# Patient Record
Sex: Female | Born: 1945
Health system: Southern US, Community
[De-identification: ages and names within clinical notes are randomized; demographics above are authoritative.]

## PROBLEM LIST (undated history)

## (undated) DIAGNOSIS — Z9849 Cataract extraction status, unspecified eye: Secondary | ICD-10-CM

## (undated) DIAGNOSIS — Z9889 Other specified postprocedural states: Secondary | ICD-10-CM

## (undated) DIAGNOSIS — I209 Angina pectoris, unspecified: Secondary | ICD-10-CM

## (undated) DIAGNOSIS — M1990 Unspecified osteoarthritis, unspecified site: Secondary | ICD-10-CM

## (undated) DIAGNOSIS — Z8719 Personal history of other diseases of the digestive system: Secondary | ICD-10-CM

## (undated) DIAGNOSIS — K5792 Diverticulitis of intestine, part unspecified, without perforation or abscess without bleeding: Secondary | ICD-10-CM

## (undated) DIAGNOSIS — K529 Noninfective gastroenteritis and colitis, unspecified: Secondary | ICD-10-CM

## (undated) DIAGNOSIS — F329 Major depressive disorder, single episode, unspecified: Secondary | ICD-10-CM

## (undated) DIAGNOSIS — S42309A Unspecified fracture of shaft of humerus, unspecified arm, initial encounter for closed fracture: Secondary | ICD-10-CM

## (undated) DIAGNOSIS — K219 Gastro-esophageal reflux disease without esophagitis: Secondary | ICD-10-CM

## (undated) DIAGNOSIS — Z923 Personal history of irradiation: Secondary | ICD-10-CM

## (undated) DIAGNOSIS — I219 Acute myocardial infarction, unspecified: Secondary | ICD-10-CM

## (undated) DIAGNOSIS — I10 Essential (primary) hypertension: Secondary | ICD-10-CM

## (undated) DIAGNOSIS — C50919 Malignant neoplasm of unspecified site of unspecified female breast: Secondary | ICD-10-CM

## (undated) DIAGNOSIS — D649 Anemia, unspecified: Secondary | ICD-10-CM

## (undated) DIAGNOSIS — E119 Type 2 diabetes mellitus without complications: Secondary | ICD-10-CM

## (undated) DIAGNOSIS — R011 Cardiac murmur, unspecified: Secondary | ICD-10-CM

## (undated) DIAGNOSIS — D219 Benign neoplasm of connective and other soft tissue, unspecified: Secondary | ICD-10-CM

## (undated) DIAGNOSIS — C801 Malignant (primary) neoplasm, unspecified: Secondary | ICD-10-CM

## (undated) DIAGNOSIS — F32A Depression, unspecified: Secondary | ICD-10-CM

## (undated) HISTORY — PX: EYE SURGERY: SHX253

## (undated) HISTORY — DX: Cataract extraction status, unspecified eye: Z98.49

## (undated) HISTORY — PX: COLON RESECTION: SHX5231

## (undated) HISTORY — DX: Other specified postprocedural states: Z98.890

## (undated) HISTORY — PX: HERNIA REPAIR: SHX51

## (undated) HISTORY — DX: Personal history of other diseases of the digestive system: Z87.19

## (undated) HISTORY — DX: Diverticulitis of intestine, part unspecified, without perforation or abscess without bleeding: K57.92

## (undated) HISTORY — PX: CHOLECYSTECTOMY: SHX55

## (undated) HISTORY — DX: Acute myocardial infarction, unspecified: I21.9

## (undated) HISTORY — DX: Personal history of irradiation: Z92.3

## (undated) HISTORY — DX: Anemia, unspecified: D64.9

## (undated) HISTORY — PX: APPENDECTOMY: SHX54

## (undated) HISTORY — DX: Unspecified fracture of shaft of humerus, unspecified arm, initial encounter for closed fracture: S42.309A

## (undated) HISTORY — DX: Noninfective gastroenteritis and colitis, unspecified: K52.9

## (undated) HISTORY — DX: Benign neoplasm of connective and other soft tissue, unspecified: D21.9

---

## 1997-09-01 ENCOUNTER — Encounter (HOSPITAL_COMMUNITY): Admission: RE | Admit: 1997-09-01 | Discharge: 1997-11-30 | Payer: Self-pay | Admitting: Cardiology

## 1997-09-13 ENCOUNTER — Other Ambulatory Visit: Admission: RE | Admit: 1997-09-13 | Discharge: 1997-09-13 | Payer: Self-pay | Admitting: Obstetrics & Gynecology

## 1997-10-21 ENCOUNTER — Ambulatory Visit (HOSPITAL_COMMUNITY): Admission: RE | Admit: 1997-10-21 | Discharge: 1997-10-21 | Payer: Self-pay | Admitting: Cardiology

## 1999-06-04 HISTORY — PX: TOTAL ABDOMINAL HYSTERECTOMY: SHX209

## 1999-06-11 ENCOUNTER — Encounter: Payer: Self-pay | Admitting: Family Medicine

## 1999-06-11 ENCOUNTER — Encounter: Admission: RE | Admit: 1999-06-11 | Discharge: 1999-06-11 | Payer: Self-pay | Admitting: Family Medicine

## 1999-06-25 ENCOUNTER — Ambulatory Visit (HOSPITAL_COMMUNITY): Admission: RE | Admit: 1999-06-25 | Discharge: 1999-06-25 | Payer: Self-pay | Admitting: Gastroenterology

## 1999-08-08 ENCOUNTER — Ambulatory Visit (HOSPITAL_COMMUNITY): Admission: RE | Admit: 1999-08-08 | Discharge: 1999-08-08 | Payer: Self-pay | Admitting: Gastroenterology

## 1999-08-08 ENCOUNTER — Encounter (INDEPENDENT_AMBULATORY_CARE_PROVIDER_SITE_OTHER): Payer: Self-pay | Admitting: Specialist

## 1999-10-15 ENCOUNTER — Encounter: Admission: RE | Admit: 1999-10-15 | Discharge: 1999-10-15 | Payer: Self-pay | Admitting: Gastroenterology

## 1999-10-15 ENCOUNTER — Encounter: Payer: Self-pay | Admitting: Gastroenterology

## 1999-10-17 ENCOUNTER — Encounter: Admission: RE | Admit: 1999-10-17 | Discharge: 1999-10-17 | Payer: Self-pay | Admitting: Gastroenterology

## 1999-10-17 ENCOUNTER — Encounter: Payer: Self-pay | Admitting: Gastroenterology

## 1999-11-12 ENCOUNTER — Other Ambulatory Visit: Admission: RE | Admit: 1999-11-12 | Discharge: 1999-11-12 | Payer: Self-pay | Admitting: Obstetrics and Gynecology

## 1999-11-13 ENCOUNTER — Encounter (INDEPENDENT_AMBULATORY_CARE_PROVIDER_SITE_OTHER): Payer: Self-pay

## 1999-11-13 ENCOUNTER — Other Ambulatory Visit: Admission: RE | Admit: 1999-11-13 | Discharge: 1999-11-13 | Payer: Self-pay | Admitting: Obstetrics and Gynecology

## 1999-11-20 ENCOUNTER — Encounter: Admission: RE | Admit: 1999-11-20 | Discharge: 1999-11-20 | Payer: Self-pay | Admitting: Obstetrics and Gynecology

## 1999-11-20 ENCOUNTER — Encounter: Payer: Self-pay | Admitting: Obstetrics and Gynecology

## 1999-12-04 ENCOUNTER — Encounter: Payer: Self-pay | Admitting: General Surgery

## 1999-12-11 ENCOUNTER — Encounter (INDEPENDENT_AMBULATORY_CARE_PROVIDER_SITE_OTHER): Payer: Self-pay | Admitting: Specialist

## 1999-12-11 ENCOUNTER — Inpatient Hospital Stay (HOSPITAL_COMMUNITY): Admission: RE | Admit: 1999-12-11 | Discharge: 1999-12-16 | Payer: Self-pay | Admitting: Obstetrics and Gynecology

## 2000-04-02 ENCOUNTER — Encounter: Payer: Self-pay | Admitting: Cardiology

## 2000-04-02 ENCOUNTER — Encounter: Admission: RE | Admit: 2000-04-02 | Discharge: 2000-04-02 | Payer: Self-pay | Admitting: Cardiology

## 2000-04-07 ENCOUNTER — Ambulatory Visit (HOSPITAL_COMMUNITY): Admission: RE | Admit: 2000-04-07 | Discharge: 2000-04-07 | Payer: Self-pay | Admitting: Cardiology

## 2001-07-13 ENCOUNTER — Encounter: Admission: RE | Admit: 2001-07-13 | Discharge: 2001-07-13 | Payer: Self-pay | Admitting: Obstetrics and Gynecology

## 2001-07-13 ENCOUNTER — Encounter: Payer: Self-pay | Admitting: Obstetrics and Gynecology

## 2001-08-03 ENCOUNTER — Ambulatory Visit (HOSPITAL_COMMUNITY): Admission: RE | Admit: 2001-08-03 | Discharge: 2001-08-03 | Payer: Self-pay | Admitting: Gastroenterology

## 2001-08-03 ENCOUNTER — Encounter (INDEPENDENT_AMBULATORY_CARE_PROVIDER_SITE_OTHER): Payer: Self-pay

## 2001-08-25 ENCOUNTER — Other Ambulatory Visit: Admission: RE | Admit: 2001-08-25 | Discharge: 2001-08-25 | Payer: Self-pay | Admitting: Obstetrics and Gynecology

## 2001-09-22 ENCOUNTER — Encounter: Admission: RE | Admit: 2001-09-22 | Discharge: 2001-09-22 | Payer: Self-pay | Admitting: Obstetrics and Gynecology

## 2001-09-22 ENCOUNTER — Encounter: Payer: Self-pay | Admitting: Obstetrics and Gynecology

## 2003-01-20 ENCOUNTER — Encounter: Admission: RE | Admit: 2003-01-20 | Discharge: 2003-01-20 | Payer: Self-pay | Admitting: Family Medicine

## 2003-01-20 ENCOUNTER — Encounter: Payer: Self-pay | Admitting: Family Medicine

## 2003-01-26 ENCOUNTER — Encounter: Admission: RE | Admit: 2003-01-26 | Discharge: 2003-01-26 | Payer: Self-pay | Admitting: Obstetrics and Gynecology

## 2003-01-26 ENCOUNTER — Encounter: Payer: Self-pay | Admitting: Obstetrics and Gynecology

## 2003-04-22 ENCOUNTER — Inpatient Hospital Stay (HOSPITAL_COMMUNITY): Admission: RE | Admit: 2003-04-22 | Discharge: 2003-04-26 | Payer: Self-pay | Admitting: General Surgery

## 2003-05-03 ENCOUNTER — Encounter: Admission: RE | Admit: 2003-05-03 | Discharge: 2003-08-01 | Payer: Self-pay | Admitting: Family Medicine

## 2003-08-04 ENCOUNTER — Encounter: Admission: RE | Admit: 2003-08-04 | Discharge: 2003-11-02 | Payer: Self-pay | Admitting: Family Medicine

## 2004-04-17 ENCOUNTER — Encounter: Admission: RE | Admit: 2004-04-17 | Discharge: 2004-04-17 | Payer: Self-pay | Admitting: Obstetrics and Gynecology

## 2005-11-14 ENCOUNTER — Encounter: Admission: RE | Admit: 2005-11-14 | Discharge: 2005-11-14 | Payer: Self-pay | Admitting: Obstetrics and Gynecology

## 2006-10-02 ENCOUNTER — Encounter: Admission: RE | Admit: 2006-10-02 | Discharge: 2006-10-02 | Payer: Self-pay | Admitting: Cardiology

## 2006-10-31 ENCOUNTER — Ambulatory Visit (HOSPITAL_COMMUNITY): Admission: RE | Admit: 2006-10-31 | Discharge: 2006-10-31 | Payer: Self-pay | Admitting: Cardiology

## 2007-10-13 ENCOUNTER — Encounter: Admission: RE | Admit: 2007-10-13 | Discharge: 2007-10-13 | Payer: Self-pay | Admitting: Family Medicine

## 2009-06-03 DIAGNOSIS — S42309A Unspecified fracture of shaft of humerus, unspecified arm, initial encounter for closed fracture: Secondary | ICD-10-CM

## 2009-06-03 HISTORY — DX: Unspecified fracture of shaft of humerus, unspecified arm, initial encounter for closed fracture: S42.309A

## 2010-06-03 DIAGNOSIS — Z9849 Cataract extraction status, unspecified eye: Secondary | ICD-10-CM

## 2010-06-03 HISTORY — DX: Cataract extraction status, unspecified eye: Z98.49

## 2010-10-16 NOTE — Cardiovascular Report (Signed)
Elizabeth Hebert, Elizabeth Hebert           ACCOUNT NO.:  [id number]   MEDICAL RECORD NO.:  [id number]          PATIENT TYPE:  OIB   LOCATION:  2899                         FACILITY:  [facility]   PHYSICIAN:  Thereasa Solo. Little, M.D. DATE OF BIRTH:  12-27-1945   DATE OF PROCEDURE:  DATE OF DISCHARGE:                            CARDIAC CATHETERIZATION   BRIEF HISTORY:  This 65 year old female had a stent placed to her RCA in  1999.  She had a nuclear study 12/2005, that was negative for ischemia  and showed a 72% ejection fraction.   Over the last 6-8 weeks, she has had an increase in her dyspnea on  exertion.  If she walks up a flight of steps, she becomes markedly  breathless and occasionally has some associated chest discomfort.  It  takes about 5 minutes for her recover from this and this is a  substantial change in what she had experienced in the past.  Because of  this, she is brought in for outpatient cardiac catheterization.   DESCRIPTION OF PROCEDURE:  After obtaining informed consent, the patient  was prepped and draped in the usual sterile fashion exposing the right  groin.  Following local anesthetic with were sent Xylocaine the  Seldinger technique employed and a 5-French introducer sheath was placed  into the right femoral artery.  Left and right coronary arteriography  and ventriculography in both the RAO and LAO projection was performed.   COMPLICATIONS:  None.   MEDICATIONS:  1 mg IV Versed.   EQUIPMENT:  5-French Judkins configuration catheters and it required a  __________ catheter to cannulate the right coronary artery.   RESULTS:  1. Hemodynamic monitoring central aortic pressure was 151/79.  Left      ventricular pressure was 151/9 with no significant valve gradient      at the time of pullback.   Ventriculography:  Ventriculography was performed at the procedure both  the RAO and LAO ventriculogram was performed.  In the RAO projection,  the apex appeared to contract  appropriately but there appeared to be a  lateral wall aneurysm.  The LAO view.  This area was not well  visualized.  This would be a new finding.   Coronary arteriography:  1. Left main normal, it bifurcated.  2. Circumflex, the circumflex gave rise to large OM which was free of      disease.  3. Left anterior descending -  The LAD extended down to the heart and      only minimal irregularities of the proximal mid portion there to      small diagonals that were free of disease.  4. Right coronary artery - the right coronary densely calcified.      There was a stent in the mid portion of the RCA that was widely      patent, proximal to the stent was a tubular area about 10 mm in      length of about 30%.  The PDA and posterior lateral vessels were      free of disease.   CONCLUSION:  1. Mild disease in the RCA was widely patent  stent.  2. In the lateral wall aneurysm.   She will need an outpatient echo to look at this abnormality.  I will  try to readjust her medicines as an outpatient.  Home later today.           ______________________________  Thereasa Solo. Little, M.D.     ABL/MEDQ  D:  10/31/2006  T:  10/31/2006  Job:  [id number]   cc:   Donia Guiles, M.D.

## 2010-10-19 NOTE — Op Note (Signed)
[facility]  Patient:    Elizabeth Hebert, Elizabeth Hebert                  MRN: [id number] Proc. Date: 12/11/99 Adm. Date:  [id number] Attending:  Oliver Pila                           Operative Report  PREOPERATIVE DIAGNOSIS:  Chronic diverticulitis, diverticulosis, enlarged uterus.  POSTOPERATIVE DIAGNOSIS:  Chronic diverticulitis, diverticulosis, enlarged uterus.  OPERATION PERFORMED:  Total abdominal hysterectomy and bilateral oophorectomy and sigmoid colectomy with take-down of splenic flexure and lower anterior anastomosis.  SURGEON:  Timothy E. Earlene Plater, M.D.  ASSISTANT:  Donnie Coffin. Samuella Cota, M.D.  ANESTHESIA:  CRNA supervised M.D.  INDICATIONS FOR PROCEDURE:  The patient has been seen by Dr. Vilinda Boehringer, Dr. Donia Guiles and Dr. Caprice Kluver.  He has been followed for some time known diverticulosis, frequent attacks of diverticulitis and now disabling diarrhea and then pelvic pain.  After careful consideration, she is scheduled for sigmoid colectomy.  Because of a 20+ week uterus, gynecologic consultation has been obtained and hysterectomy is planned.  DESCRIPTION OF PROCEDURE:  The patient had been brought to the operating room by Dr. Huel Cote.  General endotracheal anesthesia had been administered and the patient underwent a total abdominal hysterectomy and bilateral salpingo-oophorectomy as described and noted by Dr. Senaida Ores.  At the completion of that procedure at approximately 9:30 I was called into the room.  The hysterectomy was complete and Dr. Senaida Ores was finished.  At that time the patient was stable and the counts were correct.  I enlarged the lower midline incision to just above the umbilicus, reapplied the abdominal retractors.  A preliminary evaluation of the upper abdomen revealed adhesions of the omentum to the right upper quadrant.  The liver was palpably free of disease.  The stomach was palpably normal.  The  nasogastric tube was rearranged.  The small bowel was normal in its entirely.  The right colon, transverse colon was thought to be normal as best could be told.  The descending colon was normal to the junction of the descending and sigmoid where diverticulosis was present and the sigmoid colon was coiled and adherent to the pelvis and very erythematous, very thickened, even felt like there was a tumor within the sigmoid colon and the pelvis.  The uterus, of course, had just been removed.  We began by dividing the peritoneum over the descending colon and sigmoid colon.  The ureters on both right and left sides were carefully identified and were well out of the operative field.  Blunt dissection was used to free the inflamed colon out of the pelvis and the upper rectum was felt to be free of disease.  With some difficulty, the mesentery was bluntly dissected.  Bleeding was carefully controlled with either ties, clips or cautery.  At the junction of the descending and sigmoid colon, the colon was divided between the GIA staple device.  We then used the distal sigmoid and then carefully divided the mesentery down to the upper rectum where the remaining involved and diseased sigmoid was divided from the rectum. The rectum, indeed was normal both by mucosal inspection and external inspection.  Bleeding had been controlled satisfactorily.  We did find one area on the vaginal cuff that was bleeding a small amount and we placed a figure-of-eight suture of #1 Vicryl.  Then we approached the descending colon and splenic flexure by  careful sharp and blunt dissection and took down the entire splenic flexure to allow for length of descending colon to reach the pelvis.  This was done again with some difficulty but we were able to manage and keep the entire colon intact, blood supply intact and the descending colon reached the pelvis without tension.  One area on the splenic flexure which had been touched  with the cautery was identified and imbricating sutures of 3-0 silk were applied to turn in that serosa.  We then began our anastomosis.  It was a handsewn one layer end-to-end anastomosis between the descending colon and rectum.  This was accomplished with 2-0 silks, carefully placed along the back row than all tied and then the front row and sides were sewn and sutured.  This appeared to be intact, air and watertight anastomosis.  It lay well down into the pelvis and was without tension.  There was some spillage of liquid stool.  This was copiously irrigated away.  The area was bathed in Betadine solution and then that was diluted and washed away as well.  The mesentery medially was closed with interrupted silk sutures.  The counts were correct.  The pelvis was irrigated with 3L of saline.  The viscera and omentum were replaced across the midabdomen.  Again, the counts were correct and the abdomen was closed in a single layer with #1 PDS suture.  The subcu, generous as it was was irrigated with 1L of saline and the skin was closed with wide skin staples.  Again, second counts and final counts were correct.  The patient was stable.  Total estimated blood loss was 500 cc.  None replaced and the patient was stable. Then an epidural was applied for postoperative anesthesia.  The patient was removed to recovery room in good condition. DD:  12/11/99 TD:  12/11/99 Job: [id number]

## 2010-10-19 NOTE — Procedures (Signed)
[facility]  Patient:    Elizabeth Hebert                   MRN: [id number] Proc. Date: 06/25/99 Adm. Date:  [id number] Attending:  Orland Mustard Dictator:   Llana Aliment Randa Evens, M.D. CC:         Desma Maxim, M.D.                           Procedure Report  PROCEDURE:  Colonoscopy with polypectomy and biopsy.  MEDICATIONS: 1. Fentanyl 75 mcg. 2. Versed 7.5 mg intravenously.  INDICATIONS:  The patient is a 65 year old woman with chronic diarrhea that has  been bloody going back to approximately two months with increased white count. She has had nocturnal stools.  She has a family history of Crohns disease.  Due to change in bowel habits, grossly bloody stools, and her age, colonoscopy is performed.  SCOPE:  Olympus adult video colonoscope, changing to the Olympus pediatric video colonoscope.  DESCRIPTION OF PROCEDURE:  The patient was explained to the patient and consent  obtained.  The patient was placed in the left lateral decubitus position.  The Olympus adult video colonoscope was inserted under direct visualization.  The patient had an obvious polyp in the rectum.  We advanced beyond that and there as an area of marked inflammation and edema that I was unable to pass with the adult scope.  We then switched over to the Olympus pediatric video colonoscope. Eventually, after placing her on her back, we were able to pass this area.  We ere able to advance fairly rapidly to the cecum.  The ileocecal valve seen, appeared normal.  The terminal ileum entered for a short distance and appeared normal. Scope withdrawn.  The cecum, ascending colon, hepatic flexure, transverse colon, splenic flexure, and descending colon seen well.  There was scattered diverticuli throughout the entire colon.  The descending colon, mid-descending colon there as an inflamed ulcerated area that lasted for approximately 10 cm.  It was localized, had  linear ulcerations, marked inflammation and edema.  This was biopsied and placed in jar #1.  It had the appearance of a focal colitis.  It did not appear to be associated with diverticuli.  There again was scattered diverticuli throughout the remainder of the descending and sigmoid colon to approximately 25 cm, at which point an ulcerated inflamed hot mass was seen that could have been polypoid, but the tip actually appeared to be a large polyp, but seemed to enter down into the stalk, and the stalk was broad-based, if in fact it was truly on the stalk.  It was hard and very friable.  It was not at all a typical pedunculated polyp.  I elected to biopsy the tip, and the tip again was hard and quite friable.  This was obliterating the lumen partially.  I contemplated attempting to remove this with the snare, but it seemed like the process involved what would have been the stalk down to the stalk, and I was not sure if we were dealing with inflammatory bowel disease or a neoplastic lesion.  The scope was withdrawn, and the rectosigmoid junction was endoscopically normal.  In the distal rectum a 1.5 cm sessile polyp was encountered, removed with the snare, and sucked up against the scope. There were internal hemorrhoids.  The scope was withdrawn.  The patient tolerated the  procedure well.  Maintained on  low flow oxygen and pulse oximetry without any obvious problem.  ASSESSMENT: 1. Sigmoid colon mass, probable cancer, could be an inflammatory polypoid area. 2. Focal colitis in the descending colon, questionable Crohns disease versus  diverticulitis. 3. Large rectal polyp removed.  PLAN: 1. Keep the patient on clear liquids and scrambled eggs for the next several days.    We will check the pathology report from the part that was removed. 2. Will continue on Cipro and we will add Flagyl. 3. We will touch base with her by phone after all of this information is returned.DD:   06/25/99 TD:  06/26/99 Job: [id number]

## 2010-10-19 NOTE — Discharge Summary (Signed)
[facility]  Patient:    Elizabeth Hebert, Elizabeth Hebert                  MRN: [id number] Adm. Date:  [id number] Disc. Date: [id number] Attending:  Oliver Pila                           Discharge Summary  DISCHARGE DIAGNOSES: 1. Chronic severe diverticulitis of the sigmoid colon. 2. Fibroid uterus. 3. Retained intrauterine device. 4. Status post resection of sigmoid colon with reanastomosis done    by Dr. Lorelee New. 5. Status post total abdominal hysterectomy, bilateral salpingo-oophorectomy    done by Dr. Huel Cote.  DISCHARGE MEDICATIONS:  1. Tylox 1-2 tablets p.o. q.4h. p.r.n.  2. Advil p.r.n.  3. Toprol XL  4. Cozaar.  5. Zoloft.  6. Axid.  7. ______  8. Hydrochlorothiazide.  9. Nitrostat. 10. Asacol.  DISCHARGE FOLLOW-UP:  The patient was discharged by Dr. Lorelee New to follow-up in approximately two to three weeks with him, and she is to follow-up with Dr. Senaida Ores in approximately two weeks for an incision check.  HOSPITAL COURSE:  The patient is a 65 year old G2, P2, who presented for a scheduled total abdominal hysterectomy and partial resection of her sigmoid colon.  She underwent total abdominal hysterectomy and bilateral salpingo-oophorectomy as well as a resection of her sigmoid colon and reanastomosis on December 11, 1999 and was then admitted for routine postoperative care.  The patient did very well and recovered quickly.  Postoperative hemoglobin was 9.7.  She was continued n.p.o. as per Dr. Earlene Plater until postoperative day #4 when she began to pass small stools.  She was then advanced to a clear diet.  On postoperative day #5, the patient was afebrile with stable vital signs.  She had good p.o. intake.  Her belly was soft and nontender.  She had had two bowel movements and was transitioned to p.o. pain medicines.  She did very well and was discharged to home to follow-up in two to three weeks with Dr. Earlene Plater.  DISCHARGE  STATUS:  Improved. DD:  12/26/99 TD:  12/27/99 Job: [id number]

## 2010-10-19 NOTE — Discharge Summary (Signed)
Elizabeth Hebert, Elizabeth Hebert                     ACCOUNT NO.:  [id number]   MEDICAL RECORD NO.:  [id number]                   PATIENT TYPE:  INP   LOCATION:  0376                                 FACILITY:  [facility]   PHYSICIAN:  Timothy E. Earlene Plater, M.D.              DATE OF BIRTH:  09/12/1945   DATE OF ADMISSION:  04/22/2003  DATE OF DISCHARGE:  04/26/2003                                 DISCHARGE SUMMARY   FINAL DIAGNOSES:  Ventral incisional hernia.   SECONDARY DIAGNOSES:  1. Coronary artery disease.  2. Diabetes mellitus.   HISTORY:  The patient was seen and counseled preoperatively for an enlarging  and symptomatic ventral incisional hernia and after considerable discussion  and consultation she wished to proceed with repair.  This was planned to be  laparoscopic.  The patient had been seen and evaluated by her primary care  and cardiologist preoperatively.  She was brought into the hospital on the  day of surgery and underwent a laparoscopic ventral incisional hernia  repair.  She was managed in the usual postoperative fashion with sliding  scale insulin and resumption of heart medications.  However, because of  difficulty with control of the diabetes, we asked [facility] hospitalists to  assist and they did come by and provide that service.  Also, she was seen by  representatives from [facility] for Dr. Clarene Duke and they  followed the patient as well.  The patient stabilized.  Her blood sugars  were monitored and controlled and finally on November 23 she was ready for  discharge.  The [facility] hospitalists took care of her diabetic medications.  Her cardiac medications were unchanged.  Wound care was minimal and patient  was fully ambulatory with resumption of GI function.                                               Timothy E. Earlene Plater, M.D.    TED/MEDQ  D:  05/25/2003  T:  05/25/2003  Job:  [id number]   cc:   Thereasa Solo. Little, M.D.  [address]  [address]   [place]  [place] [address]  Fax: [phone]   Donia Guiles, M.D.  [address] [place]  [place] [address]  Fax: [phone]

## 2010-10-19 NOTE — Op Note (Signed)
NAME:  Elizabeth Hebert, Elizabeth Hebert                     ACCOUNT NO.:  [id number]   MEDICAL RECORD NO.:  [id number]                   PATIENT TYPE:  INP   LOCATION:  0004                                 FACILITY:  [facility]   PHYSICIAN:  Elizabeth Hebert, M.D.              DATE OF BIRTH:  09/02/1945   DATE OF PROCEDURE:  04/22/2003  DATE OF DISCHARGE:                                 OPERATIVE REPORT   PREOPERATIVE DIAGNOSIS:  Ventral incisional hernia.   POSTOPERATIVE DIAGNOSIS:  Ventral incisional hernia.   PROCEDURE:  Laparoscopic repair of ventral incisional hernia.   SURGEONS:  Abigail Miyamoto, M.D. and Sheppard Plumber. Earlene Hebert, M.D.   ANESTHESIA:  General.   INDICATIONS FOR PROCEDURE:  Elizabeth Hebert is well known to me, she is now 57  although she appears older. She does have significant obesity,  hyperlipidemia, newly discovered diabetes mellitus, coronary artery disease  and hypertension. She had previously had an open cholecystectomy via a right  paramedian incision, a sigmoid resection and a hysterectomy via midline  incisions. She has a painful and enlarging ventral incisional hernia around  the umbilicus and because she is seriously trying to lose weight and  exercise the hernia interferes with this and she has elected to proceed with  this repair. She has been cleared by her cardiologist, Thereasa Solo. Little,  M.D. and will be seen by the [facility] hospitalist in regards to newly diagnosed  diabetes. She is identified and a permit signed, evaluated by anesthesia.   The patient was taken to the operating room, placed supine, general  endotracheal anesthesia administered. A Foley catheter, PAS hose applied.  The abdomen was prepped and draped in the usual fashion. We approached the  laparoscopic repair by performing an open approach in the left mid quadrant.  We were able to reach the abdominal cavity and place a Hasson catheter which  was tied in place, the abdomen insufflated nicely. The  adhesions were dense  at our approach site but we  were able to see the free abdominal cavity.  There was a space in the right upper quadrant and we placed a 5 mm trocar  there under direct vision and were able to take down some of the adhesions  near the left upper quadrant. We applied a second 5 mm trocar in the left  lower quadrant under direct vision and after further blunt dissection a 5 mm  trocar was placed in the right lower quadrant under direct vision. With this  and with positioning of the patient and palpation of the hernia defect, we  were able to bluntly take down all of the omental adhesions, there was a  loop of apparently transverse colon incarcerated in the hernia defect but we  were able to bluntly dissect that off from the hernia defect and it was then  completely clear the entire abdominal wall was free. We then measured the  hernia by palpation on the outside,  visualization on the inside, marked  that, allowed another 5 cm around that and then prepared a Parietex 16 x 20  mesh, marked it, applied sutures, wet it, rolled it, placed it through the  trocar, placed it into the abdominal cavity, unrolled it, properly  positioned it and then using percutaneous technique, we brought the  previously placed sutures to the outside of each of the four quadrants and  then by pulling those up and tying those this placed the mesh against the  anterior abdominal wall, its position and orientation were satisfactory.  Then using the protacker, the remainder of the securing of the mesh was  accomplished by tacking it around its periphery. We were well satisfied with  the placement. We again viewed the bowel and the abdominal contents, we did  not see any excess bleeding and the dissected colon seemed fine. So with  this the procedure was complete, the abdomen was after inspection all  trocars removed, the gas was released and the  abdominal wall fell back into place. The skin was all  inspected, the  puncture sites were inspected and secured and then the skin was closed with  4-0 Monocryl. Steri-Strips applied to all skin incisions. The patient was  stable and was awakened and taken to the recovery room in good condition.                                               Elizabeth Hebert, M.D.    TED/MEDQ  D:  04/22/2003  T:  04/22/2003  Job:  [id number]   cc:   Donia Guiles, M.D.  [address] [place]  [place] [address]  Fax: [phone]

## 2010-10-19 NOTE — Op Note (Signed)
[facility]  Patient:    Elizabeth Hebert, Elizabeth Hebert                  MRN: [id number] Proc. Date: 12/11/99 Adm. Date:  [id number] Attending:  Oliver Pila                           Operative Report  PREOPERATIVE DIAGNOSES: 1. Fibroid uterus, approximately 20 week size. 2. Irregular uterine bleeding. 3. Retained intrauterine device. 4. Chronic diverticulitis and inflammatory colitis.  POSTOPERATIVE DIAGNOSES: 1. Fibroid uterus, approximately 20 week size. 2. Irregular uterine bleeding. 3. Retained intrauterine device. 4. Chronic diverticulitis and inflammatory colitis. 5. Pelvic adhesions of sigmoid colon to the adnexa, uterus and pelvic    sidewalls.  OPERATION:  Total abdominal hysterectomy, bilateral salpingo-oophorectomy.  SURGEON:  Alvino Chapel, M.D., Zenaida Niece, M.D.  ANESTHESIA:  General.  FLUIDS:  Estimated blood loss 250 cc, urine output approximately 200 cc, IV fluids approximately 1800 cc LR.  FINDINGS:  Enlarged uterus, approximately 20 week size, also both pelvic and adnexal adhesions of the sigmoid colon.  Normal ovaries and tubes noted bilaterally apart from the adhesions.  The sigmoid colon itself appeared indurated and inflamed with dense adhesions to the pelvic sidewall, as well as the posterior uterine surface.  DESCRIPTION OF OPERATION:  The patient was taken to the operating room and general anesthesia was obtained without difficulty.  She was then prepped and drape in the normal sterile fashion in the dorsal supine position.  A vertical midline incision was then made with the scalpel and carried through to the underlying layer of fascia with Bovie cautery.  The fascia was then opened in the midline and extended superiorly and inferiorly with Mayo scissors. Attention was then turned to the rectus muscles which were separated in the midline and the peritoneum identified, grasped with hemostats x 2 and  entered with Metzenbaum scissors.  Peritoneal incision was extended both superiorly and inferiorly _______ bowel and bladder.  Upon entering the abdominal cavity, the pelvic findings were as previously stated with a markedly enlarged uterus, as well as ___________ noted in the right upper quadrant from her prior cholecystomy.  The uterus was then delivered from the abdomen and the bowel packed away with moist laparotomy sponges with the patient placed in Trendelenburg.  The Balfour retractor was placed through the incision and bladder blade as well.  Attention was then turned to the patients right, where the round ligament was transected with Bovie cautery and peritoneal space opened.  This retroperitoneal space was opened to expose the pelvic ligament which was isolated and clamped with a slightly curved Zeppelins clamp.  This was transected and both the free tie and suture ligature of 0 Vicryl placed around it.  The bladder flap was then developed with Bovie cautery to the midline of the uterus and the uterine artery skeletonized.  The uterine artery was then clamped with slightly curved Zeppelins clamped, transected and suture ligated.  Additionally, the cardinal ligament was sequentially with a straight Zeppelins clamp, transected and suture ligated with 0 Vicryl down to the level of the cervix.  Attention was then turned to the patients left.  On the left there were dense adhesions of the sigmoid to the posterior uterine and cervical surface.  Therefore, these were taken down bluntly with good success. The adnexal region and the cervical region were then cleared of bowel and the round ligament was then transected with  Bovie cautery.  The retroperitoneal space was opened on the left as well and infundibula pelvic ligament was clearly isolated and a curved Zeppelins clamp placed on it.  This was then transected and both suture ligated and free tied with a 0 Vicryl.  Uterine artery on the  left then additionally skeletonized, clamped with a curved Zeppelins clamp and suture ligated.  The remainder of the cardinal ligament on the right was then transected with slightly curved Zeppelins clamp, which was placed at the level of the external cervical os and the clamp left in place. The cardinal ligament on the left was then sequentially transected and clamped with straight Zeppelins clamps with suture ligature of 0 Vicryl placed at each step.  This was carried through to the level of the external cervical os.  The clamp across the vaginal cuff on the right was then transected and the vaginal cuff entered.  The uterus was then further amputated with the Jorgensens scissors and Coker clamps placed on the remaining vaginal cuff.  The vaginal cuff was then closed with 0 Vicryl in several interrupted figure of eight sutures for good hemostasis.  At this point the uterine specimen was passed off to pathology and the entire pelvis irrigated.  The cuff was inspected carefully and found to be hemostatic and the bladder flap had one small area of bleeding, which was easily controlled with Bovie cautery.  At this point, Dr. Earlene Plater was notified.  Our portion of the procedure was completed and scrubbed in to complete the colon resection. DD:  12/11/99 TD:  12/11/99 Job: [id number]

## 2010-10-19 NOTE — H&P (Signed)
[facility]  Patient:    Elizabeth Hebert, Elizabeth Hebert                    MRN: [id number] Attending:  Alvino Chapel, M.D.                         History and Physical  HISTORY OF PRESENT ILLNESS:  The patient is a 65 year old G2, P2 who presents for a scheduled total abdominal hysterectomy and partial resection of her sigmoid colon. The patient had a long history of irregular bleeding and spotting; however, has not had menstrual flow in approximately 2-3 years. The patient does have a retained IUD which has been in place for approximately 26 years and tested positive for Actinomyces for which she was treated with penicillin. There is some urinary frequency which is associated with a rather enlarged uterus approximately 18-20 week size. The patient has had significant problems with severe diverticulitis with diarrhea, dehydration and off and on hospitalizations. Given the large size of her uterus associated with urinary frequency and the need to perform a partial colectomy, it was planned to proceed with a total abdominal hysterectomy and bilateral salpingo-oophorectomy concurrently.  PAST MEDICAL HISTORY:  Severe diverticulitis as stated treated by Dr. Kendrick Ranch. The patient also has a history of coronary artery disease with an MI in 1999 which was treated with stent placement x 2 and a history of borderline hypertension.  PAST GYN HISTORY:  The patient had a normal Pap smear June 2001.  PAST OBSTETRICAL HISTORY:  She has had 2 normal spontaneous vaginal deliveries.  PAST SURGICAL HISTORY:  Stent placement x 2 as stated as well as a cholecystectomy.  ALLERGIES:  VASOTEC.  MEDICATIONS:  Toprol XL, cozaar, Zoloft, aspirin, Axid, Triam, hydrochlorothiazide, Nitrostat and Asacol.  FAMILY HISTORY:  Significant only for a grandmother with breast cancer. Mammogram prior to surgery was within normal limits.  PHYSICAL EXAMINATION:  VITAL SIGNS:  The  patient is normotensive at 122/80.  BREASTS:  Normal with no masses, discharge or adenopathy noted.  HEART:  Regular rate and rhythm.  LUNGS:  Clear to auscultation bilaterally.  ABDOMEN:  Soft and nontender.  PELVIC:  The patient had a cervix noted with some bloody discharge. The uterus sounds to approximately 16 cm. Endometrial biopsy was done and within normal limits. The uterus was approximately 18-20 week size, right side being greater than left. Rectal exam was deferred secondary to complete gastrointestinal workup with Dr. Earlene Plater.  IMPRESSION:  A large fibroid uterus and severe diverticulitis for which the patient will undergo a total abdominal hysterectomy and bilateral salpingo-oophorectomy followed by partial sigmoid colectomy. Also history of irregular bleeding and retained IUD. The patient was counseled for the hysterectomy and all risks and benefits were discussed with the patient including increased chance of bleeding and infection. The patient understands there is a possibility of damage to bowel and bladder also with the hysterectomy. Given the symptoms and size of the fibroids as well as the possibility it would interfere with her colectomy, we will proceed with a total abdominal hysterectomy, bilateral salpingo-oophorectomy which will be followed by partial colectomy by Dr. Earlene Plater. The patient understands all risks and benefits and desires to proceed with informed consent. DD:  12/10/99 TD:  12/10/99 Job: [id number]

## 2010-10-19 NOTE — Cardiovascular Report (Signed)
[place]  Patient:    Elizabeth Hebert, Elizabeth Hebert                  MRN: [id number] Adm. Date:  [id number] Disc. Date: [id number] Attending:  Loreli Dollar                        Cardiac Catheterization  INDICATIONS FOR TEST:  Ms. Szostak is a 65 year old female who had two stents placed in her right coronary artery in 1999.  She presented with forcible heartbeats, and a standard nuclear study raised a concern of some mild inferior ischemia and some anterior ischemia.  Because of this, she is brought in for outpatient cardiac catheterization.  PROCEDURES: 1. Left heart catheterization. 2. Selective right and left coronary arteriography. 3. Ventriculography in the RAO projection.  EQUIPMENT USED:  Judkins configuration catheters, 6-French.  DESCRIPTION OF PROCEDURE:  The patient was prepped and draped in the usual sterile fashion exposing the right groin.  Following local anesthetic of 1% Xylocaine, the Seldinger technique was employed, and a 6-French introducer sheath was placed into the right femoral artery.  Selective right and left coronary arteriography and ventriculography in the RAO projection were performed.  COMPLICATIONS:  None.  RESULTS: 1. Hemodynamic monitoring:  Central aortic pressure 151/85.  Left ventricular    pressure 154/27 with no significant aortic valve gradient noted at the time    of pullback. 2. Ventriculography:  Ventriculography in the RAO projection revealed normal    left ventricular systolic function.  Ejection fraction was greater than    60%, and the end diastolic pressure was 21. 3. Coronary arteriography:  Mild calcification in the LAD was noted on    fluoroscopy as were two stents in the RCA.    a. Left main:  Normal.    b. LAD:  The LAD extended down to the apex of the heart and had marked       tortuosity.  There was a very small first diagonal which had some       proximal irregularities not amenable to  intervention because of the       small size.    c. Diagonal #2 and diagonal #3 were large vessels free of disease.  In       the mid portion of the LAD was some mild irregularities.    d. Circumflex:  The circumflex gave rise to one large OM and one small OM.       This entire system was free of disease.    e. RCA:  The stent in the mid portion of the RCA is widely patent.  The       stent in the distal portion is widely patent.  The posterior descending       artery and the posterolateral arteries are free of disease.  Between the       two stents is some mild 30% irregularities.  The proximal vessel is free       of disease.  CONCLUSIONS: 1. Normal left ventricular systolic function. 2. Mild irregularities in the RCA with no evidence of restenosis within the    stents.  Mild irregularities in a very small trivial high proximal first    diagonal, not amenable to intervention.  At this point, would continue her on medical therapy.  I suspect the nuclear study was, in fact, more false positive.DD:  04/07/00 TD:  04/07/00 Job: [id number]

## 2010-10-19 NOTE — Consult Note (Signed)
NAME:  Elizabeth Hebert, Elizabeth Hebert                     ACCOUNT NO.:  [id number]   MEDICAL RECORD NO.:  [id number]                   PATIENT TYPE:  INP   LOCATION:  0004                                 FACILITY:  [facility]   PHYSICIAN:  Jackie Plum, M.D.             DATE OF BIRTH:  1946-05-24   DATE OF CONSULTATION:  04/22/2003  DATE OF DISCHARGE:                                   CONSULTATION   REFERRING PHYSICIAN:  Timothy E. Earlene Plater, M.D.   REASON FOR CONSULTATION:  Hypotension and hyperglycemia.   IMPRESSION:  1. Acute hypotension, postoperative.  2. Hyperglycemia, rule out diabetes mellitus.  3. Hyponatremia, probably pseudohyponatremia.  4. History of coronary artery disease, status post myocardial infarction six     years ago, status post stenting.  5. History of hypertension and dyslipidemia.   RECOMMENDATIONS:  1. The patient should be transferred to the ICU from the PACU today if it is     okay with the primary care physician, Dr. Earlene Plater.  2. The patient is currently receiving a bolus of 1 L IV normal saline, and     she is in Trendelenburg position and should continue this.  3. Her blood pressure should be monitored carefully.  4. She should be started on normal saline at 220 mL/hr. and subsequently     reduced to 150 mL/hr. after 2 L and M.D. notified if blood pressure is     lower than 90 systolic.  5. Will obtain cardiac enzymes, two sets x3, to rule out cardiac ischemia in     this patient who seems to have diabetes mellitus.  6. A 12-lead EKG should be repeated in the morning.  7. A hemoglobin A1C should be obtained with chest x-ray and fasting lipid     panel.  8. Will also start her on sliding scale insulin with thin sensitivity     schedule and the schedule will be changed subsequently to moderately     sensitivity as she starts to optimize her p.o. intake.  9. The patient should ultimately receive some evaluation to rule out     diabetes with fasting blood glucose  level when she is over this acute     stress of her hospitalization.  10.      Will get a fasting lipid panel prior to her discharge from the     hospital if not obtained recently.   COMMENT:  The patient is a 65 year old Caucasian lady with history of  coronary artery disease, status post myocardial infarction six years ago  with stenting.  She also has a history of hypertension and dyslipidemia and  a remote history of cigarette smoking.  The patient was admitted to the  hospital and had a laparoscopic repair of her ventral hernia by Dr. Earlene Plater  this morning.  Her preoperative management included a stress test which was  reported on April 19, 2003, to reveal scintigraphic evidence of  inferolateral scar  without evidence of ischemia.  She was therefore assessed  to be low risk for cardiac ischemia perioperatively.   In addition, her preoperative evaluation included a CBC which showed mild  __________ of 15.6 for hemoglobin with some hyponatremia of 128, borderline  hypokalemia of 3.4, glucose of 341 mg/dl, and mildly elevated SGOT of 42  without any hyperbilirubinemia or other associated liver function  abnormalities.  Her urinalysis done the same day was unremarkable except for  glucosuria of more than 1000 mg/dl.   We were asked to see her in the PACU this afternoon by Dr. Earlene Plater on a  consult for hypotension with hyperglycemia.  The patient is currently  receiving a bolus of IV normal saline 1 L bag, and she is in Trendelenburg  position.  She denies any chest pain, shortness of breath, dizziness, or  diaphoresis.  She has felt a bit nauseated earlier but this resolved at the  time of my evaluation.  Essentially she does not have any significant  cardiopulmonary symptomatology.  She is denying any prior history of  diabetes mellitus.  Apparently there is no history of diabetes in her family  as well.  It is not clear whether the patient has a history of heart  failure, but she is  on Lanoxin 125 mcg daily.  She also denies any fever or  chills.   MEDICATION HISTORY:  Medication history was reviewed with the patient.  The  patient is apparently intolerant to VASOTEC, which caused hives and itching.   Medications prior to admission included:  1. Lanoxin 0.125 mg p.o. daily.  2. Hyzaar one tablet daily.  3. Potassium chloride 20 mEq daily.  4. Toprol XL 50 mEq half-tablet q.a.m.  5. Aspirin 81 mg p.o. daily.  6. Axid 75 mg daily.  7. Zoloft.  8. __________ with Lipitor.   SOCIAL HISTORY:  She does not smoke cigarettes.   PHYSICAL EXAMINATION:  GENERAL:  The patient is not in acute cardiopulmonary  distress.  She is in Trendelenburg position as mentioned above.  VITAL SIGNS:  Her blood pressure was 84/38 mmHg with a pulse rate of 60 per  minute, respiratory rate of 15 per minute.  Saturation is 97% on 2 L of  oxygen by nasal cannula.  She was afebrile.  HEENT:  She was not pale, not icteric.  NECK:  Supple.  CHEST:  Lung exam notable for some few rhonchi with adequate air entry.  CARDIAC:  Regular rate with few extrasystole.  She has a 3/6 lower left  sternal border systolic murmur which was nonradiating.  ABDOMEN:  Strapped postoperatively and did not seem to be tender.  EXTREMITIES:  She did not have any edema.  NEUROLOGIC:  She was alert and oriented x3.   LABORATORY DATA:  Twelve-lead EKG was notable for sinus rhythm without any  acute ST wave changes in contiguous leads.  I reviewed her telemetry strips,  which showed some few PVCs, otherwise she was in sinus rhythm.   ASSESSMENT:  Postoperative acute hypotension.  Differentials would include  from medications.  The patient is currently asymptomatic, of course she is  in Trendelenburg position.  She seems to be responding to the normal saline  bolus.  Would recommend above recommendations and follow the patient  carefully.  I have called Dr. Clarene Duke, who is the patient's cardiologist, and he will be  coming by to see the patient.  I described my plan of care with  Dr. Clarene Duke at this point.  We will continue to hold her antihypertensives  and obtain a basic metabolic panel in addition to the above-requested  laboratory work.   Thank you for this consultation.  Will follow up with you.                                               Jackie Plum, M.D.    GO/MEDQ  D:  04/22/2003  T:  04/22/2003  Job:  [id number]   cc:   Tish Frederickson. Earlene Plater, M.D.  [address]  [place]  [place] [address]  Fax: [phone]   Donia Guiles, M.D.  [address] [place]  [place] [address]  Fax: [phone]   Nanetta Batty, M.D.  Fax: [phone]   Thereasa Solo. Little, M.D.  [address]  [address]  [place]  [place] [address]  Fax: [phone]

## 2010-10-19 NOTE — Procedures (Signed)
[facility]  Patient:    Elizabeth Hebert, Elizabeth Hebert Visit Number: [id number] MRN: [id number]          Service Type: END Location: ENDO Attending Physician:  Orland Mustard Dictated by:   Llana Aliment. Randa Evens, M.D. Proc. Date: 08/03/01 Admit Date:  08/03/2001   CC:         Desma Maxim, M.D.  Timothy E. Earlene Plater, M.D.   Procedure Report  DATE OF BIRTH:  [birth year]  PROCEDURE:  Colonoscopy and polypectomy.  MEDICATIONS:  Fentanyl 75 mcg, Versed 7 mg IV.  INDICATIONS FOR PROCEDURE:  A nice 66 year old woman whose had a long history. She had a previous colonoscopy with adenomatous polyps removed and this is done as a follow-up for that. She also had severe diverticulitis and ultimately required resection of her sigmoid colon. The path fortunately showed only diverticulitis but showed no evidence of malignancy. The patients done much better since that procedure. This is done as a follow-up to rule out any further polyps.  DESCRIPTION OF PROCEDURE:  The procedure had been explained to the patient and consent obtained. With the patient in the left lateral decubitus position, the Olympus pediatric video colonoscope was inserted and advanced under direct visualization. The prep was quite good. The patient still had pandiverticular disease of the sigmoid colon. There was no narrowing or active diverticulitis. There was a 1/2 cm polyp that was sessile in the cecum that was removed and sucked through the scope with no active bleeding. The scope withdrawn, no other polyp seen, pandiverticular disease of the colon. In the area of the sigmoid was a scarring from her previous surgery. The scope was withdrawn. The patient tolerated the procedure well and was maintained on low flow oxygen and pulse oximeter throughout the procedure.  ASSESSMENT:  1. Cecal polyp removed.  2. Pandiverticulosis without any evidence of active diverticulitis.  PLAN:  Will  recommend repeating in three years. Dictated by:   Llana Aliment. Randa Evens, M.D. Attending Physician:  Orland Mustard DD:  08/03/01 TD:  08/03/01 Job: [id number]

## 2011-06-11 DIAGNOSIS — Z01419 Encounter for gynecological examination (general) (routine) without abnormal findings: Secondary | ICD-10-CM | POA: Diagnosis not present

## 2011-07-08 DIAGNOSIS — E1165 Type 2 diabetes mellitus with hyperglycemia: Secondary | ICD-10-CM | POA: Diagnosis not present

## 2011-07-08 DIAGNOSIS — I10 Essential (primary) hypertension: Secondary | ICD-10-CM | POA: Diagnosis not present

## 2011-07-08 DIAGNOSIS — E1139 Type 2 diabetes mellitus with other diabetic ophthalmic complication: Secondary | ICD-10-CM | POA: Diagnosis not present

## 2011-07-08 DIAGNOSIS — E11359 Type 2 diabetes mellitus with proliferative diabetic retinopathy without macular edema: Secondary | ICD-10-CM | POA: Diagnosis not present

## 2011-07-08 DIAGNOSIS — F325 Major depressive disorder, single episode, in full remission: Secondary | ICD-10-CM | POA: Diagnosis not present

## 2011-07-08 DIAGNOSIS — E7800 Pure hypercholesterolemia, unspecified: Secondary | ICD-10-CM | POA: Diagnosis not present

## 2011-08-31 DIAGNOSIS — J209 Acute bronchitis, unspecified: Secondary | ICD-10-CM | POA: Diagnosis not present

## 2011-08-31 DIAGNOSIS — J019 Acute sinusitis, unspecified: Secondary | ICD-10-CM | POA: Diagnosis not present

## 2011-10-09 DIAGNOSIS — E7800 Pure hypercholesterolemia, unspecified: Secondary | ICD-10-CM | POA: Diagnosis not present

## 2011-10-09 DIAGNOSIS — R5381 Other malaise: Secondary | ICD-10-CM | POA: Diagnosis not present

## 2011-10-09 DIAGNOSIS — E1139 Type 2 diabetes mellitus with other diabetic ophthalmic complication: Secondary | ICD-10-CM | POA: Diagnosis not present

## 2011-10-09 DIAGNOSIS — I359 Nonrheumatic aortic valve disorder, unspecified: Secondary | ICD-10-CM | POA: Diagnosis not present

## 2011-10-09 DIAGNOSIS — D509 Iron deficiency anemia, unspecified: Secondary | ICD-10-CM | POA: Diagnosis not present

## 2011-10-09 DIAGNOSIS — E1165 Type 2 diabetes mellitus with hyperglycemia: Secondary | ICD-10-CM | POA: Diagnosis not present

## 2011-10-09 DIAGNOSIS — R5383 Other fatigue: Secondary | ICD-10-CM | POA: Diagnosis not present

## 2011-10-09 DIAGNOSIS — E11359 Type 2 diabetes mellitus with proliferative diabetic retinopathy without macular edema: Secondary | ICD-10-CM | POA: Diagnosis not present

## 2011-10-09 DIAGNOSIS — I10 Essential (primary) hypertension: Secondary | ICD-10-CM | POA: Diagnosis not present

## 2011-12-24 ENCOUNTER — Other Ambulatory Visit: Payer: Self-pay | Admitting: Obstetrics and Gynecology

## 2011-12-24 DIAGNOSIS — Z1231 Encounter for screening mammogram for malignant neoplasm of breast: Secondary | ICD-10-CM

## 2012-01-06 ENCOUNTER — Ambulatory Visit
Admission: RE | Admit: 2012-01-06 | Discharge: 2012-01-06 | Disposition: A | Discharge status: Home / Self Care | Payer: Medicare Other | Source: Ambulatory Visit | Attending: Obstetrics and Gynecology | Admitting: Obstetrics and Gynecology

## 2012-01-06 DIAGNOSIS — Z1231 Encounter for screening mammogram for malignant neoplasm of breast: Secondary | ICD-10-CM

## 2012-01-08 ENCOUNTER — Other Ambulatory Visit: Payer: Self-pay | Admitting: Obstetrics and Gynecology

## 2012-01-08 DIAGNOSIS — H524 Presbyopia: Secondary | ICD-10-CM | POA: Diagnosis not present

## 2012-01-08 DIAGNOSIS — E1139 Type 2 diabetes mellitus with other diabetic ophthalmic complication: Secondary | ICD-10-CM | POA: Diagnosis not present

## 2012-01-08 DIAGNOSIS — R928 Other abnormal and inconclusive findings on diagnostic imaging of breast: Secondary | ICD-10-CM

## 2012-01-08 DIAGNOSIS — Z961 Presence of intraocular lens: Secondary | ICD-10-CM | POA: Diagnosis not present

## 2012-01-08 DIAGNOSIS — H52209 Unspecified astigmatism, unspecified eye: Secondary | ICD-10-CM | POA: Diagnosis not present

## 2012-01-24 ENCOUNTER — Ambulatory Visit
Admission: RE | Admit: 2012-01-24 | Discharge: 2012-01-24 | Disposition: A | Discharge status: Home / Self Care | Payer: Medicare Other | Source: Ambulatory Visit | Attending: Obstetrics and Gynecology | Admitting: Obstetrics and Gynecology

## 2012-01-24 ENCOUNTER — Other Ambulatory Visit: Payer: Self-pay | Admitting: Obstetrics and Gynecology

## 2012-01-24 DIAGNOSIS — R928 Other abnormal and inconclusive findings on diagnostic imaging of breast: Secondary | ICD-10-CM

## 2012-01-24 DIAGNOSIS — N63 Unspecified lump in unspecified breast: Secondary | ICD-10-CM | POA: Diagnosis not present

## 2012-01-24 DIAGNOSIS — C50919 Malignant neoplasm of unspecified site of unspecified female breast: Secondary | ICD-10-CM | POA: Diagnosis not present

## 2012-01-28 ENCOUNTER — Other Ambulatory Visit: Payer: Self-pay | Admitting: Obstetrics and Gynecology

## 2012-01-28 ENCOUNTER — Ambulatory Visit
Admission: RE | Admit: 2012-01-28 | Discharge: 2012-01-28 | Disposition: A | Discharge status: Home / Self Care | Payer: Medicare Other | Source: Ambulatory Visit | Attending: Obstetrics and Gynecology | Admitting: Obstetrics and Gynecology

## 2012-01-28 DIAGNOSIS — R928 Other abnormal and inconclusive findings on diagnostic imaging of breast: Secondary | ICD-10-CM

## 2012-01-28 DIAGNOSIS — N63 Unspecified lump in unspecified breast: Secondary | ICD-10-CM | POA: Diagnosis not present

## 2012-01-28 DIAGNOSIS — C50912 Malignant neoplasm of unspecified site of left female breast: Secondary | ICD-10-CM

## 2012-01-30 ENCOUNTER — Other Ambulatory Visit: Payer: Self-pay | Admitting: *Deleted

## 2012-01-30 ENCOUNTER — Telehealth: Payer: Self-pay | Admitting: *Deleted

## 2012-01-30 DIAGNOSIS — C50419 Malignant neoplasm of upper-outer quadrant of unspecified female breast: Secondary | ICD-10-CM

## 2012-01-30 NOTE — Telephone Encounter (Signed)
Confirmed [facility] for 02/05/12 at 1200 .  Instructions and contact information given.

## 2012-01-31 ENCOUNTER — Ambulatory Visit
Admission: RE | Admit: 2012-01-31 | Discharge: 2012-01-31 | Disposition: A | Discharge status: Home / Self Care | Payer: Medicare Other | Source: Ambulatory Visit | Attending: Obstetrics and Gynecology | Admitting: Obstetrics and Gynecology

## 2012-01-31 DIAGNOSIS — C50919 Malignant neoplasm of unspecified site of unspecified female breast: Secondary | ICD-10-CM | POA: Diagnosis not present

## 2012-01-31 DIAGNOSIS — C50912 Malignant neoplasm of unspecified site of left female breast: Secondary | ICD-10-CM

## 2012-01-31 MED ORDER — GADOBENATE DIMEGLUMINE 529 MG/ML IV SOLN
17.0000 mL | Freq: Once | INTRAVENOUS | Status: AC | PRN
  Administered 2012-01-31: 17 mL via INTRAVENOUS

## 2012-02-05 ENCOUNTER — Ambulatory Visit (HOSPITAL_BASED_OUTPATIENT_CLINIC_OR_DEPARTMENT_OTHER): Payer: Medicare Other | Admitting: Oncology

## 2012-02-05 ENCOUNTER — Ambulatory Visit: Payer: Medicare Other | Attending: General Surgery | Admitting: Physical Therapy

## 2012-02-05 ENCOUNTER — Encounter: Payer: Self-pay | Admitting: Oncology

## 2012-02-05 ENCOUNTER — Ambulatory Visit (HOSPITAL_BASED_OUTPATIENT_CLINIC_OR_DEPARTMENT_OTHER): Payer: Medicare Other | Admitting: Lab

## 2012-02-05 ENCOUNTER — Ambulatory Visit
Admission: RE | Admit: 2012-02-05 | Discharge: 2012-02-05 | Disposition: A | Discharge status: Home / Self Care | Payer: Medicare Other | Source: Ambulatory Visit | Attending: Radiation Oncology | Admitting: Radiation Oncology

## 2012-02-05 ENCOUNTER — Ambulatory Visit: Payer: Medicare Other

## 2012-02-05 ENCOUNTER — Encounter: Payer: Self-pay | Admitting: Specialist

## 2012-02-05 ENCOUNTER — Encounter (INDEPENDENT_AMBULATORY_CARE_PROVIDER_SITE_OTHER): Payer: Self-pay | Admitting: General Surgery

## 2012-02-05 ENCOUNTER — Ambulatory Visit (HOSPITAL_BASED_OUTPATIENT_CLINIC_OR_DEPARTMENT_OTHER): Payer: Medicare Other | Admitting: General Surgery

## 2012-02-05 ENCOUNTER — Telehealth: Payer: Self-pay | Admitting: Oncology

## 2012-02-05 VITALS — BP 148/79 | HR 69 | Temp 98.2°F | Resp 20 | Ht 61.1 in | Wt 191.9 lb

## 2012-02-05 DIAGNOSIS — Z170 Estrogen receptor positive status [ER+]: Secondary | ICD-10-CM | POA: Diagnosis not present

## 2012-02-05 DIAGNOSIS — C50419 Malignant neoplasm of upper-outer quadrant of unspecified female breast: Secondary | ICD-10-CM

## 2012-02-05 DIAGNOSIS — IMO0001 Reserved for inherently not codable concepts without codable children: Secondary | ICD-10-CM | POA: Diagnosis not present

## 2012-02-05 DIAGNOSIS — M25619 Stiffness of unspecified shoulder, not elsewhere classified: Secondary | ICD-10-CM | POA: Diagnosis not present

## 2012-02-05 DIAGNOSIS — R293 Abnormal posture: Secondary | ICD-10-CM | POA: Insufficient documentation

## 2012-02-05 DIAGNOSIS — C50919 Malignant neoplasm of unspecified site of unspecified female breast: Secondary | ICD-10-CM | POA: Diagnosis not present

## 2012-02-05 LAB — CBC WITH DIFFERENTIAL/PLATELET
BASO%: 0.4 % (ref 0.0–2.0)
Basophils Absolute: 0 10*3/uL (ref 0.0–0.1)
EOS%: 4 % (ref 0.0–7.0)
Eosinophils Absolute: 0.4 10*3/uL (ref 0.0–0.5)
HCT: 41.1 % (ref 34.8–46.6)
HGB: 13.2 g/dL (ref 11.6–15.9)
LYMPH%: 28.9 % (ref 14.0–49.7)
MCH: 27.2 pg (ref 25.1–34.0)
MCHC: 32.1 g/dL (ref 31.5–36.0)
MCV: 84.6 fL (ref 79.5–101.0)
MONO#: 1 10*3/uL — ABNORMAL HIGH (ref 0.1–0.9)
MONO%: 10.6 % (ref 0.0–14.0)
NEUT#: 5.2 10*3/uL (ref 1.5–6.5)
NEUT%: 56.1 % (ref 38.4–76.8)
Platelets: 212 10*3/uL (ref 145–400)
RBC: 4.86 10*6/uL (ref 3.70–5.45)
RDW: 14.5 % (ref 11.2–14.5)
WBC: 9.3 10*3/uL (ref 3.9–10.3)
lymph#: 2.7 10*3/uL (ref 0.9–3.3)

## 2012-02-05 LAB — COMPREHENSIVE METABOLIC PANEL (CC13)
ALT: 47 U/L (ref 0–55)
AST: 36 U/L — ABNORMAL HIGH (ref 5–34)
Albumin: 3.9 g/dL (ref 3.5–5.0)
Alkaline Phosphatase: 63 U/L (ref 40–150)
BUN: 8 mg/dL (ref 7.0–26.0)
CO2: 24 mEq/L (ref 22–29)
Calcium: 10 mg/dL (ref 8.4–10.4)
Chloride: 105 mEq/L (ref 98–107)
Creatinine: 0.8 mg/dL (ref 0.6–1.1)
Glucose: 103 mg/dl — ABNORMAL HIGH (ref 70–99)
Potassium: 3.9 mEq/L (ref 3.5–5.1)
Sodium: 139 mEq/L (ref 136–145)
Total Bilirubin: 0.7 mg/dL (ref 0.20–1.20)
Total Protein: 6.8 g/dL (ref 6.4–8.3)

## 2012-02-05 MED ORDER — LETROZOLE 2.5 MG PO TABS: 2.5000 mg | ORAL_TABLET | Freq: Every day | ORAL | Status: AC

## 2012-02-05 NOTE — Patient Instructions (Signed)
You have been diagnosed with an invasive cancer in your left breast, upper outer quadrant. This is large enough to require shrinking it, or downstaging with preoperative antiestrogen therapy. I think that we will get a better cosmetic result if we shrink the tumor down.  Ultimately you will need a left partial mastectomy with needle localization and sentinel lymph node biopsy.  Dr. Drue Second will prescribe the antiestrogen medication.  It is important to return to see Dr. Derrell Lolling in 3 months.

## 2012-02-05 NOTE — Progress Notes (Signed)
I met Elizabeth Hebert and her husband in today's multidisciplinary breast clinic.  I reviewed with her the distress screening, on which she initially marked her distress at a "7".  She indicated that she felt significantly relieved after meeting with the physicians and hearing about the proposed treatment plan.  I told her about the [facility] Support [facility] and, at her request, I will make a referral to [facility].

## 2012-02-05 NOTE — Progress Notes (Addendum)
Patient ID: Elizabeth Hebert, female   DOB: 1946-05-06, 66 y.o.   MRN: [id number]  No chief complaint on file.   HPI Elizabeth Hebert is a 66 y.o. female.  She was referred by Elizabeth Hebert at the breast center of [facility] for evaluation and management of an invasive cancer in the left breast, upper outer quadrant.  The patient did not perceive a problem in the left breast. She has not had a mammogram in 5 years, and Dr. Huel Cote sent her for screening mammograms.  They found a 1.3 cm mass in the left breast at the 2:00 position, 15 cm from the nipple. Ultrasound left axilla was normal. Image guided biopsy of this area showed invasive cancer, lobular phenotype, receptor positive, HER-2/neu negative.  Subsequent MRI showed a solitary mass in the left breast upper outer quadrant but it looked much larger, 3 cm. at least.    Left axilla lymph node showed nonspecific changes.  She is being seen in the [facility]  by me, Elizabeth Hebert, and Dr. Chipper Hebert.  Past history is significant for coronary disease with 2 stents placed by DR. Al [id number] . She had an MI at that time.    No subsequent coronary events. She's had open cholecystectomy and appendectomy 1970s. Colon resection for inflammatory bowel disease and hysterectomy and BSO at the same time. She has non-insulin dependent diabetes. She's had no prior history of breast problems.  Family History  reveals breast cancer in a paternal grandmother and a maternal grandmother. No family history of ovarian cancer. No family history of prostate cancer. Maternal aunt had  pancreatic cancer. HPI  Past Medical History  Diagnosis Date  . Myocardial infarction   . Colitis   . Diverticulitis   . Fibroid tumor   . Anemia   . Broken arm 2011  . Hx of hernia repair   . History of cataract surgery 2012    Past Surgical History  Procedure Date  . Cholecystectomy   . Colon resection   . Total abdominal hysterectomy 2001    Family  History  Problem Relation Age of Onset  . Cancer Maternal Grandmother     breast  . Cancer Paternal Grandmother     breast    Social History History  Substance Use Topics  . Smoking status: Former Smoker -- 1.0 packs/day    Quit date: 06/03/1997  . Smokeless tobacco: Not on file  . Alcohol Use: 0.0 oz/week    0-1 Glasses of wine per week    Allergies  Allergen Reactions  . Vasotec (Enalaprilat) Rash    Current Outpatient Prescriptions  Medication Sig Dispense Refill  . amLODipine-olmesartan (AZOR) 5-40 MG per tablet Take 1 tablet by mouth daily.      . Ascorbic Acid (VITAMIN C) 100 MG tablet Take 100 mg by mouth daily.      Marland Kitchen aspirin 81 MG tablet Take 81 mg by mouth daily.      . calcium-vitamin D (OSCAL WITH D) 250-125 MG-UNIT per tablet Take 1 tablet by mouth daily.      . cimetidine (TAGAMET) 200 MG tablet Take 200 mg by mouth 4 (four) times daily.      . colesevelam (WELCHOL) 625 MG tablet Take 1,875 mg by mouth 2 (two) times daily with a meal.      . Cranberry 500 MG CAPS Take 1 capsule by mouth daily.      . digoxin (LANOXIN) 0.125 MG tablet Take 0.125 mg by mouth  daily.      . ezetimibe (ZETIA) 10 MG tablet Take 10 mg by mouth daily.      . ferrous sulfate 325 (65 FE) MG tablet Take 325 mg by mouth daily with breakfast.      . glimepiride (AMARYL) 4 MG tablet Take 4 mg by mouth daily before breakfast.      . letrozole (FEMARA) 2.5 MG tablet Take 1 tablet (2.5 mg total) by mouth daily.  30 tablet  12  . metFORMIN (GLUCOPHAGE) 1000 MG tablet Take 1,000 mg by mouth 2 (two) times daily with a meal.      . metoprolol succinate (TOPROL-XL) 50 MG 24 hr tablet Take 50 mg by mouth daily. Take with or immediately following a meal.      . Multiple Vitamin (MULTIVITAMIN) capsule Take 1 capsule by mouth daily.      . nitroGLYCERIN (NITROSTAT) 0.4 MG SL tablet Place 0.4 mg under the tongue every 5 (five) minutes as needed.      . rosuvastatin (CRESTOR) 20 MG tablet Take 20 mg by  mouth daily.      . sertraline (ZOLOFT) 50 MG tablet Take 50 mg by mouth daily.      . sitaGLIPtin (JANUVIA) 100 MG tablet Take 100 mg by mouth daily.        Review of Systems Review of Systems  Constitutional: Negative for fever, chills and unexpected weight change.  HENT: Negative for hearing loss, congestion, sore throat, trouble swallowing and voice change.   Eyes: Negative for visual disturbance.  Respiratory: Negative for cough and wheezing.   Cardiovascular: Negative for chest pain, palpitations and leg swelling.  Gastrointestinal: Negative for nausea, vomiting, abdominal pain, diarrhea, constipation, blood in stool, abdominal distention and anal bleeding.  Genitourinary: Negative for hematuria, vaginal bleeding and difficulty urinating.  Musculoskeletal: Negative for arthralgias.  Skin: Negative for rash and wound.  Neurological: Negative for seizures, syncope and headaches.  Hematological: Negative for adenopathy. Does not bruise/bleed easily.  Psychiatric/Behavioral: Negative for confusion.    There were no vitals taken for this visit.  Physical Exam Physical Exam  Constitutional: She is oriented to person, place, and time. She appears well-developed and well-nourished. No distress.  HENT:  Head: Normocephalic and atraumatic.  Nose: Nose normal.  Mouth/Throat: No oropharyngeal exudate.  Eyes: Conjunctivae and EOM are normal. Pupils are equal, round, and reactive to light. Left eye exhibits no discharge. No scleral icterus.  Neck: Neck supple. No JVD present. No tracheal deviation present. No thyromegaly present.  Cardiovascular: Normal rate, regular rhythm, normal heart sounds and intact distal pulses.   No murmur heard. Pulmonary/Chest: Effort normal and breath sounds normal. No respiratory distress. She has no wheezes. She has no rales. She exhibits no tenderness.         Palpable mass upper outer quadrant left breast. This is at least 10 cm away from the nipple, and  is at least 3.5 cm x 2.5 cm. I cannot tell whether this is cancer, hematoma, or a combination of both. There is no axillary adenopathy.  Abdominal: Soft. Bowel sounds are normal. She exhibits no distension and no mass. There is no tenderness. There is no rebound and no guarding.    Musculoskeletal: She exhibits no edema and no tenderness.  Lymphadenopathy:    She has no cervical adenopathy.  Neurological: She is alert and oriented to person, place, and time. She exhibits normal muscle tone. Coordination normal.  Skin: Skin is warm. No rash noted. She is not  diaphoretic. No erythema. No pallor.  Psychiatric: She has a normal mood and affect. Her behavior is normal. Judgment and thought content normal.    Data Reviewed I have reviewed her imaging studies, pathology, and breast diagnostic protocol. I have coordinated her treatment plan and care was Elizabeth Hebert and Dr. Chipper Hebert.  Assessment    Invasive mammary carcinoma, upper outer quadrant left breast, probable lobular phenotype, receptor positive, HER-2-negative, clinical stage T2,  N0.  She is strongly motivated for breast conservation surgery, and I think that she would have a significant defect it we did a lumpectomy at this time. I think that she would potentially benefit from neoadjuvant antiestrogen therapy,  Although it is not completely certain that she will respond to this since she has a lobular phenotype.  Ultimately, we are hopeful that she will be a candidate for left partial mastectomy and sentinel lymph node biopsy. We talked about the surgical approach as well as total mastectomy, with or without reconstruction.  History myocardial infarction  History cardiac catheterization with 2 Stents in Place.  History of multiple abdominal operations  Non-insulin dependent diabetes mellitus  Obesity    Plan    I have discussed her care with Elizabeth Hebert, and she feels that there is a reasonable chance she would  respond to neoadjuvant antiestrogen therapy, and so we will  start with that. Dr. Welton Flakes will prescribe the medication.  I will follow the patient closely, and see her back in my office in 3 months.       [name]. Derrell Lolling, M.D., [facility] General and Minimally invasive Surgery Breast and Colorectal Surgery Office:   [phone] Pager:   [phone]  02/05/2012, 4:29 PM

## 2012-02-05 NOTE — Telephone Encounter (Signed)
gve the pt her dec 2013 appt calendar along with the bone density appt in sept at the [facility].

## 2012-02-05 NOTE — Progress Notes (Signed)
[facility] Radiation Oncology NEW PATIENT EVALUATION  Name: Elizabeth Hebert MRN: [id number]  Date:   02/05/2012           DOB: 29-Jun-1945  Status: outpatient   CC:   Ernestene Mention, MD Dr. Huel Cote   REFERRING PHYSICIAN: Ernestene Mention, MD   DIAGNOSIS: Stage II (T2 N0 M0) invasive mammary carcinoma of the left breast   HISTORY OF PRESENT ILLNESS:  Elizabeth Hebert is a 66 y.o. female who is seen today at the [facility] for evaluation of her T2 N0 invasive mammary carcinoma of the left breast. At the time of a screening mammogram on 01/06/2012 she was noted to have a possible mass in the left breast. Additional views and ultrasound on 01/24/2012 showed a suspicious mass at 2:00 position of the left breast which is felt to be palpable. On ultrasound there was an ill-defined mass at 2:00 measuring 1.3 x 1.6 x 0.9 cm. Evaluation of the axilla was without adenopathy. An ultrasound guided core biopsy on 01/24/2012 was diagnostic for invasive mammary carcinoma with lobular features. Breast prognostic profile is pending at the time this dictation. Breast MR on 01/31/2012 should biopsy change measuring 3.0 x 1.9 x 1.4 cm with no definite adenopathy. She is without complaints today. She seen today with Dr. Derrell Lolling and Dr. Welton Flakes.  PREVIOUS RADIATION THERAPY: No   PAST MEDICAL HISTORY:  has a past medical history of Myocardial infarction; Colitis; Diverticulitis; Fibroid tumor; Anemia; Broken arm (2011); hernia repair; and History of cataract surgery (2012).     PAST SURGICAL HISTORY:  Past Surgical History  Procedure Date  . Cholecystectomy   . Colon resection   . Total abdominal hysterectomy 2001     FAMILY HISTORY: family history includes Cancer in her maternal grandmother and paternal grandmother. Her father died of "brain cancer" at 16. Her mother had gross disease but died of complications from COPD at 68. Her paternal and maternal grandmother's died of breast  cancer in their 8s.   SOCIAL HISTORY:  reports that she quit smoking about 14 years ago. She does not have any smokeless tobacco history on file. She reports that she drinks alcohol. She reports that she does not use illicit drugs. Married, 2 children. 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   ALLERGIES: Vasotec   MEDICATIONS:  Current Outpatient Prescriptions  Medication Sig Dispense Refill  . amLODipine-olmesartan (AZOR) 5-40 MG per tablet Take 1 tablet by mouth daily.      . Ascorbic Acid (VITAMIN C) 100 MG tablet Take 100 mg by mouth daily.      Marland Kitchen aspirin 81 MG tablet Take 81 mg by mouth daily.      . calcium-vitamin D (OSCAL WITH D) 250-125 MG-UNIT per tablet Take 1 tablet by mouth daily.      . cimetidine (TAGAMET) 200 MG tablet Take 200 mg by mouth 4 (four) times daily.      . colesevelam (WELCHOL) 625 MG tablet Take 1,875 mg by mouth 2 (two) times daily with a meal.      . Cranberry 500 MG CAPS Take 1 capsule by mouth daily.      . digoxin (LANOXIN) 0.125 MG tablet Take 0.125 mg by mouth daily.      Marland Kitchen ezetimibe (ZETIA) 10 MG tablet Take 10 mg by mouth daily.      . ferrous sulfate 325 (65 FE) MG tablet Take 325 mg by mouth daily with breakfast.      .  glimepiride (AMARYL) 4 MG tablet Take 4 mg by mouth daily before breakfast.      . letrozole (FEMARA) 2.5 MG tablet Take 1 tablet (2.5 mg total) by mouth daily.  30 tablet  12  . metFORMIN (GLUCOPHAGE) 1000 MG tablet Take 1,000 mg by mouth 2 (two) times daily with a meal.      . metoprolol succinate (TOPROL-XL) 50 MG 24 hr tablet Take 50 mg by mouth daily. Take with or immediately following a meal.      . Multiple Vitamin (MULTIVITAMIN) capsule Take 1 capsule by mouth daily.      . nitroGLYCERIN (NITROSTAT) 0.4 MG SL tablet Place 0.4 mg under the tongue every 5 (five) minutes as needed.      . rosuvastatin (CRESTOR) 20 MG tablet Take 20 mg by mouth daily.      . sertraline (ZOLOFT) 50 MG tablet Take 50 mg by  mouth daily.      . sitaGLIPtin (JANUVIA) 100 MG tablet Take 100 mg by mouth daily.         REVIEW OF SYSTEMS:  Pertinent items are noted in HPI.    PHYSICAL EXAM: Alert and oriented 66 year old white female appearing her stated age. Wt Readings from Last 3 Encounters:  02/05/12 191 lb 14.4 oz (87.045 kg)   Temp Readings from Last 3 Encounters:  02/05/12 98.2 F (36.8 C)    BP Readings from Last 3 Encounters:  02/05/12 148/79   Pulse Readings from Last 3 Encounters:  02/05/12 69   Head and neck examination: Grossly unremarkable. Nodes: Without palpable cervical, supraclavicular, or axillary lymphadenopathy. Chest: Lungs clear. Back: Without spinal or CVA tenderness. Heart: Regular in rhythm. Breasts: There is a punctate biopsy wound along the upper-outer quadrant of the left breast with a subtle ill-defined mass in approximately 2:00. No masses are appreciated. Right breast without masses or lesions. Abdomen without hepatomegaly. Extremities without edema. Neurologic examination grossly nonfocal.    LABORATORY DATA:  Lab Results  Component Value Date   WBC 9.3 02/05/2012   HGB 13.2 02/05/2012   HCT 41.1 02/05/2012   MCV 84.6 02/05/2012   PLT 212 02/05/2012   Lab Results  Component Value Date   NA 139 02/05/2012   K 3.9 02/05/2012   CL 105 02/05/2012   CO2 24 02/05/2012   Lab Results  Component Value Date   ALT 47 02/05/2012   AST 36* 02/05/2012   ALKPHOS 63 02/05/2012   BILITOT 0.70 02/05/2012      IMPRESSION: Clinical stage II (T2 N0 M0) invasive mammary carcinoma of the left breast. Her prognostic profile is pending the time this dictation. We discussed management of her primary disease which would include mastectomy or partial mastectomy followed by radiation therapy. Dr. Derrell Lolling and Dr. Welton Flakes will decide on whether not to proceed with neoadjuvant therapy depending on her breast prognostic profile. She does appear to be a candidate for breast preservation. I discussed the potential acute  and late toxicities of radiation therapy. I can see her for a followup visit following her definitive surgery.      PLAN: As discussed above.   I spent 40 minutes minutes face to face with the patient and more than 50% of that time was spent in counseling and/or coordination of care.

## 2012-02-05 NOTE — Progress Notes (Signed)
Elizabeth Hebert [id number] [birth year] 66 y.o. 02/05/2012 2:48 PM  CC Dr. Chipper Herb Dr. Claud Kelp Dr. [name] Dr. Huel Cote  REASON FOR CONSULTATION:  66 year old female with new diagnosis of invasive left breast cancer in the upper outer quadrant. Patient was seen in the Multidisciplinary Breast Clinic for discussion of her treatment options.   STAGE:   Cancer of upper-outer quadrant of female breast   Primary site: Breast (Left)   Staging method: AJCC 7th Edition   Clinical: Stage IIA (T2, N0, cM0)   Summary: Stage IIA (T2, N0, cM0)  REFERRING PHYSICIAN: Dr. Claud Kelp  HISTORY OF PRESENT ILLNESS:  Elizabeth Hebert is a 66 y.o. female.  Without any significant medical problems. Most recently she was sent for a screening mammogram and she was found to have a 1.3 cm mass in the left breast at the 2:00 position 15 cm from the nipple. She went on to have an image guided biopsy that showed an invasive mammary carcinoma with lobular features. The tumor was estrogen receptor positive HER-2/neu negative. She had an MRI that showed a solitary mass in the left breast in the upper-outer quadrant measuring approximately 3 cm. Left axillary lymph node showed nonspecific changes. Patient's case was discussed in the multidisciplinary breast conference and recommendations were made based on NCCN and guidelines. Patient is now seen in the multidisciplinary breast clinic for discussion of her treatment options.Currently she is without any complaints.   Past Medical History: Past Medical History  Diagnosis Date  . Myocardial infarction   . Colitis   . Diverticulitis   . Fibroid tumor   . Anemia   . Broken arm 2011  . Hx of hernia repair   . History of cataract surgery 2012    Past Surgical History: Past Surgical History  Procedure Date  . Cholecystectomy   . Colon resection   . Total abdominal hysterectomy 2001    Family History: Family History    Problem Relation Age of Onset  . Cancer Maternal Grandmother     breast  . Cancer Paternal Grandmother     breast    Social History History  Substance Use Topics  . Smoking status: Former Smoker -- 1.0 packs/day    Quit date: 06/03/1997  . Smokeless tobacco: Not on file  . Alcohol Use: 0.0 oz/week    0-1 Glasses of wine per week    Allergies: Allergies  Allergen Reactions  . Vasotec (Enalaprilat) Rash    Current Medications: Current Outpatient Prescriptions  Medication Sig Dispense Refill  . amLODipine-olmesartan (AZOR) 5-40 MG per tablet Take 1 tablet by mouth daily.      . Ascorbic Acid (VITAMIN C) 100 MG tablet Take 100 mg by mouth daily.      Marland Kitchen aspirin 81 MG tablet Take 81 mg by mouth daily.      . calcium-vitamin D (OSCAL WITH D) 250-125 MG-UNIT per tablet Take 1 tablet by mouth daily.      . cimetidine (TAGAMET) 200 MG tablet Take 200 mg by mouth 4 (four) times daily.      . colesevelam (WELCHOL) 625 MG tablet Take 1,875 mg by mouth 2 (two) times daily with a meal.      . Cranberry 500 MG CAPS Take 1 capsule by mouth daily.      . digoxin (LANOXIN) 0.125 MG tablet Take 0.125 mg by mouth daily.      Marland Kitchen ezetimibe (ZETIA) 10 MG tablet Take 10 mg by mouth daily.      Marland Kitchen  ferrous sulfate 325 (65 FE) MG tablet Take 325 mg by mouth daily with breakfast.      . glimepiride (AMARYL) 4 MG tablet Take 4 mg by mouth daily before breakfast.      . metFORMIN (GLUCOPHAGE) 1000 MG tablet Take 1,000 mg by mouth 2 (two) times daily with a meal.      . metoprolol succinate (TOPROL-XL) 50 MG 24 hr tablet Take 50 mg by mouth daily. Take with or immediately following a meal.      . Multiple Vitamin (MULTIVITAMIN) capsule Take 1 capsule by mouth daily.      . nitroGLYCERIN (NITROSTAT) 0.4 MG SL tablet Place 0.4 mg under the tongue every 5 (five) minutes as needed.      . rosuvastatin (CRESTOR) 20 MG tablet Take 20 mg by mouth daily.      . sertraline (ZOLOFT) 50 MG tablet Take 50 mg by mouth  daily.      . sitaGLIPtin (JANUVIA) 100 MG tablet Take 100 mg by mouth daily.        OB/GYN History:Menarche in 11 statin drug menopause in 1990 she has not been on hormone replacement therapy she has had 2 live births first birth was at 43.  Fertility Discussion:Not applicable Prior History of Cancer:No prior history of malignancies  Health Maintenance:  Colonoscopy 2009 Bone Density Patient has had one bone density scan Last PAP smear Last Pap smear in 2000 and  ECOG PERFORMANCE STATUS: 1 - Symptomatic but completely ambulatory  Genetic Counseling/testing:Patient's history was reviewed she has a maternal grandmother with breast cancer and paternal grandmother with breast cancer there for genetic counseling is recommended.  REVIEW OF SYSTEMS: Patient does have arthritic type pain she also has history of diabetes depression she does experience hot flashes. She uses about 2 pillows at night time. She otherwise denies any fevers chills night sweats headaches no shortness of breath no chest pains palpitations no swelling in her feet. She has no nausea no vomiting no heartburn no abdominal pain no rectal bleeding no changes in her bowel or bladder habits she has no urinary incontinence dribbling or painful urination. She has not noticed any lumps or bumps she now does have some pain from the biopsy site in her breast. She has no seizure-like activities no numbness weakness or forgetfulness no bleeding problems remainder of the 14 point review of systems is negative.  PHYSICAL EXAMINATION: Blood pressure 148/79, pulse 69, temperature 98.2 F (36.8 C), resp. rate 20, height 5' 1.1" (1.552 m), weight 191 lb 14.4 oz (87.045 kg).  HEENT exam EOMI PERRLA sclerae anicteric no conjunctival pallor oral mucosa is moist neck is supple lungs are clear bilaterally to auscultation and percussion cardiovascular is regular rate rhythm no murmurs abdomen is soft nontender nondistended bowel sounds are present  no HSM extremities no edema neuro patient's alert oriented otherwise nonfocal. Left breast reveals no palpable mass which is tender to palpation. Right breast no masses or nipple discharge.  STUDIES/RESULTS: US Breast Left  02-07-2012  *RADIOLOGY REPORT*  Clinical Data:  The patient returns after screening study for evaluation of the left breast.  DIGITAL DIAGNOSTIC LEFT MAMMOGRAM  AND LEFT BREAST ULTRASOUND:  Comparison:  01/07/2012 and earlier  Findings:  Additional views are performed, confirming presence of an irregular mass in the upper outer quadrant of the left breast.  On physical exam, I palpate a focal thickening of the measuring approximately 7 cm in the upper-outer quadrant of the left breast.  Ultrasound is performed,  showing an ill-defined hypoechoic mass in the 2 o'clock location of the left breast 15 cm from nipple. Masses taller than wide and associated acoustic shadowing and measures 1.3 x 1.6 x 0.9 cm.  Evaluation of the left axilla shows no enlarged lymph nodes.  IMPRESSION: Suspicious mass in the 2 o'clock location of the left breast, palpable on physical exam.  RECOMMENDATION: Ultrasound guided core biopsy.  The patient requests biopsy today. This is performed and dictated separately.  BI-RADS CATEGORY 4:  Suspicious abnormality - biopsy should be considered.   Original Report Authenticated By: Patterson Hammersmith, M.D.    Mr Breast Bilateral W Wo Contrast  02/04/2012  *RADIOLOGY REPORT*  Clinical Data: Recently diagnosed left upper outer quadrant invasive mammary carcinoma with lobular features manifesting as a screen detected breast mass.  BUN and creatinine were obtained on site at [facility] at [address] Results:  BUN 7 mg/dL,  Creatinine 0.9 mg/dL.  BILATERAL BREAST MRI WITH AND WITHOUT CONTRAST  Technique: Multiplanar, multisequence MR images of both breasts were obtained prior to and following the intravenous administration of 17ml of Multihance.  Three  dimensional images were evaluated at the independent DynaCad workstation.  Comparison:  Prior exams  Findings: There is a minimal background parenchymal type enhancement pattern.  In the left upper outer quadrant, there is an area of irregular mass-like enhancement with post biopsy change and central clip artifact measuring 3.0 x 1.9 x 1.4 cm, with associated plateau type enhancement kinetics.  This corresponds to the biopsy- proven malignancy.  Several normal-sized left axillary lymph nodes demonstrate cortical thickness at upper limits of normal, with approximately 50/50 cortex:hilar ratio.  The largest of these measures 6 mm in short axis diameter.  No other area of abnormal enhancement is seen in either breast.  Apart from the biopsy change, no abnormal T2-weighted hyperintensity is identified. There is no evidence for adjacent pectoralis muscle or chest wall enhancement.  IMPRESSION: Solitary 3 cm area of irregular mass-like enhancement left breast upper outer quadrant, corresponding to the biopsy-proven breast cancer.  Nonspecific left axillary lymph nodes with cortical thickness at upper limits of normal.  These could be reactive but metastasis could have a similar appearance.  Options include further evaluation with ultrasound and possible ultrasound-guided biopsy or sentinel node biopsy.  No MRI evidence for left multicentric malignancy or contralateral right-sided malignancy.  RECOMMENDATION: Treatment plan  BI-RADS CATEGORY 6:  Known biopsy-proven malignancy - appropriate action should be taken.  THREE-DIMENSIONAL MR IMAGE RENDERING ON INDEPENDENT WORKSTATION:  Three-dimensional MR images were rendered by post-processing of the original MR data on an independent workstation.  The three- dimensional MR images were interpreted, and findings were reported in the accompanying complete MRI report for this study.   Original Report Authenticated By: Harrel Lemon, M.D.    [place] Core Biopsy  01/24/2012   *RADIOLOGY REPORT*  Clinical Data:  Mass in the 2 o'clock location of the left breast.  ULTRASOUND GUIDED VACUUM ASSISTED CORE BIOPSY OF THE LEFT BREAST  Comparison: 01/24/2012 and earlier  I met with the patient and we discussed the procedure of ultrasound- guided biopsy, including benefits and alternatives.  We discussed the high likelihood of a successful procedure. We discussed the risks of the procedure including infection, bleeding, tissue injury, clip migration, and inadequate sampling.  Informed written consent was given.  Using sterile technique, 2% lidocaine ultrasound guidance and a 12 gauge vacuum assisted needle biopsy was performed of mass in the 2 o'clock location  of the left breast using a lateral approach.  At the conclusion of the procedure, a ribbon shaped tissue marker clip was deployed into the biopsy cavity.  Follow-up 2-view mammogram was performed and dictated separately.  IMPRESSION: Ultrasound-guided biopsy of left breast mass.  No apparent complications.   Original Report Authenticated By: Patterson Hammersmith, M.D.    Mm Digital Diag Ltd L  01/24/2012  *RADIOLOGY REPORT*  Clinical Data:  The patient returns after screening study for evaluation of the left breast.  DIGITAL DIAGNOSTIC LEFT MAMMOGRAM  AND LEFT BREAST ULTRASOUND:  Comparison:  01/07/2012 and earlier  Findings:  Additional views are performed, confirming presence of an irregular mass in the upper outer quadrant of the left breast.  On physical exam, I palpate a focal thickening of the measuring approximately 7 cm in the upper-outer quadrant of the left breast.  Ultrasound is performed, showing an ill-defined hypoechoic mass in the 2 o'clock location of the left breast 15 cm from nipple. Masses taller than wide and associated acoustic shadowing and measures 1.3 x 1.6 x 0.9 cm.  Evaluation of the left axilla shows no enlarged lymph nodes.  IMPRESSION: Suspicious mass in the 2 o'clock location of the left breast, palpable on  physical exam.  RECOMMENDATION: Ultrasound guided core biopsy.  The patient requests biopsy today. This is performed and dictated separately.  BI-RADS CATEGORY 4:  Suspicious abnormality - biopsy should be considered.   Original Report Authenticated By: Patterson Hammersmith, M.D.    Mm Digital Diagnostic Unilat L  01/24/2012  *RADIOLOGY REPORT*  Clinical Data:  Status post biopsy of mass in the 2 o'clock location of the left breast.  DIGITAL DIAGNOSTIC LEFT MAMMOGRAM  Comparison:  01/24/2012 and earlier  Findings:  Films are performed following ultrasound guided biopsy of mass the 2 o'clock location of the left breast.  A ribbon shaped clip is identified in the upper-outer quadrant of the left breast.  IMPRESSION: Tissue marker clip is in expected location after biopsy.   Original Report Authenticated By: Patterson Hammersmith, M.D.    Mm Radiologist Eval And Mgmt  01/28/2012  *RADIOLOGY REPORT*  ESTABLISHED PATIENT OFFICE VISIT - LEVEL II [phone])  Chief Complaint:  The patient returns for discussion of the pathology report.  She underwent ultrasound-guided core needle biopsy of a mass in the left upper outer quadrant posteriorly on 01/24/2012.  History:  Screening mammogram demonstrated a mass in the outer portion of the left breast.  Additional imaging confirmed the mass to be suspicious and biopsy was recommended.  Exam:  The biopsy site in the outer portion of the left breast is clean and dry with no sign of hematoma or infection.  Pathology: Histologic evaluation demonstrates invasive mammary carcinoma with lobular features, grade I.  This is concordant with the imaging findings.  Assessment and Plan:  Results were discussed with the patient and her husband.  The patient is scheduled to be evaluated in the [facility] on 02/05/2012. Breast MRI is scheduled on 01/31/2012.  Questions were answered. Educational materials were given.   Original Report Authenticated By: Daryl Eastern, M.D.      LABS:    Chemistry      Component Value Date/Time   NA 139 02/05/2012 1218   K 3.9 02/05/2012 1218   CL 105 02/05/2012 1218   CO2 24 02/05/2012 1218   BUN 8.0 02/05/2012 1218   CREATININE 0.8 02/05/2012 1218      Component Value Date/Time  CALCIUM 10.0 02/05/2012 1218   ALKPHOS 63 02/05/2012 1218   AST 36* 02/05/2012 1218   ALT 47 02/05/2012 1218   BILITOT 0.70 02/05/2012 1218      Lab Results  Component Value Date   WBC 9.3 02/05/2012   HGB 13.2 02/05/2012   HCT 41.1 02/05/2012   MCV 84.6 02/05/2012   PLT 212 02/05/2012       PATHOLOGY: PROGNOSTIC INDICATORS - ACIS Results IMMUNOHISTOCHEMICAL AND MORPHOMETRIC ANALYSIS BY THE AUTOMATED CELLULAR IMAGING SYSTEM (ACIS) This invasive carcinoma shows the following breast prognostic profile. Estrogen Receptor (Negative, <1%): 100%,POSITIVE, STRONG STAINING INTENSITY Progesterone Receptor (Negative, <1%): 2%, POSITIVE,STRONG STAINING INTENSITY Proliferation Marker Ki67 by M IB-1 (Low<20%): 42% All controls stained appropriately Abigail Miyamoto MD Pathologist, Electronic Signature ( Signed 02/07/2012) CHROMOGENIC IN-SITU HYBRIDIZATION Interpretation HER-2/NEU BY CISH - NO AMPLIFICATION OF HER-2 DETECTED. THE RATIO OF HER-2: CEP 17 SIGNALS WAS 1.43. Reference range: Ratio: HER2:CEP17 < 1.8 - gene amplification not observed Ratio: HER2:CEP 17 1.8-2.2 - equivocal result Ratio: HER2:CEP17 > 2.2 - gene amplification observed Pecola Leisure MD Pathologist, Electronic Signature ( Signed 02/05/2012) 1 of 2 FINAL for Elizabeth, Hebert [phone]) FINAL DIAGNOSIS Diagnosis Breast, left, needle core biopsy, mass, 1 o'clock - POSITIVE FOR INVASIVE MAMMARY CARCINOMA WITH LOBULAR FEATURES. - SEE COMMENT. Microscopic Comment An immunohistochemical stain for cytokeratin AE1/AE3 is performed which is positive, confirming the above diagnosis. Although definitive grading of breast carcinoma is best done on excision, the features of  the tumor from the left 1 o'clock needle core biopsy are compatible with a grade I breast carcinoma. Breast prognostic markers will be performed and reported in an addendum. The findings are called to [facility] on 01/28/2012. Dr. Luisa Hart has seen this case in consultation with agreement. (RAH:eps 01/27/12) Zandra Abts MD Pathologist, Electronic Signature (Case signed 01/28/2012) Specimen [facility] and Clinic  ASSESSMENT    66 year old female with new diagnosis of left breast cancer which is invasive lobular by ultrasound measured 1.6 cm by MRI measuring 3 cm. Patient does far has undergone a needle core biopsy. Patient is interested in breast conservation. We have therefore recommended neoadjuvant antiestrogen therapy. Risks and benefits of letrozole were discussed with the patient. She understands that he may take approximately 6-9 months to get a maximal benefit. We will plan on following the patient periodically and doing imaging studies to make sure that we are getting a response that is required for her to eventually have a breast conserving procedure. At about 9 months time we will do another MRI to see if there has been a response. In the meantime I will continue to follow the patient periodically to make sure she is not experiencing any side effects from the antiestrogen therapy. If patient successfully does undergo breast conservation with a lumpectomy and sentinel lymph node biopsy. She will thereafter receive postlumpectomy radiation therapy and this was discussed by Dr. Dayton Scrape.    PLAN:    #1 patient will proceed with antiestrogen therapy this will consist of letrozole 2.5 mg daily. A prescription was sent to her pharmacy today. I did discuss risks and benefits with her. And she has agreed with the plan and she will begin taking the medication as soon she gets the prescription filled.  #2 patient has not had a bone density scan in quite some time and I have therefore  gone ahead and ordered this. Patient is at risk for developing osteopenia/osteoporosis and therefore a baseline bone density scan it is important.  She is also recommended to start taking calcium plus vitamin D and vitamin D3 period  #3 I will plan on seeing her back in about 3 months time or sooner if need arises.     Thank you so much for allowing me to participate in the care of [name]. I will continue to follow up the patient with you and assist in her care.  All questions were answered. The patient knows to call the clinic with any problems, questions or concerns. We can certainly see the patient much sooner if necessary.  I spent 60 minutes counseling the patient face to face. The total time spent in the appointment was 60 minutes.  Drue Second, MD Medical/Oncology [facility] [phone] (beeper) [phone] (Office)  02/05/2012, 2:49 PM

## 2012-02-05 NOTE — Patient Instructions (Addendum)
We discussed your diagnosis and the neoadjuvant anti-estrogen therapy with letrozole to shrink the tumor down so Dr. Derrell Lolling can perform a lumpectomy  Information on letrozole is as below  Prescription was sent to your pharmacy.   Begin taking the medicines as soon as possible at the same time every day  I have ordered a bone density scan on you to evaluate your bone health  I will see you back in 3 months or sooner if need arises. Please call with any questions or problems  Letrozole tablets What is this medicine? LETROZOLE (LET roe zole) blocks the production of estrogen. Certain types of breast cancer grow under the influence of estrogen. Letrozole helps block tumor growth. This medicine is used to treat advanced breast cancer in postmenopausal women. This medicine may be used for other purposes; ask your health care provider or pharmacist if you have questions. What should I tell my health care provider before I take this medicine? They need to know if you have any of these conditions: -liver disease -osteoporosis (weak bones) -an unusual or allergic reaction to letrozole, other medicines, foods, dyes, or preservatives -pregnant or trying to get pregnant -breast-feeding How should I use this medicine? Take this medicine by mouth with a glass of water. You may take it with or without food. Follow the directions on the prescription label. Take your medicine at regular intervals. Do not take your medicine more often than directed. Do not stop taking except on your doctor's advice. Talk to your pediatrician regarding the use of this medicine in children. Special care may be needed. Overdosage: If you think you have taken too much of this medicine contact a poison control center or emergency room at once. NOTE: This medicine is only for you. Do not share this medicine with others. What if I miss a dose? If you miss a dose, take it as soon as you can. If it is almost time for your next  dose, take only that dose. Do not take double or extra doses. What may interact with this medicine? Do not take this medicine with any of the following medications: -estrogens, like hormone replacement therapy or birth control pills This medicine may also interact with the following medications: -dietary supplements such as androstenedione or DHEA -prasterone -tamoxifen This list may not describe all possible interactions. Give your health care provider a list of all the medicines, herbs, non-prescription drugs, or dietary supplements you use. Also tell them if you smoke, drink alcohol, or use illegal drugs. Some items may interact with your medicine. What should I watch for while using this medicine? Visit your doctor or health care professional for regular check-ups to monitor your condition. Do not use this drug if you are pregnant. Serious side effects to an unborn child are possible. Talk to your doctor or pharmacist for more information. You may get drowsy or dizzy. Do not drive, use machinery, or do anything that needs mental alertness until you know how this medicine affects you. Do not stand or sit up quickly, especially if you are an older patient. This reduces the risk of dizzy or fainting spells. What side effects may I notice from receiving this medicine? Side effects that you should report to your doctor or health care professional as soon as possible: -allergic reactions like skin rash, itching, or hives -bone fracture -chest pain -difficulty breathing or shortness of breath -severe pain, swelling, warmth in the leg -unusually weak or tired -vaginal bleeding Side effects that usually do  not require medical attention (report to your doctor or health care professional if they continue or are bothersome): -bone, back, joint, or muscle pain -dizziness -fatigue -fluid retention -headache -hot flashes, night sweats -nausea -weight gain This list may not describe all possible  side effects. Call your doctor for medical advice about side effects. You may report side effects to FDA at 1-800-FDA-1088. Where should I keep my medicine? Keep out of the reach of children. Store between 15 and 30 degrees C (59 and 86 degrees F). Throw away any unused medicine after the expiration date. NOTE: This sheet is a summary. It may not cover all possible information. If you have questions about this medicine, talk to your doctor, pharmacist, or health care provider.  2012, [employer]vier/[employer] Standard. (07/31/2007 4:43:44 PM)

## 2012-02-06 ENCOUNTER — Encounter: Payer: Self-pay | Admitting: *Deleted

## 2012-02-06 LAB — CANCER ANTIGEN 27.29: CA 27.29: 22 U/mL (ref 0–39)

## 2012-02-06 NOTE — Progress Notes (Signed)
Mailed after appt letter to pt. 

## 2012-02-10 ENCOUNTER — Telehealth: Payer: Self-pay | Admitting: *Deleted

## 2012-02-10 NOTE — Telephone Encounter (Signed)
Spoke to pt concerning [facility] from 02/05/12.  Pt denies questions or concerns regarding dx or treatment care plan.  Encourage pt to call with needs.  Received verbal understanding.  Contact information given.

## 2012-02-14 ENCOUNTER — Ambulatory Visit
Admission: RE | Admit: 2012-02-14 | Discharge: 2012-02-14 | Disposition: A | Discharge status: Home / Self Care | Payer: Medicare Other | Source: Ambulatory Visit | Attending: Oncology | Admitting: Oncology

## 2012-02-14 DIAGNOSIS — C50419 Malignant neoplasm of upper-outer quadrant of unspecified female breast: Secondary | ICD-10-CM

## 2012-02-14 DIAGNOSIS — M949 Disorder of cartilage, unspecified: Secondary | ICD-10-CM | POA: Diagnosis not present

## 2012-02-14 DIAGNOSIS — M899 Disorder of bone, unspecified: Secondary | ICD-10-CM | POA: Diagnosis not present

## 2012-02-24 ENCOUNTER — Other Ambulatory Visit: Payer: Medicare Other | Admitting: Lab

## 2012-02-24 ENCOUNTER — Ambulatory Visit (HOSPITAL_BASED_OUTPATIENT_CLINIC_OR_DEPARTMENT_OTHER): Payer: Medicare Other | Admitting: Genetic Counselor

## 2012-02-24 ENCOUNTER — Encounter: Payer: Self-pay | Admitting: Genetic Counselor

## 2012-02-24 DIAGNOSIS — Z800 Family history of malignant neoplasm of digestive organs: Secondary | ICD-10-CM

## 2012-02-24 DIAGNOSIS — C50419 Malignant neoplasm of upper-outer quadrant of unspecified female breast: Secondary | ICD-10-CM

## 2012-02-24 DIAGNOSIS — Z803 Family history of malignant neoplasm of breast: Secondary | ICD-10-CM

## 2012-02-24 DIAGNOSIS — Z8049 Family history of malignant neoplasm of other genital organs: Secondary | ICD-10-CM

## 2012-02-24 NOTE — Progress Notes (Signed)
Dr.  Drue Second requested a consultation for genetic counseling and risk assessment for Elizabeth Hebert, a 66 y.o. female, for discussion of her personal history of breast cancer and family history of pancreatic and breast cancer. She presents to clinic today to discuss the possibility of a genetic predisposition to cancer, and to further clarify her risks, as well as her family members' risks for cancer.   HISTORY OF PRESENT ILLNESS: In August 2013, at the age of 80, Elizabeth Hebert was diagnosed with invasive ductal carcinoma of the breast. This was treated with pills, surgery and radiation.    Past Medical History  Diagnosis Date  . Myocardial infarction   . Colitis   . Diverticulitis   . Fibroid tumor   . Anemia   . Broken arm 2011  . Hx of hernia repair   . History of cataract surgery 2012    Past Surgical History  Procedure Date  . Cholecystectomy   . Colon resection   . Total abdominal hysterectomy 2001    History  Substance Use Topics  . Smoking status: Former Smoker -- 1.0 packs/day for 30 years    Quit date: 06/03/1997  . Smokeless tobacco: Not on file  . Alcohol Use: 0.0 oz/week    0-1 Glasses of wine per week    REPRODUCTIVE HISTORY AND PERSONAL RISK ASSESSMENT FACTORS: Menarche was at age 6.   Menopause at age 69 Uterus Intact: No Ovaries Intact: No G2P2A0 , first live birth at age 82  She has not previously undergone treatment for infertility.   OCP never used   She has not used HRT in the past.    FAMILY HISTORY:  We obtained a detailed, 4-generation family history.  Significant diagnoses are listed below: Family History  Problem Relation Age of Onset  . Breast cancer Maternal Grandmother 33  . Cervical cancer Paternal Grandmother 68  . Pancreatic cancer Maternal Aunt     diagnosed in her 22s  . Cancer Cousin     maternal cousin died of a cancer that started in his eye  The patient was diagnosed with breast cancer at age 51. Her  mother died at age 32 and did not have cancer.  She has three maternal uncles and three maternal aunts. One aunt had pancreatic cancer in her 60s.  Another aunt, who is currently 26, had a son who died in his 48s of cancer that started in his eye.  The patient's maternal grandmother was diagnosed with breast cancer at age 39.  The patient does not know much information about her fathers side of the family.  She is aware that her paternal grandmother was diagnosed with cervical cancer at age 63.  Patient's maternal ancestors are of [place], [place] and [place] descent, and paternal ancestors are of [place], [place] and [place] descent. There is no reported Ashkenazi Jewish ancestry. There is no known consanguinity.  GENETIC COUNSELING RISK ASSESSMENT, DISCUSSION, AND SUGGESTED FOLLOW UP: We reviewed the natural history and genetic etiology of sporadic, familial and hereditary cancer syndromes.  About 5-10% of breast cancer is hereditary.  Of this, about 85% is the result of a BRCA1 or BRCA2 mutation.  We reviewed the red flags of hereditary cancer syndromes and the dominant inheritance patterns.  At this time she does not meet medical criteria by her insurance carrier for BRCA testing.  The patient will contact her living maternal aunt to see if there are other cancers in the family and will call back  if she has updated information.  The patient's personal and family history is suggestive of the following possible diagnosis: sporadic cancer  We discussed that identification of a hereditary cancer syndrome may help her care providers tailor the patients medical management. If a mutation indicating hereditary cancer syndrome is detected in this case, the [employer] recommendations would include increased cancer surveillance and possible prophylactic surgery. If a mutation is detected, the patient will be referred back to the referring provider and to any additional appropriate care  providers to discuss the relevant options.   If a mutation is not found in the patient, this will decrease the likelihood of a hereditary cancer syndroem as the explanation for her breast cancer. Cancer surveillance options would be discussed for the patient according to the appropriate standard National Comprehensive Cancer Network and American Cancer Society guidelines, with consideration of their personal and family history risk factors. In this case, the patient will be referred back to their care providers for discussions of management.   In order to estimate her chance of having a BRCA1 or BRCA2 mutation, we used statistical models (Penn II) and laboratory data that take into account her personal medical history, family history and ancestry.  Because each model is different, there can be a lot of variability in the risks they give.  Therefore, these numbers must be considered a rough range and not a precise risk of having a BRCA1 or BRCA2 mutation.  These models estimate that she has approximately a 10% chance of having a mutation.   After considering the risks, benefits, and limitations, the patient declined genetic testing.   The patient was seen for a total of 60 minutes, greater than 50% of which was spent face-to-face counseling.  This plan is being carried out per Dr. Milta Deiters recommendations.  This note will also be sent to the referring provider via the electronic medical record. The patient will be supplied with a summary of this genetic counseling discussion as well as educational information on the discussed hereditary cancer syndromes following the conclusion of their visit.   Patient was discussed with Dr. Drue Second.  _______________________________________________________________________ For Office Staff:  Number of people involved in session: 4 Was an Intern/ student involved with case: yes

## 2012-02-26 DIAGNOSIS — C50919 Malignant neoplasm of unspecified site of unspecified female breast: Secondary | ICD-10-CM | POA: Diagnosis not present

## 2012-02-26 DIAGNOSIS — Z0000 Encounter for general adult medical examination without abnormal findings: Secondary | ICD-10-CM | POA: Diagnosis not present

## 2012-02-26 DIAGNOSIS — F325 Major depressive disorder, single episode, in full remission: Secondary | ICD-10-CM | POA: Diagnosis not present

## 2012-02-26 DIAGNOSIS — I10 Essential (primary) hypertension: Secondary | ICD-10-CM | POA: Diagnosis not present

## 2012-02-26 DIAGNOSIS — E1139 Type 2 diabetes mellitus with other diabetic ophthalmic complication: Secondary | ICD-10-CM | POA: Diagnosis not present

## 2012-02-26 DIAGNOSIS — Z23 Encounter for immunization: Secondary | ICD-10-CM | POA: Diagnosis not present

## 2012-02-26 DIAGNOSIS — E11359 Type 2 diabetes mellitus with proliferative diabetic retinopathy without macular edema: Secondary | ICD-10-CM | POA: Diagnosis not present

## 2012-02-26 DIAGNOSIS — I359 Nonrheumatic aortic valve disorder, unspecified: Secondary | ICD-10-CM | POA: Diagnosis not present

## 2012-03-04 ENCOUNTER — Encounter: Payer: Self-pay | Admitting: Genetic Counselor

## 2012-05-08 ENCOUNTER — Other Ambulatory Visit (HOSPITAL_BASED_OUTPATIENT_CLINIC_OR_DEPARTMENT_OTHER): Payer: Medicare Other | Admitting: Lab

## 2012-05-08 ENCOUNTER — Telehealth: Payer: Self-pay | Admitting: Oncology

## 2012-05-08 ENCOUNTER — Encounter: Payer: Self-pay | Admitting: Oncology

## 2012-05-08 ENCOUNTER — Ambulatory Visit (HOSPITAL_BASED_OUTPATIENT_CLINIC_OR_DEPARTMENT_OTHER): Payer: Medicare Other | Admitting: Oncology

## 2012-05-08 VITALS — BP 153/88 | HR 81 | Temp 98.4°F | Resp 20 | Ht 61.1 in | Wt 193.1 lb

## 2012-05-08 DIAGNOSIS — C50419 Malignant neoplasm of upper-outer quadrant of unspecified female breast: Secondary | ICD-10-CM

## 2012-05-08 DIAGNOSIS — R5381 Other malaise: Secondary | ICD-10-CM | POA: Diagnosis not present

## 2012-05-08 DIAGNOSIS — R5383 Other fatigue: Secondary | ICD-10-CM | POA: Diagnosis not present

## 2012-05-08 LAB — COMPREHENSIVE METABOLIC PANEL (CC13)
ALT: 43 U/L (ref 0–55)
AST: 33 U/L (ref 5–34)
Alkaline Phosphatase: 87 U/L (ref 40–150)
Glucose: 266 mg/dl — ABNORMAL HIGH (ref 70–99)
Potassium: 4 mEq/L (ref 3.5–5.1)
Sodium: 140 mEq/L (ref 136–145)
Total Bilirubin: 0.55 mg/dL (ref 0.20–1.20)
Total Protein: 7 g/dL (ref 6.4–8.3)

## 2012-05-08 LAB — CBC WITH DIFFERENTIAL/PLATELET
BASO%: 1 % (ref 0.0–2.0)
EOS%: 4.5 % (ref 0.0–7.0)
LYMPH%: 27.9 % (ref 14.0–49.7)
MCH: 27.6 pg (ref 25.1–34.0)
MCHC: 33.1 g/dL (ref 31.5–36.0)
MCV: 83.3 fL (ref 79.5–101.0)
MONO%: 7.9 % (ref 0.0–14.0)
Platelets: 219 10*3/uL (ref 145–400)
RBC: 4.9 10*6/uL (ref 3.70–5.45)
RDW: 15.7 % — ABNORMAL HIGH (ref 11.2–14.5)

## 2012-05-08 NOTE — Telephone Encounter (Signed)
gve the pt her feb 2014 appt calendar. The pt is aware that she will be contacted with the appt for the breast mri at [facility] imaging. S/w kathy mcconell and she is aware.

## 2012-05-08 NOTE — Patient Instructions (Addendum)
Tolerating the letrozole well  We will get an MRI of your breasts prior to your next visit to evaluate response to your medications  I will see you back in February.

## 2012-05-08 NOTE — Progress Notes (Signed)
OFFICE PROGRESS NOTE  CC Dr. [name] Dr. Claud Kelp Dr. Chipper Herb Dr. Huel Cote  DIAGNOSIS: 66 year old female with invasive lobular carcinoma of the left breast in the upper-outer quadrant stage IIA.  PRIOR THERAPY:  #1 patient originally presented to the multidisciplinary breast clinic in September 2013 with new diagnosis of left invasive lobular carcinoma by ultrasound measuring 1.6 and by MRI measuring 3 cm. She had undergone a needle core biopsy the tumor was lobular ER positive.  #2 she was seen by Dr. Dayton Scrape as well as Dr. Claud Kelp patient was interested in breast conservation. We recommended neoadjuvant antiestrogen therapy to try to reduce the size of the tumor for a good cosmetic result at this time of lumpectomy. She went on to receive letrozole 2.5 mg daily starting in 02/05/2012. Thus far she is tolerating it well.  CURRENT THERAPY: Neoadjuvant letrozole 2.5 mg daily since 02/05/2012  INTERVAL HISTORY: Elizabeth Hebert 66 y.o. female returns for followup visit at 3 months. She has been consistently taking her letrozole. She really has no problems except for some fatigue. She denies any ostial arthritis. She has no nausea vomiting fevers chills night sweats. She is taking her vitamin D as well as vitamin D3. She occasionally does experience hot flashes. Remainder of the 10 point review of systems is negative.  MEDICAL HISTORY: Past Medical History  Diagnosis Date  . Myocardial infarction   . Colitis   . Diverticulitis   . Fibroid tumor   . Anemia   . Broken arm 2011  . Hx of hernia repair   . History of cataract surgery 2012    ALLERGIES:  is allergic to vasotec.  MEDICATIONS:  Current Outpatient Prescriptions  Medication Sig Dispense Refill  . amLODipine-olmesartan (AZOR) 5-40 MG per tablet Take 1 tablet by mouth daily.      . Ascorbic Acid (VITAMIN C) 100 MG tablet Take 100 mg by mouth daily.      Marland Kitchen aspirin 81 MG tablet Take 81  mg by mouth daily.      . cimetidine (TAGAMET) 200 MG tablet Take 200 mg by mouth 4 (four) times daily.      . colesevelam (WELCHOL) 625 MG tablet Take 1,875 mg by mouth 2 (two) times daily with a meal.      . Cranberry 500 MG CAPS Take 1 capsule by mouth daily.      . digoxin (LANOXIN) 0.125 MG tablet Take 0.125 mg by mouth daily.      Marland Kitchen ezetimibe (ZETIA) 10 MG tablet Take 10 mg by mouth daily.      . ferrous sulfate 325 (65 FE) MG tablet Take 325 mg by mouth daily with breakfast.      . glimepiride (AMARYL) 4 MG tablet Take 4 mg by mouth daily before breakfast.      . metFORMIN (GLUCOPHAGE) 1000 MG tablet Take 1,000 mg by mouth 2 (two) times daily with a meal.      . metoprolol succinate (TOPROL-XL) 50 MG 24 hr tablet Take 50 mg by mouth daily. Take with or immediately following a meal.      . Multiple Vitamin (MULTIVITAMIN) capsule Take 1 capsule by mouth daily.      . nitroGLYCERIN (NITROSTAT) 0.4 MG SL tablet Place 0.4 mg under the tongue every 5 (five) minutes as needed.      . rosuvastatin (CRESTOR) 20 MG tablet Take 20 mg by mouth daily.      . sertraline (ZOLOFT) 50 MG tablet Take  50 mg by mouth daily.      . sitaGLIPtin (JANUVIA) 100 MG tablet Take 100 mg by mouth daily.      . calcium-vitamin D (OSCAL WITH D) 250-125 MG-UNIT per tablet Take 1 tablet by mouth daily.        SURGICAL HISTORY:  Past Surgical History  Procedure Date  . Cholecystectomy   . Colon resection   . Total abdominal hysterectomy 2001    REVIEW OF SYSTEMS:  Pertinent items are noted in HPI.   HEALTH MAINTENANCE:  PHYSICAL EXAMINATION: Blood pressure 153/88, pulse 81, temperature 98.4 F (36.9 C), temperature source Oral, resp. rate 20, height 5' 1.1" (1.552 m), weight 193 lb 1.6 oz (87.59 kg). Body mass index is 36.37 kg/(m^2). ECOG PERFORMANCE STATUS: 0 - Asymptomatic   General appearance: alert, cooperative and appears stated age Neck: no adenopathy, no carotid bruit, no JVD, supple, symmetrical,  trachea midline and thyroid not enlarged, symmetric, no tenderness/mass/nodules Lymph nodes: Cervical, supraclavicular, and axillary nodes normal. Resp: clear to auscultation bilaterally Back: symmetric, no curvature. ROM normal. No CVA tenderness. Cardio: regular rate and rhythm GI: soft, non-tender; bowel sounds normal; no masses,  no organomegaly Extremities: extremities normal, atraumatic, no cyanosis or edema Neurologic: Grossly normal Left breast examination no skin nodularity I am barely able to palpate the mass there is no nipple discharge or retraction or inversion. Right breast no masses or nipple discharge.  LABORATORY DATA: Lab Results  Component Value Date   WBC 8.2 05/08/2012   HGB 13.5 05/08/2012   HCT 40.8 05/08/2012   MCV 83.3 05/08/2012   PLT 219 05/08/2012      Chemistry      Component Value Date/Time   NA 140 05/08/2012 0954   K 4.0 05/08/2012 0954   CL 105 05/08/2012 0954   CO2 25 05/08/2012 0954   BUN 8.0 05/08/2012 0954   CREATININE 0.8 05/08/2012 0954      Component Value Date/Time   CALCIUM 9.5 05/08/2012 0954   ALKPHOS 87 05/08/2012 0954   AST 33 05/08/2012 0954   ALT 43 05/08/2012 0954   BILITOT 0.55 05/08/2012 0954       RADIOGRAPHIC STUDIES:  No results found.  ASSESSMENT: 66 year old female with  #1 stage IIa invasive lobular carcinoma measuring 3.0 cm of the left breast status post needle core biopsy performed in August 2013. Patient is now on neoadjuvant antiestrogen therapy with letrozole 2.5 mg daily. She seems to be tolerating it very well.  PLAN:   #1 patient will continue letrozole 2.5 mg daily.  #2 she will return in 3 months time for followup but prior to that we will get MRI of the breasts performed to evaluate response to therapy.   All questions were answered. The patient knows to call the clinic with any problems, questions or concerns. We can certainly see the patient much sooner if necessary.  I spent 25 minutes counseling the  patient face to face. The total time spent in the appointment was 30 minutes.    Drue Second, MD Medical/Oncology [facility] [phone] (beeper) [phone] (Office)  05/08/2012, 11:39 AM

## 2012-05-29 DIAGNOSIS — E1139 Type 2 diabetes mellitus with other diabetic ophthalmic complication: Secondary | ICD-10-CM | POA: Diagnosis not present

## 2012-05-29 DIAGNOSIS — E1165 Type 2 diabetes mellitus with hyperglycemia: Secondary | ICD-10-CM | POA: Diagnosis not present

## 2012-06-22 DIAGNOSIS — J209 Acute bronchitis, unspecified: Secondary | ICD-10-CM | POA: Diagnosis not present

## 2012-07-13 ENCOUNTER — Ambulatory Visit
Admission: RE | Admit: 2012-07-13 | Discharge: 2012-07-13 | Disposition: A | Discharge status: Home / Self Care | Payer: Medicare Other | Source: Ambulatory Visit | Attending: Oncology | Admitting: Oncology

## 2012-07-13 DIAGNOSIS — C50919 Malignant neoplasm of unspecified site of unspecified female breast: Secondary | ICD-10-CM | POA: Diagnosis not present

## 2012-07-13 DIAGNOSIS — C50419 Malignant neoplasm of upper-outer quadrant of unspecified female breast: Secondary | ICD-10-CM

## 2012-07-13 MED ORDER — GADOBENATE DIMEGLUMINE 529 MG/ML IV SOLN
17.0000 mL | Freq: Once | INTRAVENOUS | Status: AC | PRN
  Administered 2012-07-13: 17 mL via INTRAVENOUS

## 2012-07-24 ENCOUNTER — Telehealth: Payer: Self-pay | Admitting: Medical Oncology

## 2012-07-24 NOTE — Telephone Encounter (Signed)
Breast mass decreased from 3.0 x 1.9 x 1.4 cm to 1.9x 1 x 1 cm.    Her breast mass improved with her neoadjuvant therapy.

## 2012-07-24 NOTE — Telephone Encounter (Signed)
Pt LVMOM requesting results from MRI completed at [facility] 02/10. Next sched appt 02/26 L/MD. Mssg forwarded to MD/LP.

## 2012-07-24 NOTE — Telephone Encounter (Signed)
Per NP, informed patient that breast mass has decreased from 3.0 x 1.9 cm x 1.4 to 1.9 x 1x 1 cm, and had improved with neoadjuvant therapy. Patient expressed gratitude. No further questions at this time. Patient knows to call office with any questions or concerns.

## 2012-07-29 ENCOUNTER — Encounter: Payer: Self-pay | Admitting: Oncology

## 2012-07-29 ENCOUNTER — Ambulatory Visit (HOSPITAL_BASED_OUTPATIENT_CLINIC_OR_DEPARTMENT_OTHER): Payer: Medicare Other | Admitting: Oncology

## 2012-07-29 ENCOUNTER — Other Ambulatory Visit (HOSPITAL_BASED_OUTPATIENT_CLINIC_OR_DEPARTMENT_OTHER): Payer: Medicare Other | Admitting: Lab

## 2012-07-29 ENCOUNTER — Telehealth: Payer: Self-pay | Admitting: Internal Medicine

## 2012-07-29 VITALS — BP 152/81 | HR 87 | Temp 98.4°F | Resp 20 | Ht 61.1 in | Wt 186.6 lb

## 2012-07-29 DIAGNOSIS — Z170 Estrogen receptor positive status [ER+]: Secondary | ICD-10-CM

## 2012-07-29 DIAGNOSIS — C50419 Malignant neoplasm of upper-outer quadrant of unspecified female breast: Secondary | ICD-10-CM

## 2012-07-29 LAB — CBC WITH DIFFERENTIAL/PLATELET
EOS%: 5.9 % (ref 0.0–7.0)
Eosinophils Absolute: 0.7 10*3/uL — ABNORMAL HIGH (ref 0.0–0.5)
LYMPH%: 22.9 % (ref 14.0–49.7)
MCH: 27 pg (ref 25.1–34.0)
MCV: 82.7 fL (ref 79.5–101.0)
MONO%: 8.4 % (ref 0.0–14.0)
NEUT#: 7.4 10*3/uL — ABNORMAL HIGH (ref 1.5–6.5)
Platelets: 234 10*3/uL (ref 145–400)
RBC: 4.9 10*6/uL (ref 3.70–5.45)

## 2012-07-29 LAB — FERRITIN: Ferritin: 18 ng/mL (ref 10–291)

## 2012-07-29 LAB — COMPREHENSIVE METABOLIC PANEL (CC13)
ALT: 38 U/L (ref 0–55)
CO2: 25 mEq/L (ref 22–29)
Calcium: 9.7 mg/dL (ref 8.4–10.4)
Chloride: 106 mEq/L (ref 98–107)
Creatinine: 0.7 mg/dL (ref 0.6–1.1)
Sodium: 142 mEq/L (ref 136–145)
Total Protein: 7.2 g/dL (ref 6.4–8.3)

## 2012-07-29 LAB — IRON AND TIBC: %SAT: 8 % — ABNORMAL LOW (ref 20–55)

## 2012-07-29 NOTE — Patient Instructions (Addendum)
Continue arimidex daily  MRI in 3 months  I will see you back in 3 months  Referral back to Dr. Derrell Lolling

## 2012-07-29 NOTE — Telephone Encounter (Signed)
gv pt appt schedule for May. lmonvm for [place] Ponder @ [facility] re contacting pt for breast mri appt approx 5/12. Lynden Ang made aware pt will see KK again 5/20.

## 2012-07-29 NOTE — Progress Notes (Signed)
OFFICE PROGRESS NOTE  CC Dr. [name] Dr. Claud Kelp Dr. Chipper Herb Dr. Huel Cote  DIAGNOSIS: 67 year old female with invasive lobular carcinoma of the left breast in the upper-outer quadrant stage IIA.  PRIOR THERAPY:  #1 patient originally presented to the multidisciplinary breast clinic in September 2013 with new diagnosis of left invasive lobular carcinoma by ultrasound measuring 1.6 and by MRI measuring 3 cm. She had undergone a needle core biopsy the tumor was lobular ER positive.  #2 she was seen by Dr. Dayton Scrape as well as Dr. Claud Kelp patient was interested in breast conservation. We recommended neoadjuvant antiestrogen therapy to try to reduce the size of the tumor for a good cosmetic result at this time of lumpectomy. She went on to receive letrozole 2.5 mg daily starting in 02/05/2012. Thus far she is tolerating it well.  CURRENT THERAPY: Neoadjuvant letrozole 2.5 mg daily since 02/05/2012  INTERVAL HISTORY: Elizabeth Hebert 67 y.o. female returns for followup visit at 3 months. She has been consistently taking her letrozole. She really has no problems except for some fatigue. She denies any ostial arthritis. She has no nausea vomiting fevers chills night sweats. She is taking her vitamin D as well as vitamin D3. She occasionally does experience hot flashes. Remainder of the 10 point review of systems is negative.  MEDICAL HISTORY: Past Medical History  Diagnosis Date  . Myocardial infarction   . Colitis   . Diverticulitis   . Fibroid tumor   . Anemia   . Broken arm 2011  . Hx of hernia repair   . History of cataract surgery 2012    ALLERGIES:  is allergic to vasotec.  MEDICATIONS:  Current Outpatient Prescriptions  Medication Sig Dispense Refill  . amLODipine-olmesartan (AZOR) 5-40 MG per tablet Take 1 tablet by mouth daily.      . Ascorbic Acid (VITAMIN C) 100 MG tablet Take 100 mg by mouth daily.      Marland Kitchen aspirin 81 MG tablet Take 81  mg by mouth daily.      . calcium-vitamin D (OSCAL WITH D) 250-125 MG-UNIT per tablet Take 1 tablet by mouth daily.      . cimetidine (TAGAMET) 200 MG tablet Take 200 mg by mouth 4 (four) times daily.      . colesevelam (WELCHOL) 625 MG tablet Take 1,875 mg by mouth 2 (two) times daily with a meal.      . Cranberry 500 MG CAPS Take 1 capsule by mouth daily.      . digoxin (LANOXIN) 0.125 MG tablet Take 0.125 mg by mouth daily.      Marland Kitchen ezetimibe (ZETIA) 10 MG tablet Take 10 mg by mouth daily.      . ferrous sulfate 325 (65 FE) MG tablet Take 325 mg by mouth daily with breakfast.      . glimepiride (AMARYL) 4 MG tablet Take 4 mg by mouth daily before breakfast.      . letrozole (FEMARA) 2.5 MG tablet       . metFORMIN (GLUCOPHAGE) 1000 MG tablet Take 1,000 mg by mouth 2 (two) times daily with a meal.      . metoprolol succinate (TOPROL-XL) 50 MG 24 hr tablet Take 50 mg by mouth daily. Take with or immediately following a meal.      . Multiple Vitamin (MULTIVITAMIN) capsule Take 1 capsule by mouth daily.      . nitroGLYCERIN (NITROSTAT) 0.4 MG SL tablet Place 0.4 mg under the tongue every 5 (  five) minutes as needed.      . rosuvastatin (CRESTOR) 20 MG tablet Take 20 mg by mouth daily.      . sertraline (ZOLOFT) 50 MG tablet Take 50 mg by mouth daily.      . sitaGLIPtin (JANUVIA) 100 MG tablet Take 100 mg by mouth daily.       No current facility-administered medications for this visit.    SURGICAL HISTORY:  Past Surgical History  Procedure Laterality Date  . Cholecystectomy    . Colon resection    . Total abdominal hysterectomy  2001    REVIEW OF SYSTEMS:  Pertinent items are noted in HPI.   HEALTH MAINTENANCE:  PHYSICAL EXAMINATION: Blood pressure 152/81, pulse 87, temperature 98.4 F (36.9 C), temperature source Oral, resp. rate 20, height 5' 1.1" (1.552 m), weight 186 lb 9.6 oz (84.641 kg). Body mass index is 35.14 kg/(m^2). ECOG PERFORMANCE STATUS: 0 - Asymptomatic   General  appearance: alert, cooperative and appears stated age Neck: no adenopathy, no carotid bruit, no JVD, supple, symmetrical, trachea midline and thyroid not enlarged, symmetric, no tenderness/mass/nodules Lymph nodes: Cervical, supraclavicular, and axillary nodes normal. Resp: clear to auscultation bilaterally Back: symmetric, no curvature. ROM normal. No CVA tenderness. Cardio: regular rate and rhythm GI: soft, non-tender; bowel sounds normal; no masses,  no organomegaly Extremities: extremities normal, atraumatic, no cyanosis or edema Neurologic: Grossly normal Left breast examination no skin nodularity I am barely able to palpate the mass there is no nipple discharge or retraction or inversion. Right breast no masses or nipple discharge.  LABORATORY DATA: Lab Results  Component Value Date   WBC 11.9* 07/29/2012   HGB 13.2 07/29/2012   HCT 40.5 07/29/2012   MCV 82.7 07/29/2012   PLT 234 07/29/2012      Chemistry      Component Value Date/Time   NA 142 07/29/2012 1334   K 3.8 07/29/2012 1334   CL 106 07/29/2012 1334   CO2 25 07/29/2012 1334   BUN 7.1 07/29/2012 1334   CREATININE 0.7 07/29/2012 1334      Component Value Date/Time   CALCIUM 9.7 07/29/2012 1334   ALKPHOS 71 07/29/2012 1334   AST 32 07/29/2012 1334   ALT 38 07/29/2012 1334   BILITOT 0.54 07/29/2012 1334      RADIOGRAPHIC STUDIES:  BILATERAL BREAST MRI WITH AND WITHOUT CONTRAST  Technique: Multiplanar, multisequence MR images of both breasts  were obtained prior to and following the intravenous administration  of 17ml of Multihance. Three dimensional images were evaluated at  the independent DynaCad workstation.  Comparison: 01/31/2012 breast MRI.  Findings: There is minimal bilateral parenchymal background  enhancement. The previously seen 3.0 x 1.9 x 1.4 cm irregular mass-  like enhancement located within the left upper-outer quadrant has  markedly decreased in enhancement and has decreased in size now  measuring 1.9 x  1.0 x 1.0 cm in size. There are no other areas of  worrisome enhancement within either breast. The previously  discussed left axillary lymph nodes appear stable. There is no  evidence for axillary adenopathy or internal mammary adenopathy.  There are no additional findings.  IMPRESSION:  Interval decrease in size and enhancement associated with the left  breast mass located within the upper-outer quadrant now measuring  1.9 x 1.0 x 1.0 cm in size. No additional findings.  RECOMMENDATION:  Treatment plan.   ASSESSMENT: 67 year old female with  #1 stage IIa invasive lobular carcinoma measuring 3.0 cm of the left breast status post  needle core biopsy performed in August 2013. Patient is now on neoadjuvant antiestrogen therapy with letrozole 2.5 mg daily. She seems to be tolerating it very well.  #2 patient had MRI of the breasts performed she is responding to the letrozole as noted above in the MRI results.  PLAN:   #1 patient will continue letrozole 2.5 mg daily.  #2 she will return in 3 months time for followup but prior to that we will get MRI of the breasts performed to evaluate response to therapy.   All questions were answered. The patient knows to call the clinic with any problems, questions or concerns. We can certainly see the patient much sooner if necessary.  I spent 25 minutes counseling the patient face to face. The total time spent in the appointment was 30 minutes.    Drue Second, MD Medical/Oncology [facility] [phone] (beeper) [phone] (Office)  07/29/2012, 2:21 PM

## 2012-08-25 DIAGNOSIS — E7800 Pure hypercholesterolemia, unspecified: Secondary | ICD-10-CM | POA: Diagnosis not present

## 2012-08-25 DIAGNOSIS — E1139 Type 2 diabetes mellitus with other diabetic ophthalmic complication: Secondary | ICD-10-CM | POA: Diagnosis not present

## 2012-08-25 DIAGNOSIS — F325 Major depressive disorder, single episode, in full remission: Secondary | ICD-10-CM | POA: Diagnosis not present

## 2012-08-25 DIAGNOSIS — J069 Acute upper respiratory infection, unspecified: Secondary | ICD-10-CM | POA: Diagnosis not present

## 2012-08-25 DIAGNOSIS — I2510 Atherosclerotic heart disease of native coronary artery without angina pectoris: Secondary | ICD-10-CM | POA: Diagnosis not present

## 2012-08-25 DIAGNOSIS — E1165 Type 2 diabetes mellitus with hyperglycemia: Secondary | ICD-10-CM | POA: Diagnosis not present

## 2012-08-25 DIAGNOSIS — I10 Essential (primary) hypertension: Secondary | ICD-10-CM | POA: Diagnosis not present

## 2012-08-25 DIAGNOSIS — E11359 Type 2 diabetes mellitus with proliferative diabetic retinopathy without macular edema: Secondary | ICD-10-CM | POA: Diagnosis not present

## 2012-10-20 ENCOUNTER — Encounter: Payer: Self-pay | Admitting: Oncology

## 2012-10-20 ENCOUNTER — Other Ambulatory Visit: Payer: Self-pay | Admitting: Oncology

## 2012-10-20 ENCOUNTER — Telehealth: Payer: Self-pay | Admitting: Oncology

## 2012-10-20 ENCOUNTER — Other Ambulatory Visit (HOSPITAL_BASED_OUTPATIENT_CLINIC_OR_DEPARTMENT_OTHER): Payer: Medicare Other | Admitting: Lab

## 2012-10-20 ENCOUNTER — Ambulatory Visit (HOSPITAL_BASED_OUTPATIENT_CLINIC_OR_DEPARTMENT_OTHER): Payer: Medicare Other | Admitting: Oncology

## 2012-10-20 VITALS — BP 146/85 | HR 77 | Temp 98.5°F | Resp 20 | Ht 61.1 in | Wt 182.0 lb

## 2012-10-20 DIAGNOSIS — C50412 Malignant neoplasm of upper-outer quadrant of left female breast: Secondary | ICD-10-CM

## 2012-10-20 DIAGNOSIS — C50419 Malignant neoplasm of upper-outer quadrant of unspecified female breast: Secondary | ICD-10-CM

## 2012-10-20 DIAGNOSIS — Z170 Estrogen receptor positive status [ER+]: Secondary | ICD-10-CM

## 2012-10-20 LAB — COMPREHENSIVE METABOLIC PANEL (CC13)
ALT: 43 U/L (ref 0–55)
CO2: 28 mEq/L (ref 22–29)
Calcium: 9.8 mg/dL (ref 8.4–10.4)
Chloride: 102 mEq/L (ref 98–107)
Sodium: 141 mEq/L (ref 136–145)
Total Bilirubin: 0.54 mg/dL (ref 0.20–1.20)
Total Protein: 7 g/dL (ref 6.4–8.3)

## 2012-10-20 LAB — CBC WITH DIFFERENTIAL/PLATELET
BASO%: 0.8 % (ref 0.0–2.0)
MCHC: 31.9 g/dL (ref 31.5–36.0)
MONO#: 0.9 10*3/uL (ref 0.1–0.9)
RBC: 5.05 10*6/uL (ref 3.70–5.45)
RDW: 16.2 % — ABNORMAL HIGH (ref 11.2–14.5)
WBC: 9.4 10*3/uL (ref 3.9–10.3)
lymph#: 2.5 10*3/uL (ref 0.9–3.3)

## 2012-10-20 NOTE — Patient Instructions (Addendum)
Doing well  We will get MRI of breasts for evaluation of response to therapy  Refer to Dr. Derrell Lolling  I will see you back in 1 month

## 2012-10-20 NOTE — Progress Notes (Signed)
OFFICE PROGRESS NOTE  CC Dr. [name] Dr. Claud Kelp Dr. Chipper Herb Dr. Huel Cote  DIAGNOSIS: 67 year old female with invasive lobular carcinoma of the left breast in the upper-outer quadrant stage IIA.  PRIOR THERAPY:  #1 patient originally presented to the multidisciplinary breast clinic in September 2013 with new diagnosis of left invasive lobular carcinoma by ultrasound measuring 1.6 and by MRI measuring 3 cm. She had undergone a needle core biopsy the tumor was lobular ER positive.  #2 she was seen by Dr. Dayton Scrape as well as Dr. Claud Kelp patient was interested in breast conservation. We recommended neoadjuvant antiestrogen therapy to try to reduce the size of the tumor for a good cosmetic result at this time of lumpectomy. She went on to receive letrozole 2.5 mg daily starting in 02/05/2012. Thus far she is tolerating it well.  CURRENT THERAPY: Neoadjuvant letrozole 2.5 mg daily since 02/05/2012  INTERVAL HISTORY: Elizabeth Hebert 67 y.o. female returns for followup visit at 3 months. She has been consistently taking her letrozole. She really has no problems except for some fatigue. She denies any ostial arthritis. She has no nausea vomiting fevers chills night sweats. She is taking her vitamin D as well as vitamin D3. She occasionally does experience hot flashes. Remainder of the 10 point review of systems is negative.  MEDICAL HISTORY: Past Medical History  Diagnosis Date  . Myocardial infarction   . Colitis   . Diverticulitis   . Fibroid tumor   . Anemia   . Broken arm 2011  . Hx of hernia repair   . History of cataract surgery 2012    ALLERGIES:  is allergic to vasotec.  MEDICATIONS:  Current Outpatient Prescriptions  Medication Sig Dispense Refill  . amLODipine-olmesartan (AZOR) 5-40 MG per tablet Take 1 tablet by mouth daily.      . Ascorbic Acid (VITAMIN C) 100 MG tablet Take 100 mg by mouth daily.      Marland Kitchen aspirin 81 MG tablet Take 81  mg by mouth daily.      . calcium-vitamin D (OSCAL WITH D) 250-125 MG-UNIT per tablet Take 1 tablet by mouth daily.      . cimetidine (TAGAMET) 200 MG tablet Take 200 mg by mouth 4 (four) times daily.      . Cranberry 500 MG CAPS Take 1 capsule by mouth daily.      . digoxin (LANOXIN) 0.125 MG tablet Take 0.125 mg by mouth daily.      . Exenatide ER (BYDUREON) 2 MG SUSR Inject into the skin once a week.      . ezetimibe (ZETIA) 10 MG tablet Take 10 mg by mouth daily.      . ferrous sulfate 325 (65 FE) MG tablet Take 325 mg by mouth daily with breakfast.      . glimepiride (AMARYL) 4 MG tablet Take 4 mg by mouth daily before breakfast.      . letrozole (FEMARA) 2.5 MG tablet       . metFORMIN (GLUCOPHAGE) 1000 MG tablet Take 1,000 mg by mouth 2 (two) times daily with a meal.      . metoprolol succinate (TOPROL-XL) 50 MG 24 hr tablet Take 50 mg by mouth daily. Take with or immediately following a meal.      . Multiple Vitamin (MULTIVITAMIN) capsule Take 1 capsule by mouth daily.      . rosuvastatin (CRESTOR) 20 MG tablet Take 20 mg by mouth daily.      . sertraline (  ZOLOFT) 50 MG tablet Take 50 mg by mouth daily.      . sitaGLIPtin (JANUVIA) 100 MG tablet Take 100 mg by mouth daily.      . nitroGLYCERIN (NITROSTAT) 0.4 MG SL tablet Place 0.4 mg under the tongue every 5 (five) minutes as needed.       No current facility-administered medications for this visit.    SURGICAL HISTORY:  Past Surgical History  Procedure Laterality Date  . Cholecystectomy    . Colon resection    . Total abdominal hysterectomy  2001    REVIEW OF SYSTEMS:  Pertinent items are noted in HPI.   HEALTH MAINTENANCE:  PHYSICAL EXAMINATION: Blood pressure 146/85, pulse 77, temperature 98.5 F (36.9 C), temperature source Oral, resp. rate 20, height 5' 1.1" (1.552 m), weight 182 lb (82.555 kg). Body mass index is 34.27 kg/(m^2). ECOG PERFORMANCE STATUS: 0 - Asymptomatic   General appearance: alert, cooperative and  appears stated age Neck: no adenopathy, no carotid bruit, no JVD, supple, symmetrical, trachea midline and thyroid not enlarged, symmetric, no tenderness/mass/nodules Lymph nodes: Cervical, supraclavicular, and axillary nodes normal. Resp: clear to auscultation bilaterally Back: symmetric, no curvature. ROM normal. No CVA tenderness. Cardio: regular rate and rhythm GI: soft, non-tender; bowel sounds normal; no masses,  no organomegaly Extremities: extremities normal, atraumatic, no cyanosis or edema Neurologic: Grossly normal Left breast examination no skin nodularity I am barely able to palpate the mass there is no nipple discharge or retraction or inversion. Right breast no masses or nipple discharge.  LABORATORY DATA: Lab Results  Component Value Date   WBC 9.4 10/20/2012   HGB 13.4 10/20/2012   HCT 41.9 10/20/2012   MCV 83.0 10/20/2012   PLT 206 10/20/2012      Chemistry      Component Value Date/Time   NA 141 10/20/2012 0816   K 4.1 10/20/2012 0816   CL 102 10/20/2012 0816   CO2 28 10/20/2012 0816   BUN 4.9* 10/20/2012 0816   CREATININE 0.8 10/20/2012 0816      Component Value Date/Time   CALCIUM 9.8 10/20/2012 0816   ALKPHOS 65 10/20/2012 0816   AST 39* 10/20/2012 0816   ALT 43 10/20/2012 0816   BILITOT 0.54 10/20/2012 0816      RADIOGRAPHIC STUDIES:  BILATERAL BREAST MRI WITH AND WITHOUT CONTRAST  Technique: Multiplanar, multisequence MR images of both breasts  were obtained prior to and following the intravenous administration  of 17ml of Multihance. Three dimensional images were evaluated at  the independent DynaCad workstation.  Comparison: 01/31/2012 breast MRI.  Findings: There is minimal bilateral parenchymal background  enhancement. The previously seen 3.0 x 1.9 x 1.4 cm irregular mass-  like enhancement located within the left upper-outer quadrant has  markedly decreased in enhancement and has decreased in size now  measuring 1.9 x 1.0 x 1.0 cm in size. There are no  other areas of  worrisome enhancement within either breast. The previously  discussed left axillary lymph nodes appear stable. There is no  evidence for axillary adenopathy or internal mammary adenopathy.  There are no additional findings.  IMPRESSION:  Interval decrease in size and enhancement associated with the left  breast mass located within the upper-outer quadrant now measuring  1.9 x 1.0 x 1.0 cm in size. No additional findings.  RECOMMENDATION:  Treatment plan.   ASSESSMENT: 67 year old female with  #1 stage IIa invasive lobular carcinoma measuring 3.0 cm of the left breast status post needle core biopsy performed in August  2013. Patient is now on neoadjuvant antiestrogen therapy with letrozole 2.5 mg daily. She seems to be tolerating it very well.  #2 patient had MRI of the breasts performed she is responding to the letrozole as noted above in the MRI results.  PLAN:   #1 patient will continue letrozole 2.5 mg daily.  #2 I have ordered MRI of the breasts for evaluation for response to therapy.  #3 I've also sent a message to Dr. Claud Kelp for followup for eventual surgery.  #4 I will see her back in about 4-6 weeks time.  All questions were answered. The patient knows to call the clinic with any problems, questions or concerns. We can certainly see the patient much sooner if necessary.  I spent 25 minutes counseling the patient face to face. The total time spent in the appointment was 30 minutes.    Drue Second, MD Medical/Oncology [facility] [phone] (beeper) [phone] (Office)  10/20/2012, 9:23 AM

## 2012-10-21 ENCOUNTER — Telehealth (INDEPENDENT_AMBULATORY_CARE_PROVIDER_SITE_OTHER): Payer: Self-pay

## 2012-10-21 ENCOUNTER — Telehealth: Payer: Self-pay | Admitting: Oncology

## 2012-10-21 NOTE — Telephone Encounter (Signed)
I called the pt to come in and see Dr Derrell Lolling after her MRI in June to discuss surgery.  Appointment scheduled for 6/19 9am

## 2012-11-04 ENCOUNTER — Ambulatory Visit
Admission: RE | Admit: 2012-11-04 | Discharge: 2012-11-04 | Disposition: A | Discharge status: Home / Self Care | Payer: Medicare Other | Source: Ambulatory Visit | Attending: Oncology | Admitting: Oncology

## 2012-11-04 DIAGNOSIS — C50412 Malignant neoplasm of upper-outer quadrant of left female breast: Secondary | ICD-10-CM

## 2012-11-04 DIAGNOSIS — Z01818 Encounter for other preprocedural examination: Secondary | ICD-10-CM | POA: Diagnosis not present

## 2012-11-04 DIAGNOSIS — C50919 Malignant neoplasm of unspecified site of unspecified female breast: Secondary | ICD-10-CM | POA: Diagnosis not present

## 2012-11-09 ENCOUNTER — Other Ambulatory Visit: Payer: Medicare Other

## 2012-11-15 ENCOUNTER — Ambulatory Visit
Admission: RE | Admit: 2012-11-15 | Discharge: 2012-11-15 | Disposition: A | Discharge status: Home / Self Care | Payer: Medicare Other | Source: Ambulatory Visit | Attending: Oncology | Admitting: Oncology

## 2012-11-15 DIAGNOSIS — C50412 Malignant neoplasm of upper-outer quadrant of left female breast: Secondary | ICD-10-CM

## 2012-11-15 DIAGNOSIS — C50919 Malignant neoplasm of unspecified site of unspecified female breast: Secondary | ICD-10-CM | POA: Diagnosis not present

## 2012-11-15 MED ORDER — GADOBENATE DIMEGLUMINE 529 MG/ML IV SOLN
17.0000 mL | Freq: Once | INTRAVENOUS | Status: AC | PRN
  Administered 2012-11-15: 17 mL via INTRAVENOUS

## 2012-11-18 ENCOUNTER — Ambulatory Visit: Payer: Medicare Other | Admitting: Oncology

## 2012-11-19 ENCOUNTER — Encounter (INDEPENDENT_AMBULATORY_CARE_PROVIDER_SITE_OTHER): Payer: Self-pay | Admitting: General Surgery

## 2012-11-19 ENCOUNTER — Ambulatory Visit (INDEPENDENT_AMBULATORY_CARE_PROVIDER_SITE_OTHER): Payer: Medicare Other | Admitting: General Surgery

## 2012-11-19 VITALS — BP 138/82 | HR 84 | Temp 98.0°F | Resp 18 | Ht 61.0 in | Wt 182.0 lb

## 2012-11-19 DIAGNOSIS — C50419 Malignant neoplasm of upper-outer quadrant of unspecified female breast: Secondary | ICD-10-CM

## 2012-11-19 DIAGNOSIS — C50412 Malignant neoplasm of upper-outer quadrant of left female breast: Secondary | ICD-10-CM

## 2012-11-19 NOTE — Progress Notes (Signed)
Patient ID: Elizabeth Hebert, female   DOB: [birth year], 67 y.o.   MRN: [id number]  Chief Complaint  Patient presents with  . Breast Cancer Long Term Follow Up    HPI Elizabeth Hebert is a 67 y.o. female.  She returns following neoadjuvant letrozole for planning of her definitive surgery for her left breast cancer, upper outer quadrant.   She was initially referred by [name] at the breast center of [facility] for evaluation and management of an invasive cancer in the left breast, upper outer quadrant.  The patient did not perceive a problem in the left breast. She had not had a mammogram in 5 years, and Dr. Huel Cote sent her for screening mammograms. They found a 1.3 cm mass in the left breast at the 2:00 position, 15 cm from the nipple. Ultrasound left axilla was normal. Image guided biopsy of this area showed invasive cancer, lobular phenotype, receptor positive, HER-2/neu negative.  Subsequent MRI showed a solitary mass in the left breast upper outer quadrant but it looked much larger, 3 cm. at least. Left axilla lymph node showed nonspecific changes.   She ws being seen in the [facility] by me, Dr. Drue Second, and Dr. Chipper Herb on 02/05/2012.Decision was made to treat her with neoadjuvant letrozole, in hopes of improving the margins for a lumpectomy. She has done well with this other than hot flashes. Clinically she has responded with reduction in size of the left breast mass by physical exam. Initial MRI suggested that this mass was about 3.5 cm.. This is now about 2.1 cm and there are no abnormal lymph nodes.  Past history is significant for coronary disease with 2 stents placed by DR. Al [phone] . She had an MI at that time. No subsequent coronary events. She's had open cholecystectomy and appendectomy 1970s. Colon resection for inflammatory bowel disease and hysterectomy and BSO at the same time.Followed by [name]. She has non-insulin dependent diabetes. She's had  no prior history of breast problems.   Family History reveals breast cancer in a paternal grandmother and a maternal grandmother. No family history of ovarian cancer. No family history of prostate cancer. Maternal aunt had pancreatic cancer   HPI  Past Medical History  Diagnosis Date  . Myocardial infarction   . Colitis   . Diverticulitis   . Fibroid tumor   . Anemia   . Broken arm 2011  . Hx of hernia repair   . History of cataract surgery 2012    Past Surgical History  Procedure Laterality Date  . Cholecystectomy    . Colon resection    . Total abdominal hysterectomy  2001    Family History  Problem Relation Age of Onset  . Breast cancer Maternal Grandmother 47  . Cervical cancer Paternal Grandmother 60  . Pancreatic cancer Maternal Aunt     diagnosed in her 1s  . Cancer Cousin     maternal cousin died of a cancer that started in his eye    Social History History  Substance Use Topics  . Smoking status: Former Smoker -- 1.00 packs/day for 30 years    Quit date: 06/03/1997  . Smokeless tobacco: Not on file  . Alcohol Use: 0.0 oz/week    0-1 Glasses of wine per week    Allergies  Allergen Reactions  . Vasotec (Enalaprilat) Rash    Current Outpatient Prescriptions  Medication Sig Dispense Refill  . amLODipine-olmesartan (AZOR) 5-40 MG per tablet Take 1 tablet by mouth daily.      Marland Kitchen  Ascorbic Acid (VITAMIN C) 100 MG tablet Take 100 mg by mouth daily.      Marland Kitchen aspirin 81 MG tablet Take 81 mg by mouth daily.      . calcium-vitamin D (OSCAL WITH D) 250-125 MG-UNIT per tablet Take 1 tablet by mouth daily.      . cimetidine (TAGAMET) 200 MG tablet Take 200 mg by mouth 4 (four) times daily.      . Cranberry 500 MG CAPS Take 1 capsule by mouth daily.      . digoxin (LANOXIN) 0.125 MG tablet Take 0.125 mg by mouth daily.      . Exenatide ER (BYDUREON) 2 MG SUSR Inject into the skin once a week.      . ezetimibe (ZETIA) 10 MG tablet Take 10 mg by mouth daily.      .  ferrous sulfate 325 (65 FE) MG tablet Take 325 mg by mouth daily with breakfast.      . glimepiride (AMARYL) 4 MG tablet Take 4 mg by mouth daily before breakfast.      . letrozole (FEMARA) 2.5 MG tablet       . metFORMIN (GLUCOPHAGE) 1000 MG tablet Take 1,000 mg by mouth 2 (two) times daily with a meal.      . metoprolol succinate (TOPROL-XL) 50 MG 24 hr tablet Take 50 mg by mouth daily. Take with or immediately following a meal.      . Multiple Vitamin (MULTIVITAMIN) capsule Take 1 capsule by mouth daily.      . nitroGLYCERIN (NITROSTAT) 0.4 MG SL tablet Place 0.4 mg under the tongue every 5 (five) minutes as needed.      . rosuvastatin (CRESTOR) 20 MG tablet Take 20 mg by mouth daily.      . sertraline (ZOLOFT) 50 MG tablet Take 50 mg by mouth daily.      . sitaGLIPtin (JANUVIA) 100 MG tablet Take 100 mg by mouth daily.       No current facility-administered medications for this visit.    Review of Systems Review of Systems  Constitutional: Negative for fever, chills and unexpected weight change.  HENT: Negative for hearing loss, congestion, sore throat, trouble swallowing and voice change.   Eyes: Negative for visual disturbance.  Respiratory: Negative for cough and wheezing (Denies chest pain, shortness of breath, cough, exertional dyspnea. No cardiac symptoms.).   Cardiovascular: Negative for chest pain, palpitations and leg swelling.       No chest pain, shortness of breath, exertional symptoms. No cardiac symptoms.  Gastrointestinal: Negative for nausea, vomiting, abdominal pain, diarrhea, constipation, blood in stool, abdominal distention and anal bleeding.  Endocrine: Positive for heat intolerance.  Genitourinary: Negative for hematuria, vaginal bleeding and difficulty urinating.  Musculoskeletal: Negative for arthralgias.  Skin: Negative for rash and wound.  Neurological: Negative for seizures, syncope and headaches.  Hematological: Negative for adenopathy. Does not  bruise/bleed easily.  Psychiatric/Behavioral: Negative for confusion.    Blood pressure 138/82, pulse 84, temperature 98 F (36.7 C), temperature source Oral, resp. rate 18, height 5\' 1"  (1.549 m), weight 182 lb (82.555 kg).  Physical Exam Physical Exam  Constitutional: She is oriented to person, place, and time. She appears well-developed and well-nourished. No distress.  HENT:  Head: Normocephalic and atraumatic.  Nose: Nose normal.  Mouth/Throat: No oropharyngeal exudate.  Eyes: Conjunctivae and EOM are normal. Pupils are equal, round, and reactive to light. Left eye exhibits no discharge. No scleral icterus.  Neck: Neck supple. No JVD present. No  tracheal deviation present. No thyromegaly present.  Cardiovascular: Normal rate, regular rhythm, normal heart sounds and intact distal pulses.   No murmur heard. Pulmonary/Chest: Effort normal and breath sounds normal. No respiratory distress. She has no wheezes. She has no rales. She exhibits no tenderness.  Focused exam of the left breast reveals that the previously palpable mass is much less distinct, possibly nonpalpable. No skin change. No adenopathy.  Abdominal: Soft. Bowel sounds are normal. She exhibits no distension and no mass. There is no tenderness. There is no rebound and no guarding.  Right paramedian scar. Midline scar.  Musculoskeletal: She exhibits no edema and no tenderness.  Lymphadenopathy:    She has no cervical adenopathy.  Neurological: She is alert and oriented to person, place, and time. She exhibits normal muscle tone. Coordination normal.  Skin: Skin is warm. No rash noted. She is not diaphoretic. No erythema. No pallor.  Psychiatric: She has a normal mood and affect. Her behavior is normal. Judgment and thought content normal.    Data Reviewed Recent cancer Center notes. Recent MRI.  Assessment    Invasive mammary carcinoma, upper outer quadrant left breast, probable lobular phenotype, receptor positive,  HER-2-negative, initial  clinical stage T2, N0.   She is strongly motivated for breast conservation surgery, and I think hat she is a good candidate at this point in time.Marland Kitchen   History myocardial infarction  History cardiac catheterization with 2 Stents in Place.  History of multiple abdominal operations  Non-insulin dependent diabetes mellitus  Obesity     Plan    Scheduled for left partial mastectomy with needle localization, left axillary sentinel node biopsy  We will ask Dr. Cam Hai for preoperative medical and cardiac clearance. She is no longer followed by a cardiologist  I discussed the indications, details, techniques, and numerous risk of the surgery with the patient and her husband. She is aware of risk of bleeding, infection, cosmetic deformity, reoperation for positive margins are positive nodes, or swelling, or numbness, and other unforeseen problems. She understands these issues and all of her questions are answered. She agrees with this plan.       [name]. Derrell Lolling, M.D., [facility] General and Minimally invasive Surgery Breast and Colorectal Surgery Office:   [phone] Pager:   [phone]  11/19/2012, 9:21 AM

## 2012-11-19 NOTE — Patient Instructions (Signed)
You have responded reasonably well to the neoadjuvant letrozole treatment. The tumor is small and difficult to feel now.  You will be scheduled for left partial mastectomy with needle localization, and left axillary sentinel node biopsy.  We will ask Dr. Virginia Crews provide medical clearance for you to proceed with the surgery.     Lumpectomy, Breast Conserving Surgery A lumpectomy is breast surgery that removes only part of the breast. Another name used may be partial mastectomy. The amount removed varies. Make sure you understand how much of your breast will be removed. Reasons for a lumpectomy:  Any solid breast mass.  Grouped significant nodularity that may be confused with a solitary breast mass. Lumpectomy is the most common form of breast cancer surgery today. The surgeon removes the portion of your breast which contains the tumor (cancer). This is the lump. Some normal tissue around the lump is also removed to be sure that all the tumor has been removed.  If cancer cells are found in the margins where the breast tissue was removed, your surgeon will do more surgery to remove the remaining cancer tissue. This is called re-excision surgery. Radiation and/or chemotherapy treatments are often given following a lumpectomy to kill any cancer cells that could possibly remain.  REASONS YOU MAY NOT BE ABLE TO HAVE BREAST CONSERVING SURGERY:  The tumor is located in more than one place.  Your breast is small and the tumor is large so the breast would be disfigured.  The entire tumor removal is not successful with a lumpectomy.  You cannot commit to a full course of chemotherapy, radiation therapy or are pregnant and cannot have radiation.  You have previously had radiation to the breast to treat cancer. HOW A LUMPECTOMY IS PERFORMED If overnight nursing is not required following a biopsy, a lumpectomy can be performed as a same-day surgery. This can be done in a hospital, clinic, or  surgical center. The anesthesia used will depend on your surgeon. They will discuss this with you. A general anesthetic keeps you sleeping through the procedure. LET YOUR CAREGIVERS KNOW ABOUT THE FOLLOWING:  Allergies  Medications taken including herbs, eye drops, over the counter medications, and creams.  Use of steroids (by mouth or creams)  Previous problems with anesthetics or Novocaine.  Possibility of pregnancy, if this applies  History of blood clots (thrombophlebitis)  History of bleeding or blood problems.  Previous surgery  Other health problems BEFORE THE PROCEDURE You should be present one hour prior to your procedure unless directed otherwise.  AFTER THE PROCEDURE  After surgery, you will be taken to the recovery area where a nurse will watch and check your progress. Once you're awake, stable, and taking fluids well, barring other problems you will be allowed to go home.  Ice packs applied to your operative site may help with discomfort and keep the swelling down.  A small rubber drain may be placed in the breast for a couple of days to prevent a hematoma from developing in the breast.  A pressure dressing may be applied for 24 to 48 hours to prevent bleeding.  Keep the wound dry.  You may resume a normal diet and activities as directed. Avoid strenuous activities affecting the arm on the side of the biopsy site such as tennis, swimming, heavy lifting (more than 10 pounds) or pulling.  Bruising in the breast is normal following this procedure.  Wearing a bra - even to bed - may be more comfortable and also  help keep the dressing on.  Change dressings as directed.  Only take over-the-counter or prescription medicines for pain, discomfort, or fever as directed by your caregiver. Call for your results as instructed by your surgeon. Remember it is your responsibility to get the results of your lumpectomy if your surgeon asked you to follow-up. Do not assume  everything is fine if you have not heard from your caregiver. SEEK MEDICAL CARE IF:   There is increased bleeding (more than a small spot) from the wound.  You notice redness, swelling, or increasing pain in the wound.  Pus is coming from wound.  An unexplained oral temperature above 102 F (38.9 C) develops.  You notice a foul smell coming from the wound or dressing. SEEK IMMEDIATE MEDICAL CARE IF:   You develop a rash.  You have difficulty breathing.  You have any allergic problems. Document Released: 07/01/2006 Document Revised: 08/12/2011 Document Reviewed: 10/02/2006 [facility] Patient Information 2014 [place], [place].

## 2012-11-23 ENCOUNTER — Telehealth (INDEPENDENT_AMBULATORY_CARE_PROVIDER_SITE_OTHER): Payer: Self-pay

## 2012-11-23 ENCOUNTER — Telehealth (INDEPENDENT_AMBULATORY_CARE_PROVIDER_SITE_OTHER): Payer: Self-pay | Admitting: General Surgery

## 2012-11-23 DIAGNOSIS — I252 Old myocardial infarction: Secondary | ICD-10-CM

## 2012-11-23 NOTE — Telephone Encounter (Signed)
I had requested medical clearance on 6/19 from Dr [name].  She faxed back and states the patient needs to see a cardiologist for clearance.  The pt has seen Dr Clarene Duke in the past.  She states she will see whoever is taking his patients unless Dr Derrell Lolling has another recommendation.  I will call her back.

## 2012-11-23 NOTE — Telephone Encounter (Signed)
Appt made with Dr Rennis Golden at [facility] 11/26/2012 @ 11:15 am for preoperative clearance. Patient made aware.

## 2012-11-24 ENCOUNTER — Ambulatory Visit: Payer: Medicare Other | Admitting: Oncology

## 2012-11-25 ENCOUNTER — Telehealth: Payer: Self-pay | Admitting: Oncology

## 2012-11-26 ENCOUNTER — Encounter: Payer: Self-pay | Admitting: Internal Medicine

## 2012-11-26 ENCOUNTER — Ambulatory Visit (INDEPENDENT_AMBULATORY_CARE_PROVIDER_SITE_OTHER): Payer: Medicare Other | Admitting: Internal Medicine

## 2012-11-26 VITALS — BP 150/50 | HR 79 | Ht 61.0 in | Wt 182.1 lb

## 2012-11-26 DIAGNOSIS — I252 Old myocardial infarction: Secondary | ICD-10-CM

## 2012-11-26 DIAGNOSIS — Z6825 Body mass index (BMI) 25.0-25.9, adult: Secondary | ICD-10-CM

## 2012-11-26 DIAGNOSIS — Z01810 Encounter for preprocedural cardiovascular examination: Secondary | ICD-10-CM | POA: Insufficient documentation

## 2012-11-26 DIAGNOSIS — E663 Overweight: Secondary | ICD-10-CM | POA: Diagnosis not present

## 2012-11-26 DIAGNOSIS — I10 Essential (primary) hypertension: Secondary | ICD-10-CM

## 2012-11-26 DIAGNOSIS — I2510 Atherosclerotic heart disease of native coronary artery without angina pectoris: Secondary | ICD-10-CM | POA: Diagnosis not present

## 2012-11-26 NOTE — Progress Notes (Signed)
OFFICE NOTE  Chief Complaint:  Preoperative cardiovascular evaluation  Primary Care Physician: [name], MD  HPI:  Elizabeth Hebert is a pleasant 67 year old female who was previously followed by Dr. Clarene Duke in our practice. She has a history of coronary artery disease and an inferior MI in 1999. At that time she had 2 stents placed to the right coronary artery. In 2008 she had repeat cardiac catheterization showing 30% mild in-stent restenosis to an approximately placed stent. She's had no further angina or shortness of breath with exertion. She can walk up a flight of stairs without becoming short of breath and easily do other activities of greater than 4 metabolic "once without any limitation. Recently she was diagnosed with a breast cancer is started on medications. She is now being contemplated to have a lumpectomy and further treatment and is seeing me today for preoperative cardiovascular assessment.  PMHx:  Past Medical History  Diagnosis Date  . Myocardial infarction   . Colitis   . Diverticulitis   . Fibroid tumor   . Anemia   . Broken arm 2011  . Hx of hernia repair   . History of cataract surgery 2012    Past Surgical History  Procedure Laterality Date  . Cholecystectomy    . Colon resection    . Total abdominal hysterectomy  2001    FAMHx:  Family History  Problem Relation Age of Onset  . Breast cancer Maternal Grandmother 17  . Cervical cancer Paternal Grandmother 68  . Pancreatic cancer Maternal Aunt     diagnosed in her 85s  . Cancer Cousin     maternal cousin died of a cancer that started in his eye    SOCHx:   reports that she quit smoking about 15 years ago. She does not have any smokeless tobacco history on file. She reports that  drinks alcohol. She reports that she does not use illicit drugs.  ALLERGIES:  Allergies  Allergen Reactions  . Vasotec (Enalaprilat) Rash    ROS: A comprehensive review of systems was negative except for:  Integument/breast: positive for breast lump  HOME MEDS: Current Outpatient Prescriptions  Medication Sig Dispense Refill  . amLODipine-olmesartan (AZOR) 5-40 MG per tablet Take 1 tablet by mouth daily.      . Ascorbic Acid (VITAMIN C) 100 MG tablet Take 100 mg by mouth daily.      Marland Kitchen aspirin 81 MG tablet Take 81 mg by mouth daily.      . calcium-vitamin D (OSCAL WITH D) 250-125 MG-UNIT per tablet Take 1 tablet by mouth daily.      . cimetidine (TAGAMET) 200 MG tablet Take 200 mg by mouth 2 (two) times daily.       . colesevelam (WELCHOL) 625 MG tablet Take 1,875 mg by mouth 2 (two) times daily with a meal.      . Cranberry 500 MG CAPS Take 1 capsule by mouth daily.      . digoxin (LANOXIN) 0.125 MG tablet Take 0.125 mg by mouth daily.      . Exenatide ER (BYDUREON) 2 MG SUSR Inject into the skin once a week.      . ezetimibe (ZETIA) 10 MG tablet Take 10 mg by mouth daily.      . ferrous sulfate 325 (65 FE) MG tablet Take 325 mg by mouth daily with breakfast.      . glimepiride (AMARYL) 4 MG tablet Take 4 mg by mouth daily before breakfast.      .  letrozole (FEMARA) 2.5 MG tablet       . metFORMIN (GLUCOPHAGE) 1000 MG tablet Take 1,000 mg by mouth 2 (two) times daily with a meal.      . metoprolol succinate (TOPROL-XL) 50 MG 24 hr tablet Take 50 mg by mouth daily. Take with or immediately following a meal.      . Multiple Vitamin (MULTIVITAMIN) capsule Take 1 capsule by mouth daily.      . nitroGLYCERIN (NITROSTAT) 0.4 MG SL tablet Place 0.4 mg under the tongue every 5 (five) minutes as needed.      . rosuvastatin (CRESTOR) 20 MG tablet Take 20 mg by mouth daily.      . sertraline (ZOLOFT) 50 MG tablet Take 50 mg by mouth daily.      . sitaGLIPtin (JANUVIA) 100 MG tablet Take 100 mg by mouth daily.       No current facility-administered medications for this visit.    LABS/IMAGING: No results found for this or any previous visit (from the past 48 hour(s)). No results found.  VITALS: BP  150/50  Pulse 79  Ht 5\' 1"  (1.549 m)  Wt 182 lb 1.6 oz (82.6 kg)  BMI 34.43 kg/m2  EXAM: General appearance: alert and no distress Neck: no adenopathy, no carotid bruit, no JVD, supple, symmetrical, trachea midline and thyroid not enlarged, symmetric, no tenderness/mass/nodules Lungs: clear to auscultation bilaterally Heart: regular rate and rhythm, S1, S2 normal, no murmur, click, rub or gallop Abdomen: soft, non-tender; bowel sounds normal; no masses,  no organomegaly Extremities: extremities normal, atraumatic, no cyanosis or edema Pulses: 2+ and symmetric Skin: Skin color, texture, turgor normal. No rashes or lesions Neurologic: Grossly normal  EKG: Normal sinus rhythm at 79, small Q waves noted in 2, 3, aVF  ASSESSMENT: 1. Low to intermediate risk for a low-risk procedure 2. Coronary artery disease status post inferior MI, with a preserved ejection fraction 3. Prior stents to the RCA 4. Hypertension 5. Dyslipidemia  PLAN: 1.   Overall, I think [name] is at low risk for lumpectomy. She is describing no symptoms of angina or worsening shortness of breath with exertion. There is no history of valvular heart disease and her EF is preserved. I think that we should not delay and treating her breast cancer any further, I feel that she is at low to intermediate risk for what is considered to be a low-risk procedure. We will be happy to be available as needed should cardiovascular issues arise around the time of the procedure.  Given her history of coronary artery disease and prior MI, I think she should be followed by [place] at least annually and I re-iterated that today.  Chrystie Nose, MD, [facility] Attending Cardiologist [facility]  Blade Scheff C 11/26/2012, 5:38 PM

## 2012-11-26 NOTE — Patient Instructions (Addendum)
Your physician wants you to follow-up in: 1 year. You will receive a reminder letter in the mail two months in advance. If you don't receive a letter, please call our office to schedule the follow-up appointment.  

## 2012-11-27 ENCOUNTER — Telehealth: Payer: Self-pay | Admitting: Oncology

## 2012-11-27 ENCOUNTER — Telehealth (INDEPENDENT_AMBULATORY_CARE_PROVIDER_SITE_OTHER): Payer: Self-pay

## 2012-11-27 ENCOUNTER — Ambulatory Visit (HOSPITAL_BASED_OUTPATIENT_CLINIC_OR_DEPARTMENT_OTHER): Payer: Medicare Other | Admitting: Oncology

## 2012-11-27 VITALS — BP 148/80 | HR 76 | Temp 98.3°F | Resp 20 | Ht 61.0 in | Wt 181.7 lb

## 2012-11-27 DIAGNOSIS — C50419 Malignant neoplasm of upper-outer quadrant of unspecified female breast: Secondary | ICD-10-CM | POA: Diagnosis not present

## 2012-11-27 DIAGNOSIS — C50412 Malignant neoplasm of upper-outer quadrant of left female breast: Secondary | ICD-10-CM

## 2012-11-27 NOTE — Progress Notes (Signed)
OFFICE PROGRESS NOTE  CC Dr. [name] Dr. Claud Kelp Dr. Chipper Herb Dr. Huel Cote  DIAGNOSIS: 67 year old female with invasive lobular carcinoma of the left breast in the upper-outer quadrant stage IIA.  PRIOR THERAPY:  #1 patient originally presented to the multidisciplinary breast clinic in September 2013 with new diagnosis of left invasive lobular carcinoma by ultrasound measuring 1.6 and by MRI measuring 3 cm. She had undergone a needle core biopsy the tumor was lobular ER positive.  #2 she was seen by Dr. Dayton Scrape as well as Dr. Claud Kelp patient was interested in breast conservation. We recommended neoadjuvant antiestrogen therapy to try to reduce the size of the tumor for a good cosmetic result at this time of lumpectomy. She went on to receive letrozole 2.5 mg daily starting in 02/05/2012. Thus far she is tolerating it well.  CURRENT THERAPY: Neoadjuvant letrozole 2.5 mg daily since 02/05/2012  INTERVAL HISTORY: Elizabeth Hebert 67 y.o. female returns for followup visit. She had an MRI of the breasts performed. The MRI does show a reduction in the size of the left breast mass measured previously 1.9 x 1.0 x 1.0 cm. It now measures 2.1 x 1.8 x 1.5 cm. This is concerning. She has been seen by Dr. Claud Kelp. He is planning on doing a lumpectomy in the future hopefully in July. However he did send the patient for cardiac and medical clearance. She was seen by cardiology by Dr. Rennis Golden and he did give her cardiac clearance. Patient is going to be calling Dr. Jacinto Halim office to get an appointment set up for the surgical date. Otherwise patient seems to be doing well no other problems. She is denying any headaches double vision blurring of vision fevers chills or night sweats. Remainder of the review of systems is negative. MEDICAL HISTORY: Past Medical History  Diagnosis Date  . Myocardial infarction   . Colitis   . Diverticulitis   . Fibroid tumor   .  Anemia   . Broken arm 2011  . Hx of hernia repair   . History of cataract surgery 2012    ALLERGIES:  is allergic to vasotec.  MEDICATIONS:  Current Outpatient Prescriptions  Medication Sig Dispense Refill  . amLODipine-olmesartan (AZOR) 5-40 MG per tablet Take 1 tablet by mouth daily.      . Ascorbic Acid (VITAMIN C) 100 MG tablet Take 100 mg by mouth daily.      Marland Kitchen aspirin 81 MG tablet Take 81 mg by mouth daily.      . calcium-vitamin D (OSCAL WITH D) 250-125 MG-UNIT per tablet Take 1 tablet by mouth daily.      . cimetidine (TAGAMET) 200 MG tablet Take 200 mg by mouth 2 (two) times daily.       . Cranberry 500 MG CAPS Take 1 capsule by mouth daily.      . digoxin (LANOXIN) 0.125 MG tablet Take 0.125 mg by mouth daily.      . Exenatide ER (BYDUREON) 2 MG SUSR Inject into the skin once a week.      . ezetimibe (ZETIA) 10 MG tablet Take 10 mg by mouth daily.      . ferrous sulfate 325 (65 FE) MG tablet Take 325 mg by mouth daily with breakfast.      . glimepiride (AMARYL) 4 MG tablet Take 4 mg by mouth daily before breakfast.      . letrozole (FEMARA) 2.5 MG tablet       . metFORMIN (GLUCOPHAGE) 1000  MG tablet Take 1,000 mg by mouth 2 (two) times daily with a meal.      . metoprolol succinate (TOPROL-XL) 50 MG 24 hr tablet Take 50 mg by mouth daily. Take with or immediately following a meal.      . Multiple Vitamin (MULTIVITAMIN) capsule Take 1 capsule by mouth daily.      . rosuvastatin (CRESTOR) 20 MG tablet Take 20 mg by mouth daily.      . sertraline (ZOLOFT) 50 MG tablet Take 50 mg by mouth daily.      . sitaGLIPtin (JANUVIA) 100 MG tablet Take 100 mg by mouth daily.      . colesevelam (WELCHOL) 625 MG tablet Take 1,875 mg by mouth 2 (two) times daily with a meal.      . nitroGLYCERIN (NITROSTAT) 0.4 MG SL tablet Place 0.4 mg under the tongue every 5 (five) minutes as needed.       No current facility-administered medications for this visit.    SURGICAL HISTORY:  Past Surgical  History  Procedure Laterality Date  . Cholecystectomy    . Colon resection    . Total abdominal hysterectomy  2001    REVIEW OF SYSTEMS:  Pertinent items are noted in HPI.   HEALTH MAINTENANCE:  PHYSICAL EXAMINATION: Blood pressure 148/80, pulse 76, temperature 98.3 F (36.8 C), temperature source Oral, resp. rate 20, height 5\' 1"  (1.549 m), weight 181 lb 11.2 oz (82.419 kg). Body mass index is 34.35 kg/(m^2). ECOG PERFORMANCE STATUS: 0 - Asymptomatic   General appearance: alert, cooperative and appears stated age Neck: no adenopathy, no carotid bruit, no JVD, supple, symmetrical, trachea midline and thyroid not enlarged, symmetric, no tenderness/mass/nodules Lymph nodes: Cervical, supraclavicular, and axillary nodes normal. Resp: clear to auscultation bilaterally Back: symmetric, no curvature. ROM normal. No CVA tenderness. Cardio: regular rate and rhythm GI: soft, non-tender; bowel sounds normal; no masses,  no organomegaly Extremities: extremities normal, atraumatic, no cyanosis or edema Neurologic: Grossly normal Left breast examination no skin nodularity I am barely able to palpate the mass there is no nipple discharge or retraction or inversion. Right breast no masses or nipple discharge.  LABORATORY DATA: Lab Results  Component Value Date   WBC 9.4 10/20/2012   HGB 13.4 10/20/2012   HCT 41.9 10/20/2012   MCV 83.0 10/20/2012   PLT 206 10/20/2012      Chemistry      Component Value Date/Time   NA 141 10/20/2012 0816   K 4.1 10/20/2012 0816   CL 102 10/20/2012 0816   CO2 28 10/20/2012 0816   BUN 4.9* 10/20/2012 0816   CREATININE 0.8 10/20/2012 0816      Component Value Date/Time   CALCIUM 9.8 10/20/2012 0816   ALKPHOS 65 10/20/2012 0816   AST 39* 10/20/2012 0816   ALT 43 10/20/2012 0816   BILITOT 0.54 10/20/2012 0816      RADIOGRAPHIC STUDIES:  BILATERAL BREAST MRI WITH AND WITHOUT CONTRAST  Technique: Multiplanar, multisequence MR images of both breasts  were obtained  prior to and following the intravenous administration  of 17ml of Multihance. Three dimensional images were evaluated at  the independent DynaCad workstation.  Comparison: 01/31/2012 breast MRI.  Findings: There is minimal bilateral parenchymal background  enhancement. The previously seen 3.0 x 1.9 x 1.4 cm irregular mass-  like enhancement located within the left upper-outer quadrant has  markedly decreased in enhancement and has decreased in size now  measuring 1.9 x 1.0 x 1.0 cm in size. There are  no other areas of  worrisome enhancement within either breast. The previously  discussed left axillary lymph nodes appear stable. There is no  evidence for axillary adenopathy or internal mammary adenopathy.  There are no additional findings.  IMPRESSION:  Interval decrease in size and enhancement associated with the left  breast mass located within the upper-outer quadrant now measuring  1.9 x 1.0 x 1.0 cm in size. No additional findings.  RECOMMENDATION:  Treatment plan.   ASSESSMENT: 67 year old female with  #1 stage IIa invasive lobular carcinoma measuring 3.0 cm of the left breast status post needle core biopsy performed in August 2013. Patient is now on neoadjuvant antiestrogen therapy with letrozole 2.5 mg daily. She seems to be tolerating it very well.  #2 . MRI of the breast does reveal some enlargement of the disease. Her MRI performed in February had shown the disease to be 1.9 cm. Now in June MRI it is 2.1 cm.  #3 patient needs to proceed with surgery. She has met with Dr. Derrell Lolling. He is planning on taking her to the OR hopefully in July. Medical and cardiac clearance has been asked 4.  PLAN:  #1 patient will continue the letrozole 2.5 mg daily.  #2 proceed with breast surgery.  #3 I will see the patient back in August after the surgery. If patient does still have a considerable amount of residual disease I will ask for an Oncotype DX to determine her breast cancer recurrence  score to decide future course of therapy   All questions were answered. The patient knows to call the clinic with any problems, questions or concerns. We can certainly see the patient much sooner if necessary.  I spent 25 minutes counseling the patient face to face. The total time spent in the appointment was 30 minutes.    Drue Second, MD Medical/Oncology [facility] [phone] (beeper) [phone] (Office)  11/27/2012, 1:45 PM

## 2012-11-27 NOTE — Patient Instructions (Addendum)
Proceed with lumpectomy by Dr. Derrell Lolling  I will see you back in August

## 2012-11-27 NOTE — Telephone Encounter (Signed)
I called and notified the pt we got her cardiac clearance.  I reminded her to stop her Aspirin 5 days prior to surgery.  I will have our schedulers call her with a date.

## 2012-12-01 ENCOUNTER — Encounter (HOSPITAL_COMMUNITY): Payer: Self-pay | Admitting: Pharmacy Technician

## 2012-12-03 NOTE — Pre-Procedure Instructions (Addendum)
Elizabeth Hebert  12/03/2012   Your procedure is scheduled on: Tuesday, July 8th.  Report to [facility]  after appointment at [facility].              Take [place] Elevators to 3rd Floor- Short Stay.  Call this number if you have problems the morning of surgery: [phone]   Remember:   Do not eat food or drink liquids after midnight.   Take these medicines the morning of surgery with A SIP OF WATER: cimetidine (TAGAMET), digoxin (LANOXIN) ,metoprolol succinate (TOPROL-XL),sertraline (ZOLOFT).  Take if needed:nitroGLYCERIN (NITROSTAT).  Stop taking Aspirin, Coumadin, Plavix, Effient and Herbal medications.  Do not take any NSAIDs ie: Ibuprofen,  Advil,Naproxen or any medication containing Aspirin.               Do not wear jewelry, make-up or nail polish.  Do not wear lotions, powders, or perfumes. You may wear deodorant.  Do not shave 48 hours prior to surgery. Men may shave face and neck.  Do not bring valuables to the hospital.  [facility] is not responsible for any belongings or valuables.  Contacts, dentures or bridgework may not be worn into surgery.  Leave suitcase in the car. After surgery it may be brought to your room.  For patients admitted to the hospital, checkout time is 11:00 AM the day of discharge.   Patients discharged the day of surgery will not be allowed to drive home.  Name and phone number of your driver: -              Special Instructions: Shower with CHG wash (Bactoshield) tonight and again in the am prior to arriving to hospital.   Please read over the following fact sheets that you were given: Pain Booklet, Coughing and Deep Breathing and Surgical Site Infection Prevention

## 2012-12-07 ENCOUNTER — Encounter (HOSPITAL_COMMUNITY): Payer: Self-pay

## 2012-12-07 ENCOUNTER — Encounter (HOSPITAL_COMMUNITY)
Admission: RE | Admit: 2012-12-07 | Discharge: 2012-12-07 | Disposition: A | Discharge status: Home / Self Care | Payer: Medicare Other | Source: Ambulatory Visit | Attending: General Surgery | Admitting: General Surgery

## 2012-12-07 DIAGNOSIS — Z01818 Encounter for other preprocedural examination: Secondary | ICD-10-CM | POA: Diagnosis not present

## 2012-12-07 DIAGNOSIS — K5730 Diverticulosis of large intestine without perforation or abscess without bleeding: Secondary | ICD-10-CM | POA: Diagnosis not present

## 2012-12-07 DIAGNOSIS — D649 Anemia, unspecified: Secondary | ICD-10-CM | POA: Diagnosis not present

## 2012-12-07 DIAGNOSIS — C50419 Malignant neoplasm of upper-outer quadrant of unspecified female breast: Secondary | ICD-10-CM | POA: Diagnosis not present

## 2012-12-07 DIAGNOSIS — E669 Obesity, unspecified: Secondary | ICD-10-CM | POA: Diagnosis not present

## 2012-12-07 DIAGNOSIS — Z170 Estrogen receptor positive status [ER+]: Secondary | ICD-10-CM | POA: Diagnosis not present

## 2012-12-07 DIAGNOSIS — Z79899 Other long term (current) drug therapy: Secondary | ICD-10-CM | POA: Diagnosis not present

## 2012-12-07 DIAGNOSIS — Z8571 Personal history of Hodgkin lymphoma: Secondary | ICD-10-CM | POA: Diagnosis not present

## 2012-12-07 DIAGNOSIS — I252 Old myocardial infarction: Secondary | ICD-10-CM | POA: Diagnosis not present

## 2012-12-07 DIAGNOSIS — Z803 Family history of malignant neoplasm of breast: Secondary | ICD-10-CM | POA: Diagnosis not present

## 2012-12-07 DIAGNOSIS — Z801 Family history of malignant neoplasm of trachea, bronchus and lung: Secondary | ICD-10-CM | POA: Diagnosis not present

## 2012-12-07 DIAGNOSIS — K5289 Other specified noninfective gastroenteritis and colitis: Secondary | ICD-10-CM | POA: Diagnosis not present

## 2012-12-07 DIAGNOSIS — Z7982 Long term (current) use of aspirin: Secondary | ICD-10-CM | POA: Diagnosis not present

## 2012-12-07 HISTORY — DX: Type 2 diabetes mellitus without complications: E11.9

## 2012-12-07 HISTORY — DX: Essential (primary) hypertension: I10

## 2012-12-07 HISTORY — DX: Major depressive disorder, single episode, unspecified: F32.9

## 2012-12-07 HISTORY — DX: Unspecified osteoarthritis, unspecified site: M19.90

## 2012-12-07 HISTORY — DX: Cardiac murmur, unspecified: R01.1

## 2012-12-07 HISTORY — DX: Malignant (primary) neoplasm, unspecified: C80.1

## 2012-12-07 HISTORY — DX: Depression, unspecified: F32.A

## 2012-12-07 HISTORY — DX: Gastro-esophageal reflux disease without esophagitis: K21.9

## 2012-12-07 HISTORY — DX: Personal history of other diseases of the digestive system: Z87.19

## 2012-12-07 HISTORY — DX: Angina pectoris, unspecified: I20.9

## 2012-12-07 LAB — CBC WITH DIFFERENTIAL/PLATELET
Hemoglobin: 13.3 g/dL (ref 12.0–15.0)
Lymphocytes Relative: 20 % (ref 12–46)
Lymphs Abs: 2.3 10*3/uL (ref 0.7–4.0)
Monocytes Relative: 8 % (ref 3–12)
Neutrophils Relative %: 67 % (ref 43–77)
Platelets: 303 10*3/uL (ref 150–400)
RBC: 4.89 MIL/uL (ref 3.87–5.11)
WBC: 11.2 10*3/uL — ABNORMAL HIGH (ref 4.0–10.5)

## 2012-12-07 LAB — COMPREHENSIVE METABOLIC PANEL
ALT: 30 U/L (ref 0–35)
Alkaline Phosphatase: 67 U/L (ref 39–117)
BUN: 8 mg/dL (ref 6–23)
CO2: 24 mEq/L (ref 19–32)
Chloride: 100 mEq/L (ref 96–112)
GFR calc Af Amer: >90 mL/min (ref >90)
GFR calc non Af Amer: 88 mL/min — ABNORMAL LOW (ref >90)
Glucose, Bld: 182 mg/dL — ABNORMAL HIGH (ref 70–99)
Potassium: 3.7 mEq/L (ref 3.5–5.1)
Sodium: 137 mEq/L (ref 135–145)
Total Bilirubin: 0.3 mg/dL (ref 0.3–1.2)

## 2012-12-07 LAB — URINALYSIS, ROUTINE W REFLEX MICROSCOPIC
Bilirubin Urine: NEGATIVE
Ketones, ur: NEGATIVE mg/dL
Nitrite: NEGATIVE
Protein, ur: NEGATIVE mg/dL
Urobilinogen, UA: 0.2 mg/dL (ref 0.0–1.0)
pH: 6 (ref 5.0–8.0)

## 2012-12-07 MED ORDER — CEFAZOLIN SODIUM-DEXTROSE 2-3 GM-% IV SOLR: 2.0000 g | INTRAVENOUS | Status: DC

## 2012-12-07 MED ORDER — CHLORHEXIDINE GLUCONATE 4 % EX LIQD: 1.0000 "application " | Freq: Once | CUTANEOUS | Status: DC

## 2012-12-07 NOTE — Progress Notes (Addendum)
notes,  dr hilty in epic, ekg

## 2012-12-07 NOTE — H&P (Signed)
Patient ID: Elizabeth Hebert, female   DOB: [birth year], 67 y.o.   MRN: [id number]    Chief Complaint   Patient presents with   .  Breast Cancer Long Term Follow Up        HPI Elizabeth Hebert is a 67 y.o. female.  She returns following neoadjuvant letrozole for planning of her definitive surgery for her left breast cancer, upper outer quadrant.    She was initially referred by [name] at the breast center of [facility] for evaluation and management of an invasive cancer in the left breast, upper outer quadrant.   The patient did not perceive a problem in the left breast. She had not had a mammogram in 5 years, and Dr. Huel Cote sent her for screening mammograms. They found a 1.3 cm mass in the left breast at the 2:00 position, 15 cm from the nipple. Ultrasound left axilla was normal. Image guided biopsy of this area showed invasive cancer, lobular phenotype, receptor positive, HER-2/neu negative.   Subsequent MRI showed a solitary mass in the left breast upper outer quadrant but it looked much larger, 3 cm. at least. Left axilla lymph node showed nonspecific changes.    She ws being seen in the [facility] by me, Dr. Drue Second, and Dr. Chipper Herb on 02/05/2012.Decision was made to treat her with neoadjuvant letrozole, in hopes of improving the margins for a lumpectomy. She has done well with this other than hot flashes. Clinically she has responded with reduction in size of the left breast mass by physical exam. Initial MRI suggested that this mass was about 3.5 cm.. This is now about 2.1 cm and there are no abnormal lymph nodes.   Past history is significant for coronary disease with 2 stents placed by DR. Al [phone] . She had an MI at that time. No subsequent coronary events. She's had open cholecystectomy and appendectomy 1970s. Colon resection for inflammatory bowel disease and hysterectomy and BSO at the same time.Followed by [name]. She has  non-insulin dependent diabetes. She's had no prior history of breast problems.    Family History reveals breast cancer in a paternal grandmother and a maternal grandmother. No family history of ovarian cancer. No family history of prostate cancer. Maternal aunt had pancreatic cancer         Past Medical History   Diagnosis  Date   .  Myocardial infarction     .  Colitis     .  Diverticulitis     .  Fibroid tumor     .  Anemia     .  Broken arm  2011   .  Hx of hernia repair     .  History of cataract surgery  2012         Past Surgical History   Procedure  Laterality  Date   .  Cholecystectomy       .  Colon resection       .  Total abdominal hysterectomy    2001         Family History   Problem  Relation  Age of Onset   .  Breast cancer  Maternal Grandmother  35   .  Cervical cancer  Paternal Grandmother  7   .  Pancreatic cancer  Maternal Aunt         diagnosed in her  60s   .  Cancer  Cousin         maternal cousin died of a cancer that started in his eye        Social History History   Substance Use Topics   .  Smoking status:  Former Smoker -- 1.00 packs/day for 30 years       Quit date:  06/03/1997   .  Smokeless tobacco:  Not on file   .  Alcohol Use:  0.0 oz/week       0-1 Glasses of wine per week         Allergies   Allergen  Reactions   .  Vasotec (Enalaprilat)  Rash         Current Outpatient Prescriptions   Medication  Sig  Dispense  Refill   .  amLODipine-olmesartan (AZOR) 5-40 MG per tablet  Take 1 tablet by mouth daily.         .  Ascorbic Acid (VITAMIN C) 100 MG tablet  Take 100 mg by mouth daily.         Marland Kitchen  aspirin 81 MG tablet  Take 81 mg by mouth daily.         .  calcium-vitamin D (OSCAL WITH D) 250-125 MG-UNIT per tablet  Take 1 tablet by mouth daily.         .  cimetidine (TAGAMET) 200 MG tablet  Take 200 mg by mouth 4 (four) times daily.         .  Cranberry 500 MG CAPS  Take 1 capsule by mouth daily.         .  digoxin  (LANOXIN) 0.125 MG tablet  Take 0.125 mg by mouth daily.         .  Exenatide ER (BYDUREON) 2 MG SUSR  Inject into the skin once a week.         .  ezetimibe (ZETIA) 10 MG tablet  Take 10 mg by mouth daily.         .  ferrous sulfate 325 (65 FE) MG tablet  Take 325 mg by mouth daily with breakfast.         .  glimepiride (AMARYL) 4 MG tablet  Take 4 mg by mouth daily before breakfast.         .  letrozole (FEMARA) 2.5 MG tablet           .  metFORMIN (GLUCOPHAGE) 1000 MG tablet  Take 1,000 mg by mouth 2 (two) times daily with a meal.         .  metoprolol succinate (TOPROL-XL) 50 MG 24 hr tablet  Take 50 mg by mouth daily. Take with or immediately following a meal.         .  Multiple Vitamin (MULTIVITAMIN) capsule  Take 1 capsule by mouth daily.         .  nitroGLYCERIN (NITROSTAT) 0.4 MG SL tablet  Place 0.4 mg under the tongue every 5 (five) minutes as needed.         .  rosuvastatin (CRESTOR) 20 MG tablet  Take 20 mg by mouth daily.         .  sertraline (ZOLOFT) 50 MG tablet  Take 50 mg by mouth daily.         .  sitaGLIPtin (JANUVIA) 100 MG tablet  Take 100 mg by mouth daily.             No current  facility-administered medications for this visit.        Review of Systems  Constitutional: Negative for fever, chills and unexpected weight change.  HENT: Negative for hearing loss, congestion, sore throat, trouble swallowing and voice change.   Eyes: Negative for visual disturbance.  Respiratory: Negative for cough and wheezing (Denies chest pain, shortness of breath, cough, exertional dyspnea. No cardiac symptoms.).   Cardiovascular: Negative for chest pain, palpitations and leg swelling.        No chest pain, shortness of breath, exertional symptoms. No cardiac symptoms.  Gastrointestinal: Negative for nausea, vomiting, abdominal pain, diarrhea, constipation, blood in stool, abdominal distention and anal bleeding.  Endocrine: Positive for heat intolerance.  Genitourinary: Negative  for hematuria, vaginal bleeding and difficulty urinating.  Musculoskeletal: Negative for arthralgias.  Skin: Negative for rash and wound.  Neurological: Negative for seizures, syncope and headaches.  Hematological: Negative for adenopathy. Does not bruise/bleed easily.  Psychiatric/Behavioral: Negative for confusion.      Blood pressure 138/82, pulse 84, temperature 98 F (36.7 C), temperature source Oral, resp. rate 18, height 5\' 1"  (1.549 m), weight 182 lb (82.555 kg).   Physical Exam  Constitutional: She is oriented to person, place, and time. She appears well-developed and well-nourished. No distress.  HENT:   Head: Normocephalic and atraumatic.   Nose: Nose normal.   Mouth/Throat: No oropharyngeal exudate.  Eyes: Conjunctivae and EOM are normal. Pupils are equal, round, and reactive to light. Left eye exhibits no discharge. No scleral icterus.  Neck: Neck supple. No JVD present. No tracheal deviation present. No thyromegaly present.  Cardiovascular: Normal rate, regular rhythm, normal heart sounds and intact distal pulses.    No murmur heard. Pulmonary/Chest: Effort normal and breath sounds normal. No respiratory distress. She has no wheezes. She has no rales. She exhibits no tenderness.  Focused exam of the left breast reveals that the previously palpable mass is much less distinct, possibly nonpalpable. No skin change. No adenopathy.  Abdominal: Soft. Bowel sounds are normal. She exhibits no distension and no mass. There is no tenderness. There is no rebound and no guarding.  Right paramedian scar. Midline scar.  Musculoskeletal: She exhibits no edema and no tenderness.  Lymphadenopathy:    She has no cervical adenopathy.  Neurological: She is alert and oriented to person, place, and time. She exhibits normal muscle tone. Coordination normal.  Skin: Skin is warm. No rash noted. She is not diaphoretic. No erythema. No pallor.  Psychiatric: She has a normal mood and affect. Her  behavior is normal. Judgment and thought content normal.      Data Reviewed Recent cancer Center notes. Recent MRI.   Assessment     Invasive mammary carcinoma, upper outer quadrant left breast, probable lobular phenotype, receptor positive, HER-2-negative, initial  clinical stage T2, N0.    She is strongly motivated for breast conservation surgery, and I think hat she is a good candidate at this point in time.Marland Kitchen    History myocardial infarction   History cardiac catheterization with 2 Stents in Place.   History of multiple abdominal operations   Non-insulin dependent diabetes mellitus   Obesity       Plan    Scheduled for left partial mastectomy with needle localization, left axillary sentinel node biopsy   We will ask Dr. Cam Hai for preoperative medical and cardiac clearance. She is no longer followed by a cardiologist   I discussed the indications, details, techniques, and numerous risk of the surgery with the patient and  her husband. She is aware of risk of bleeding, infection, cosmetic deformity, reoperation for positive margins are positive nodes, or swelling, or numbness, and other unforeseen problems. She understands these issues and all of her questions are answered. She agrees with this plan.          [name]. Derrell Lolling, M.D., [facility] General and Minimally invasive Surgery Breast and Colorectal Surgery Office:   [phone] Pager:   [phone]

## 2012-12-08 ENCOUNTER — Encounter (HOSPITAL_COMMUNITY)
Admission: RE | Disposition: A | Discharge status: Home / Self Care | Payer: Self-pay | Source: Ambulatory Visit | Attending: General Surgery

## 2012-12-08 ENCOUNTER — Ambulatory Visit (HOSPITAL_BASED_OUTPATIENT_CLINIC_OR_DEPARTMENT_OTHER)
Admission: RE | Admit: 2012-12-08 | Discharge: 2012-12-08 | Disposition: A | Discharge status: Home / Self Care | Payer: Medicare Other | Source: Ambulatory Visit | Attending: General Surgery | Admitting: General Surgery

## 2012-12-08 ENCOUNTER — Encounter (HOSPITAL_COMMUNITY): Payer: Self-pay | Admitting: Anesthesiology

## 2012-12-08 ENCOUNTER — Encounter (HOSPITAL_COMMUNITY)
Admission: RE | Admit: 2012-12-08 | Discharge: 2012-12-08 | Disposition: A | Discharge status: Home / Self Care | Payer: Medicare Other | Source: Ambulatory Visit | Attending: General Surgery | Admitting: General Surgery

## 2012-12-08 ENCOUNTER — Ambulatory Visit (HOSPITAL_COMMUNITY): Payer: Medicare Other | Admitting: Anesthesiology

## 2012-12-08 ENCOUNTER — Ambulatory Visit
Admission: RE | Admit: 2012-12-08 | Discharge: 2012-12-08 | Disposition: A | Discharge status: Home / Self Care | Payer: Medicare Other | Source: Ambulatory Visit | Attending: General Surgery | Admitting: General Surgery

## 2012-12-08 ENCOUNTER — Encounter (HOSPITAL_COMMUNITY): Payer: Self-pay | Admitting: *Deleted

## 2012-12-08 DIAGNOSIS — E119 Type 2 diabetes mellitus without complications: Secondary | ICD-10-CM | POA: Diagnosis not present

## 2012-12-08 DIAGNOSIS — D4860 Neoplasm of uncertain behavior of unspecified breast: Secondary | ICD-10-CM | POA: Diagnosis not present

## 2012-12-08 DIAGNOSIS — C50419 Malignant neoplasm of upper-outer quadrant of unspecified female breast: Secondary | ICD-10-CM | POA: Diagnosis present

## 2012-12-08 DIAGNOSIS — N63 Unspecified lump in unspecified breast: Secondary | ICD-10-CM | POA: Diagnosis not present

## 2012-12-08 DIAGNOSIS — I252 Old myocardial infarction: Secondary | ICD-10-CM | POA: Insufficient documentation

## 2012-12-08 DIAGNOSIS — C50412 Malignant neoplasm of upper-outer quadrant of left female breast: Secondary | ICD-10-CM

## 2012-12-08 DIAGNOSIS — Z170 Estrogen receptor positive status [ER+]: Secondary | ICD-10-CM | POA: Diagnosis not present

## 2012-12-08 DIAGNOSIS — K5730 Diverticulosis of large intestine without perforation or abscess without bleeding: Secondary | ICD-10-CM | POA: Insufficient documentation

## 2012-12-08 DIAGNOSIS — Z803 Family history of malignant neoplasm of breast: Secondary | ICD-10-CM | POA: Insufficient documentation

## 2012-12-08 DIAGNOSIS — C50919 Malignant neoplasm of unspecified site of unspecified female breast: Secondary | ICD-10-CM | POA: Diagnosis not present

## 2012-12-08 DIAGNOSIS — Z7982 Long term (current) use of aspirin: Secondary | ICD-10-CM | POA: Insufficient documentation

## 2012-12-08 DIAGNOSIS — K5289 Other specified noninfective gastroenteritis and colitis: Secondary | ICD-10-CM | POA: Insufficient documentation

## 2012-12-08 DIAGNOSIS — D0590 Unspecified type of carcinoma in situ of unspecified breast: Secondary | ICD-10-CM | POA: Diagnosis not present

## 2012-12-08 DIAGNOSIS — Z801 Family history of malignant neoplasm of trachea, bronchus and lung: Secondary | ICD-10-CM | POA: Insufficient documentation

## 2012-12-08 DIAGNOSIS — E669 Obesity, unspecified: Secondary | ICD-10-CM | POA: Diagnosis not present

## 2012-12-08 DIAGNOSIS — K219 Gastro-esophageal reflux disease without esophagitis: Secondary | ICD-10-CM | POA: Diagnosis not present

## 2012-12-08 DIAGNOSIS — D649 Anemia, unspecified: Secondary | ICD-10-CM | POA: Insufficient documentation

## 2012-12-08 DIAGNOSIS — Z923 Personal history of irradiation: Secondary | ICD-10-CM

## 2012-12-08 DIAGNOSIS — Z8571 Personal history of Hodgkin lymphoma: Secondary | ICD-10-CM | POA: Insufficient documentation

## 2012-12-08 DIAGNOSIS — Z79899 Other long term (current) drug therapy: Secondary | ICD-10-CM | POA: Insufficient documentation

## 2012-12-08 HISTORY — PX: BREAST LUMPECTOMY: SHX2

## 2012-12-08 HISTORY — DX: Malignant neoplasm of unspecified site of unspecified female breast: C50.919

## 2012-12-08 HISTORY — PX: PARTIAL MASTECTOMY WITH NEEDLE LOCALIZATION AND AXILLARY SENTINEL LYMPH NODE BX: SHX6009

## 2012-12-08 HISTORY — DX: Personal history of irradiation: Z92.3

## 2012-12-08 LAB — GLUCOSE, CAPILLARY: Glucose-Capillary: 108 mg/dL — ABNORMAL HIGH (ref 70–99)

## 2012-12-08 SURGERY — PARTIAL MASTECTOMY WITH NEEDLE LOCALIZATION AND AXILLARY SENTINEL LYMPH NODE BX
Anesthesia: General | Site: Breast | Laterality: Left | Wound class: Clean

## 2012-12-08 SURGERY — Surgical Case
Anesthesia: *Unknown

## 2012-12-08 SURGERY — PARTIAL MASTECTOMY WITH NEEDLE LOCALIZATION AND AXILLARY SENTINEL LYMPH NODE BX
Anesthesia: General | Laterality: Left

## 2012-12-08 MED ORDER — GLYCOPYRROLATE 0.2 MG/ML IJ SOLN
INTRAMUSCULAR | Status: DC | PRN
  Administered 2012-12-08: 0.2 mg via INTRAVENOUS

## 2012-12-08 MED ORDER — EPHEDRINE SULFATE 50 MG/ML IJ SOLN
INTRAMUSCULAR | Status: DC | PRN
  Administered 2012-12-08: 5 mg via INTRAVENOUS
  Administered 2012-12-08: 10 mg via INTRAVENOUS

## 2012-12-08 MED ORDER — ONDANSETRON HCL 4 MG/2ML IJ SOLN: 4.0000 mg | Freq: Once | INTRAMUSCULAR | Status: DC | PRN

## 2012-12-08 MED ORDER — LIDOCAINE HCL (CARDIAC) 20 MG/ML IV SOLN
INTRAVENOUS | Status: DC | PRN
  Administered 2012-12-08: 60 mg via INTRAVENOUS

## 2012-12-08 MED ORDER — ACETAMINOPHEN 10 MG/ML IV SOLN
INTRAVENOUS | Status: AC
  Administered 2012-12-08: 1000 mg via INTRAVENOUS
  Filled 2012-12-08: qty 100

## 2012-12-08 MED ORDER — ONDANSETRON HCL 4 MG/2ML IJ SOLN
INTRAMUSCULAR | Status: DC | PRN
  Administered 2012-12-08 (×2): 4 mg via INTRAVENOUS

## 2012-12-08 MED ORDER — TECHNETIUM TC 99M SULFUR COLLOID FILTERED
1.0000 | Freq: Once | INTRAVENOUS | Status: AC | PRN
  Administered 2012-12-08: 1 via INTRADERMAL

## 2012-12-08 MED ORDER — SODIUM CHLORIDE 0.9 % IV SOLN: INTRAVENOUS | Status: DC

## 2012-12-08 MED ORDER — CEFAZOLIN SODIUM-DEXTROSE 2-3 GM-% IV SOLR
INTRAVENOUS | Status: AC
  Administered 2012-12-08: 2 g via INTRAVENOUS
  Filled 2012-12-08: qty 50

## 2012-12-08 MED ORDER — KETOROLAC TROMETHAMINE 15 MG/ML IJ SOLN
15.0000 mg | Freq: Once | INTRAMUSCULAR | Status: AC
  Administered 2012-12-08: 15 mg via INTRAVENOUS

## 2012-12-08 MED ORDER — HYDROMORPHONE HCL PF 1 MG/ML IJ SOLN
0.2500 mg | INTRAMUSCULAR | Status: DC | PRN
  Administered 2012-12-08: 0.25 mg via INTRAVENOUS

## 2012-12-08 MED ORDER — BUPIVACAINE-EPINEPHRINE PF 0.25-1:200000 % IJ SOLN
INTRAMUSCULAR | Status: AC
  Filled 2012-12-08: qty 30

## 2012-12-08 MED ORDER — FENTANYL CITRATE 0.05 MG/ML IJ SOLN
INTRAMUSCULAR | Status: DC | PRN
  Administered 2012-12-08: 50 ug via INTRAVENOUS
  Administered 2012-12-08: 75 ug via INTRAVENOUS

## 2012-12-08 MED ORDER — BUPIVACAINE-EPINEPHRINE 0.25% -1:200000 IJ SOLN
INTRAMUSCULAR | Status: DC | PRN
  Administered 2012-12-08: 1 mL

## 2012-12-08 MED ORDER — MIDAZOLAM HCL 5 MG/5ML IJ SOLN
INTRAMUSCULAR | Status: DC | PRN
  Administered 2012-12-08 (×2): 1 mg via INTRAVENOUS

## 2012-12-08 MED ORDER — DEXAMETHASONE SODIUM PHOSPHATE 10 MG/ML IJ SOLN
INTRAMUSCULAR | Status: DC | PRN
  Administered 2012-12-08: 4 mg via INTRAVENOUS

## 2012-12-08 MED ORDER — 0.9 % SODIUM CHLORIDE (POUR BTL) OPTIME
TOPICAL | Status: DC | PRN
  Administered 2012-12-08: 1000 mL

## 2012-12-08 MED ORDER — METHYLENE BLUE 1 % INJ SOLN
INTRAMUSCULAR | Status: AC
  Filled 2012-12-08: qty 10

## 2012-12-08 MED ORDER — SODIUM CHLORIDE 0.9 % IJ SOLN
INTRAMUSCULAR | Status: DC | PRN
  Administered 2012-12-08: 12:00:00 via INTRAMUSCULAR

## 2012-12-08 MED ORDER — PROPOFOL 10 MG/ML IV BOLUS
INTRAVENOUS | Status: DC | PRN
  Administered 2012-12-08: 140 mg via INTRAVENOUS

## 2012-12-08 MED ORDER — LACTATED RINGERS IV SOLN
INTRAVENOUS | Status: DC
  Administered 2012-12-08: 11:00:00 via INTRAVENOUS

## 2012-12-08 MED ORDER — HYDROMORPHONE HCL PF 1 MG/ML IJ SOLN
INTRAMUSCULAR | Status: AC
  Filled 2012-12-08: qty 1

## 2012-12-08 MED ORDER — LACTATED RINGERS IV SOLN
INTRAVENOUS | Status: DC | PRN
  Administered 2012-12-08 (×2): via INTRAVENOUS

## 2012-12-08 MED ORDER — HYDROCODONE-ACETAMINOPHEN 5-325 MG PO TABS: 1.0000 | ORAL_TABLET | ORAL | Status: DC | PRN

## 2012-12-08 MED ORDER — KETOROLAC TROMETHAMINE 30 MG/ML IJ SOLN
INTRAMUSCULAR | Status: AC
  Filled 2012-12-08: qty 1

## 2012-12-08 MED ORDER — ACETAMINOPHEN 10 MG/ML IV SOLN: 1000.0000 mg | Freq: Once | INTRAVENOUS | Status: AC | PRN

## 2012-12-08 SURGICAL SUPPLY — 58 items
ADH SKN CLS APL DERMABOND .7 (GAUZE/BANDAGES/DRESSINGS) ×1
APL SKNCLS STERI-STRIP NONHPOA (GAUZE/BANDAGES/DRESSINGS)
APPLIER CLIP 9.375 MED OPEN (MISCELLANEOUS) ×2
APR CLP MED 9.3 20 MLT OPN (MISCELLANEOUS) ×1
BENZOIN TINCTURE PRP APPL 2/3 (GAUZE/BANDAGES/DRESSINGS) ×1 IMPLANT
BINDER BREAST LRG (GAUZE/BANDAGES/DRESSINGS) IMPLANT
BINDER BREAST XLRG (GAUZE/BANDAGES/DRESSINGS) ×1 IMPLANT
BLADE SURG ROTATE 9660 (MISCELLANEOUS) IMPLANT
CANISTER SUCTION 2500CC (MISCELLANEOUS) ×2 IMPLANT
CHLORAPREP W/TINT 26ML (MISCELLANEOUS) ×2 IMPLANT
CLIP APPLIE 9.375 MED OPEN (MISCELLANEOUS) ×1 IMPLANT
CLOTH BEACON ORANGE TIMEOUT ST (SAFETY) ×2 IMPLANT
CONT SPEC 4OZ CLIKSEAL STRL BL (MISCELLANEOUS) ×5 IMPLANT
COVER PROBE W GEL 5X96 (DRAPES) ×2 IMPLANT
COVER SURGICAL LIGHT HANDLE (MISCELLANEOUS) ×2 IMPLANT
DECANTER SPIKE VIAL GLASS SM (MISCELLANEOUS) ×1 IMPLANT
DERMABOND ADVANCED (GAUZE/BANDAGES/DRESSINGS) ×1
DERMABOND ADVANCED .7 DNX12 (GAUZE/BANDAGES/DRESSINGS) ×1 IMPLANT
DEVICE DUBIN SPECIMEN MAMMOGRA (MISCELLANEOUS) ×2 IMPLANT
DRAIN CHANNEL 19F RND (DRAIN) IMPLANT
DRAPE LAPAROSCOPIC ABDOMINAL (DRAPES) ×2 IMPLANT
DRAPE PROXIMA HALF (DRAPES) ×1 IMPLANT
DRAPE UTILITY 15X26 W/TAPE STR (DRAPE) ×4 IMPLANT
DRSG PAD ABDOMINAL 8X10 ST (GAUZE/BANDAGES/DRESSINGS) ×2 IMPLANT
ELECT CAUTERY BLADE 6.4 (BLADE) ×2 IMPLANT
ELECT REM PT RETURN 9FT ADLT (ELECTROSURGICAL) ×2
ELECTRODE REM PT RTRN 9FT ADLT (ELECTROSURGICAL) ×1 IMPLANT
EVACUATOR SILICONE 100CC (DRAIN) IMPLANT
GLOVE BIO SURGEON STRL SZ7.5 (GLOVE) ×2 IMPLANT
GLOVE BIOGEL PI IND STRL 7.5 (GLOVE) IMPLANT
GLOVE BIOGEL PI INDICATOR 7.5 (GLOVE) ×2
GLOVE EUDERMIC 7 POWDERFREE (GLOVE) ×3 IMPLANT
GOWN STRL NON-REIN LRG LVL3 (GOWN DISPOSABLE) ×3 IMPLANT
GOWN STRL REIN XL XLG (GOWN DISPOSABLE) ×2 IMPLANT
KIT BASIN OR (CUSTOM PROCEDURE TRAY) ×2 IMPLANT
KIT MARKER MARGIN INK (KITS) ×2 IMPLANT
KIT ROOM TURNOVER OR (KITS) ×2 IMPLANT
NDL 18GX1X1/2 (RX/OR ONLY) (NEEDLE) ×1 IMPLANT
NDL HYPO 25GX1X1/2 BEV (NEEDLE) ×2 IMPLANT
NEEDLE 18GX1X1/2 (RX/OR ONLY) (NEEDLE) ×2 IMPLANT
NEEDLE HYPO 25GX1X1/2 BEV (NEEDLE) ×4 IMPLANT
NS IRRIG 1000ML POUR BTL (IV SOLUTION) ×2 IMPLANT
PACK GENERAL/GYN (CUSTOM PROCEDURE TRAY) ×2 IMPLANT
PAD ARMBOARD 7.5X6 YLW CONV (MISCELLANEOUS) ×2 IMPLANT
SPONGE GAUZE 4X4 12PLY (GAUZE/BANDAGES/DRESSINGS) ×1 IMPLANT
SPONGE LAP 4X18 X RAY DECT (DISPOSABLE) ×1 IMPLANT
STAPLER VISISTAT 35W (STAPLE) IMPLANT
STRIP CLOSURE SKIN 1/2X4 (GAUZE/BANDAGES/DRESSINGS) ×1 IMPLANT
SUT ETHILON 3 0 FSL (SUTURE) IMPLANT
SUT MNCRL AB 4-0 PS2 18 (SUTURE) ×3 IMPLANT
SUT SILK 2 0 SH (SUTURE) ×2 IMPLANT
SUT VIC AB 3-0 SH 18 (SUTURE) ×2 IMPLANT
SUT VIC AB 3-0 SH 27 (SUTURE) ×4
SUT VIC AB 3-0 SH 27XBRD (SUTURE) ×1 IMPLANT
SUT VICRYL AB 3 0 TIES (SUTURE) IMPLANT
SYR CONTROL 10ML LL (SYRINGE) ×4 IMPLANT
TOWEL OR 17X24 6PK STRL BLUE (TOWEL DISPOSABLE) ×2 IMPLANT
TOWEL OR 17X26 10 PK STRL BLUE (TOWEL DISPOSABLE) ×1 IMPLANT

## 2012-12-08 NOTE — Op Note (Signed)
Patient Name:           Elizabeth Hebert   Date of Surgery:        12/08/2012  Pre op Diagnosis:      Invasive mammary carcinoma, upper outer quadrant left breast, probable lobular phenotype, receptor positive, HER-2 negative,  initial clinical stage T2 N0. Status post neoadjuvant antiestrogen therapy  Post op Diagnosis:    Same  Procedure:                 Inject blue dye left breast, a left partial mastectomy with needle localization, left axillary sentinel node biopsy.  Surgeon:                     [name]. Derrell Lolling, M.D., FACS  Assistant:                      Magnus Ivan, RNFA  Operative Indications:   Elizabeth Hebert is a 67 y.o. female. She Is brought to the operating room electively for left partial mastectomy and sentinel lymph biopsy following 9 months of neoadjuvant letrozole.  She was initially referred by [name] at the breast center of [facility] for evaluation and management of an invasive cancer in the left breast, upper outer quadrant.  The patient did not perceive a problem in the left breast. She had not had a mammogram in 5 years, and Dr. Huel Cote sent her for screening mammograms. They found a 1.3 cm mass in the left breast at the 2:00 position, 15 cm from the nipple. Ultrasound of the  left axilla was normal. Image guided biopsy of this area showed invasive cancer, lobular phenotype, receptor positive, HER-2/neu negative.  Subsequent MRI showed a solitary mass in the left breast upper outer quadrant but it looked much larger, 3 cm. at least. Left axilla lymph node showed nonspecific changes.  She was seen in the [facility] by me, Dr. Drue Second, and Dr. Chipper Herb on 02/05/2012.Decision was made to treat her with neoadjuvant letrozole, in hopes of improving the margins for a lumpectomy. She has done well with this other than hot flashes. Clinically she has responded with reduction in size of the left breast mass by physical exam. Initial MRI suggested that  this mass was about 3.5 cm.. This is now about 2.1 cm and there are no abnormal lymph nodes.  Past history is significant for coronary disease with 2 stents placed by DR. Al [phone] . She had an MI at that time. No subsequent coronary events. She's had open cholecystectomy and appendectomy 1970s. Colon resection for inflammatory bowel disease and hysterectomy and BSO at the same time.Followed by [name]. She has non-insulin dependent diabetes. She's had no prior history of breast problems.  Family History reveals breast cancer in a paternal grandmother and a maternal grandmother. No family history of ovarian cancer. No family history of prostate cancer. Maternal aunt had pancreatic cancer   Operative Findings:       The localizing wire was placed from a far lateral position at the 3:00 position of the left breast. The wire was directed medially and went through the marker clip and beyond about 2 cm. The specimen mammogram showed the wire and the marker clip in the center of the specimen and it was felt that we had adequately excised this area from the radiographic and gross surgical standpoint. I found four sentinel lymph nodes in the axilla and all 4 were strongly radioactive and  3 had strong blue dye.  Procedure in Detail:          The patient underwent wire localization of the left breast cancer at the breast center of [place], and the wire was well placed. The patient came to the holding area and underwent injection of radionuclide into the left breast by the nuclear medicine technician. The patient was taken to the operating room and underwent general anesthesia with an LMA device. The surgical time out was performed. Following alcohol prep I injected 5 cc of blue dye into the left breast, subareolar area. This was 2 cc of methylene blue mixed with 3 cc of saline. The breast was massaged.  The left breast and axilla and chest wall were then prepped and draped in a sterile fashion.  Intravenous antibiotics had been given. 0.5% Marcaine with epinephrine was used as a local infiltration anesthetic. A transverse, radially oriented incision was made in the left breast laterally at the 3:00 position through the insertion site of the wire. Dissection was carried down into the breast tissue around the localizing wire. This took the dissection all the way down to the lateral border of the pectoralis major and minor muscles which was consistent with the MRI findings. The specimen was removed and marked with silk sutures and a 6-color ink kit. Marland Kitchen Specimen mammogram looked good as above. Hemostasis was excellent and achieved with  electrocautery. I inspected the wound and found that I was basically almost at the level I lymph node area so I was able to do the lymph node biopsy through the same incision. Using the neoprobe I found 4 sentinel lymph nodes as described above. These were sent separately to the lab. Hemostasis was excellent. The wound was irrigated with saline. I closed the breast tissue in 3 layers, 2 layers of interrupted 3 Vicryl and the skin was closed with a running subcuticular suture of 4-0 Monocryl and Dermabond. A breast binder was placed. The patient was taken to the recovery room in stable condition. EBL 15 cc. Counts correct. Complications none.     [name]. Derrell Lolling, M.D., FACS General and Minimally Invasive Surgery Breast and Colorectal Surgery  12/08/2012 12:45 PM

## 2012-12-08 NOTE — Transfer of Care (Signed)
Immediate Anesthesia Transfer of Care Note  Patient: Elizabeth Hebert  Procedure(s) Performed: Procedure(s): LEFT PARTIAL MASTECTOMY WITH NEEDLE LOCALIZATION AND AXILLARY SENTINEL LYMPH NODE BIOPSIES TIMES FOUR (Left)  Patient Location: PACU  Anesthesia Type:General  Level of Consciousness: awake, alert  and oriented  Airway & Oxygen Therapy: Patient Spontanous Breathing and Patient connected to nasal cannula oxygen  Post-op Assessment: Report given to PACU RN and Post -op Vital signs reviewed and stable  Post vital signs: Reviewed and stable  Complications: No apparent anesthesia complications

## 2012-12-08 NOTE — Preoperative (Signed)
Beta Blockers   Reason not to administer Beta Blockers:Not Applicable 

## 2012-12-08 NOTE — Anesthesia Postprocedure Evaluation (Signed)
  Anesthesia Post-op Note  Patient: Elizabeth Hebert  Procedure(s) Performed: Procedure(s): LEFT PARTIAL MASTECTOMY WITH NEEDLE LOCALIZATION AND AXILLARY SENTINEL LYMPH NODE BIOPSIES TIMES FOUR (Left)  Patient Location: PACU  Anesthesia Type:General  Level of Consciousness: awake, alert  and oriented  Airway and Oxygen Therapy: Patient Spontanous Breathing and Patient connected to nasal cannula oxygen  Post-op Pain: mild  Post-op Assessment: Post-op Vital signs reviewed, Patient's Cardiovascular Status Stable, Respiratory Function Stable, Patent Airway and Pain level controlled  Post-op Vital Signs: stable  Complications: No apparent anesthesia complications

## 2012-12-08 NOTE — Interval H&P Note (Signed)
History and Physical Interval Note:  12/08/2012 10:46 AM  Elizabeth Hebert  has presented today for surgery, with the diagnosis of left breast cancer  The goals and the various methods of treatment have been discussed with the patient and family. After consideration of risks, benefits and other options for treatment, the patient has consented to  Procedure(s): PARTIAL MASTECTOMY WITH NEEDLE LOCALIZATION AND AXILLARY SENTINEL LYMPH NODE BX (Left) as a surgical intervention .  The patient's history has been reviewed, patient examined today, no change in status, stable for surgery.  I have reviewed the patient's chart and labs.  Questions were answered to the patient's satisfaction.     Ernestene Mention

## 2012-12-08 NOTE — Anesthesia Preprocedure Evaluation (Addendum)
Anesthesia Evaluation  Patient identified by MRN, date of birth, ID band Patient awake    Reviewed: Allergy & Precautions, H&P , NPO status , Patient's Chart, lab work & pertinent test results, reviewed documented beta blocker date and time   Airway Mallampati: II TM Distance: <3 FB Neck ROM: Full    Dental  (+) Teeth Intact and Caps Upper front caps:   Pulmonary  breath sounds clear to auscultation        Cardiovascular hypertension, Pt. on home beta blockers Angina: patient has had no recent angina. + CAD, + Past MI and + Cardiac Stents (1999) Rhythm:Regular Rate:Normal     Neuro/Psych Depression    GI/Hepatic hiatal hernia, GERD-  Medicated and Controlled,  Endo/Other  diabetes, Type 2, Oral Hypoglycemic Agents  Renal/GU      Musculoskeletal   Abdominal   Peds  Hematology   Anesthesia Other Findings   Reproductive/Obstetrics                         Anesthesia Physical Anesthesia Plan  ASA: III  Anesthesia Plan: General   Post-op Pain Management:    Induction: Intravenous  Airway Management Planned: Oral ETT  Additional Equipment:   Intra-op Plan:   Post-operative Plan:   Informed Consent: I have reviewed the patients History and Physical, chart, labs and discussed the procedure including the risks, benefits and alternatives for the proposed anesthesia with the patient or authorized representative who has indicated his/her understanding and acceptance.   Dental advisory given  Plan Discussed with: CRNA  Anesthesia Plan Comments: (L. Breast Ca htn Type 2 DM 182 H/O CAD MI with stents 1999  Plan GA with oral ETT  Kipp Brood, MD)        Anesthesia Quick Evaluation

## 2012-12-10 ENCOUNTER — Encounter (HOSPITAL_COMMUNITY): Payer: Self-pay | Admitting: General Surgery

## 2012-12-14 ENCOUNTER — Telehealth (INDEPENDENT_AMBULATORY_CARE_PROVIDER_SITE_OTHER): Payer: Self-pay

## 2012-12-14 NOTE — Telephone Encounter (Signed)
I called and checked on the pt.  I gave her a follow up for next week to see Dr Derrell Lolling.  She asked about her pathology.  I read it to her ( Lobular carcinoma, 1.7 cm, clean but close margin, 4 negative lymphnodes)  I told her Dr Derrell Lolling would review it and he would discuss if she needs radiation or another excision of the margin.  I told her he will talk to her at her follow up and give her a copy.

## 2012-12-22 ENCOUNTER — Ambulatory Visit (INDEPENDENT_AMBULATORY_CARE_PROVIDER_SITE_OTHER): Payer: Medicare Other | Admitting: General Surgery

## 2012-12-22 ENCOUNTER — Encounter (INDEPENDENT_AMBULATORY_CARE_PROVIDER_SITE_OTHER): Payer: Self-pay | Admitting: General Surgery

## 2012-12-22 ENCOUNTER — Telehealth: Payer: Self-pay | Admitting: Oncology

## 2012-12-22 VITALS — BP 116/66 | HR 64 | Temp 97.3°F | Resp 15 | Ht 61.0 in | Wt 179.2 lb

## 2012-12-22 DIAGNOSIS — C50412 Malignant neoplasm of upper-outer quadrant of left female breast: Secondary | ICD-10-CM

## 2012-12-22 DIAGNOSIS — C50419 Malignant neoplasm of upper-outer quadrant of unspecified female breast: Secondary | ICD-10-CM

## 2012-12-22 NOTE — Progress Notes (Addendum)
Patient ID: Elizabeth Hebert, female   DOB: 03-07-1946, 67 y.o.   MRN: [id number]  History:   Elizabeth Hebert is a 67 y.o. female. She returns for her first postop visit following left partial mastectomy and sentinel lymph biopsy following 9 months of neoadjuvant letrozole.  At the time of surgery, the marker clip and localizing wire were in the center of the specimen and  it was felt that we had widely negative margins. Final pathology report showed invasive lobular carcinoma, grade 2, broadly present less than 1 mm to the anterior margin, zero out of four sentinel nodes positive, pathologic stage T1c., N0.     ER 78%, PR 0. HER-2 negative.  She was initially referred by Elizabeth Hebert at the breast center of [facility] for evaluation and management of an invasive cancer in the left breast, upper outer quadrant.  The patient did not perceive a problem in the left breast. She had not had a mammogram in 5 years, and Dr. Huel Cote sent her for screening mammograms. They found a 1.3 cm mass in the left breast at the 2:00 position, 15 cm from the nipple. Ultrasound of the left axilla was normal. Image guided biopsy of this area showed invasive cancer, lobular phenotype, receptor positive, HER-2/neu negative.  Subsequent MRI showed a solitary mass in the left breast upper outer quadrant but it looked much larger, 3 cm. at least. Left axilla lymph node showed nonspecific changes.  She was seen in the [facility] by me, Dr. Drue Second, and Dr. Chipper Herb on 02/05/2012.Decision was made to treat her with neoadjuvant letrozole, in hopes of improving the margins for a lumpectomy. She has done well with this other than hot flashes. Clinically she  responded with reduction in size of the left breast mass by physical exam. Initial MRI suggested that this mass was about 3.5 cm.. Following neoadjuvant letrozole it was about 2.1 cm and there are no abnormal lymph nodes.  Past history is significant for coronary  disease with 2 stents placed by DR. Al [phone] . She had an MI at that time. No subsequent coronary events. She's had open cholecystectomy and appendectomy 1970s. Colon resection for inflammatory bowel disease and hysterectomy and BSO at the same time.Followed by [name]. She has non-insulin dependent diabetes. She's had no prior history of breast problems.  Family History reveals breast cancer in a paternal grandmother and a maternal grandmother. No family history of ovarian cancer. No family history of prostate cancer. Maternal aunt had pancreatic cancer   Exam: Patient looks well. In no distress. Left breast is examined. A radially oriented incision left breast, far lateral area. Healing well. Small seroma. No infection.   Assessment: Invasive lobular carcinoma, left breast, upper outer quadrant, ER +, PR neg., Her-2 neg,pathologic stage  ypT1c, yp N0. Status post 10 months of neoadjuvant letrozole Invasive lobular carcinoma broadly less than 1 mm to the anterior margin.  Plan: I reviewed the patient's pathology report with her and her husband. I drew pictures to explain the anatomic issue of a close margin. I told her that, because  the close margin was broad rather than focal, that Dr. Dayton Scrape would probably opt for me to re excise this area. We will discuss her case in the breast conference in the near future. I scheduled an appointment for her to see Dr. Dayton Scrape, and an appointment to followup with me in a few weeks. I suspect it we will make our decision about re\re excisions and, and  we'll call her with the incision.   [name]. Derrell Lolling, M.D., [facility] General and Minimally invasive Surgery Breast and Colorectal Surgery Office:   [phone] Pager:   [phone]

## 2012-12-22 NOTE — Telephone Encounter (Signed)
Faxed pt medical records to  pharmquest

## 2012-12-22 NOTE — Patient Instructions (Signed)
You are recovering from your left partial mastectomy and sentinel node biopsy without any obvious surgical complications.  The anterior margin is very close, and I suspect that we will need to go ahead and re excise this area.  Before scheduling the surgery, I would like Dr. Teola Bradley opinion about this. We will schedule an appointment with him for you. If we decide ahead of time, I will call you and go ahead and schedule surgery.

## 2012-12-24 ENCOUNTER — Encounter (HOSPITAL_BASED_OUTPATIENT_CLINIC_OR_DEPARTMENT_OTHER): Payer: Self-pay | Admitting: *Deleted

## 2012-12-24 ENCOUNTER — Other Ambulatory Visit (INDEPENDENT_AMBULATORY_CARE_PROVIDER_SITE_OTHER): Payer: Self-pay | Admitting: General Surgery

## 2012-12-24 ENCOUNTER — Telehealth (INDEPENDENT_AMBULATORY_CARE_PROVIDER_SITE_OTHER): Payer: Self-pay | Admitting: General Surgery

## 2012-12-24 NOTE — Telephone Encounter (Signed)
I called the patient this morning to discuss her close anterior margin, and to discuss my consultation with Dr. Chipper Herb. We both advise reexcision of the anterior margin. She is in full agreement with this plan. I discussed the indications, details, techniques and numerous risk of the surgery with her. She is in full agreement. We will schedule in the near future.   Elizabeth Hebert. Derrell Lolling, M.D., [facility] General and Minimally invasive Surgery Breast and Colorectal Surgery Office:   [phone] Pager:   [phone]

## 2012-12-24 NOTE — H&P (Signed)
Elizabeth Hebert    MRN:  [id number]   Description: 67 year old female  Provider: Ernestene Mention, MD  Department: [facility]        Diagnoses    Cancer of upper-outer quadrant of female breast, left    -  Primary    174.4      Reason for Visit    Routine Post Op    1st p/o partial maste        Current Vitals    BP Pulse Temp(Src) Resp Ht Wt    116/66 64 97.3 F (36.3 C) (Temporal) 15 5\' 1"  (1.549 m) 179 lb 3.2 oz (81.285 kg)    BMI 33.88 kg/m2                  History and Physical    Ernestene Mention, MD    Status: Addendum                      History:   Elizabeth Hebert is a 67 y.o. female. She returns for her first postop visit following left partial mastectomy and sentinel lymph biopsy following 9 months of neoadjuvant letrozole.   At the time of surgery, the marker clip and localizing wire were in the center of the specimen and  it was felt that we had widely negative margins. Final pathology report showed invasive lobular carcinoma, grade 2, broadly present less than 1 mm to the anterior margin, zero out of four sentinel nodes positive, pathologic stage T1c., N0.     ER 78%, PR 0. HER-2 negative.   She was initially referred by [name] at the breast center of [facility] for evaluation and management of an invasive cancer in the left breast, upper outer quadrant.   Dr. Huel Cote sent her for screening mammograms. They found a 1.3 cm mass in the left breast at the 2:00 position, 15 cm from the nipple. Ultrasound of the left axilla was normal. Image guided biopsy of this area showed invasive cancer, lobular phenotype, receptor positive, HER-2/neu negative.   Subsequent MRI showed a solitary mass in the left breast upper outer quadrant but it looked much larger, 3 cm. at least. Left axilla lymph node showed nonspecific changes.   She was seen in the [facility] by me, Dr. Drue Second, and Dr. Chipper Herb on 02/05/2012.Decision was made to  treat her with neoadjuvant letrozole, in hopes of improving the margins for a lumpectomy. She has done well with this other than hot flashes. Clinically she  responded with reduction in size of the left breast mass by physical exam. Initial MRI suggested that this mass was about 3.5 cm.. Following neoadjuvant letrozole it was about 2.1 cm and there are no abnormal lymph nodes.   Past history is significant for coronary disease with 2 stents placed by DR. Al [phone] . She had an MI at that time. No subsequent coronary events. She's had open cholecystectomy and appendectomy 1970s. Colon resection for inflammatory bowel disease and hysterectomy and BSO at the same time.Followed by [name]. She has non-insulin dependent diabetes. She's had no prior history of breast problems.   Family History reveals breast cancer in a paternal grandmother and a maternal grandmother. No family history of ovarian cancer. No family history of prostate cancer. Maternal aunt had pancreatic cancer     Exam: Patient looks well. In no distress. Left breast is examined. A radially oriented incision left breast, far lateral  area. Healing well. Small seroma. No infection. Lungs: clear to auscultation bilaterally Heart:  RRR, no murmer, no ectopy.     Assessment: Invasive lobular carcinoma, left breast, upper outer quadrant, ER +, PR neg., Her-2 neg,pathologic stage  ypT1c, yp N0. Status post 10 months of neoadjuvant letrozole Invasive lobular carcinoma broadly less than 1 mm to the anterior margin.   Plan: I reviewed the patient's pathology report with her and her husband. I drew pictures to explain the anatomic issue of a close margin. I told her that, because  the close margin was broad rather than focal, that Dr. Dayton Scrape would probably opt for me to re excise this area. I discussed this with Dr. Dayton Scrape, and we agreed that the best thing would be to rexcise this area. We will discuss her case in the breast  conference in the near future. We will schedule reexcision.[name]. Derrell Lolling, M.D., [facility] General and Minimally invasive Surgery Breast and Colorectal Surgery Office:   [phone] Pager:   [phone]

## 2012-12-24 NOTE — Progress Notes (Signed)
For re-exc-had lump snbx 12/08/12 Had cardiac clearance well

## 2012-12-25 ENCOUNTER — Encounter (HOSPITAL_BASED_OUTPATIENT_CLINIC_OR_DEPARTMENT_OTHER): Payer: Self-pay | Admitting: *Deleted

## 2012-12-25 ENCOUNTER — Encounter (HOSPITAL_BASED_OUTPATIENT_CLINIC_OR_DEPARTMENT_OTHER)
Admission: RE | Disposition: A | Discharge status: Home / Self Care | Payer: Self-pay | Source: Ambulatory Visit | Attending: General Surgery

## 2012-12-25 ENCOUNTER — Ambulatory Visit (HOSPITAL_BASED_OUTPATIENT_CLINIC_OR_DEPARTMENT_OTHER): Payer: Medicare Other | Admitting: Certified Registered Nurse Anesthetist

## 2012-12-25 ENCOUNTER — Encounter (HOSPITAL_BASED_OUTPATIENT_CLINIC_OR_DEPARTMENT_OTHER): Payer: Self-pay | Admitting: Certified Registered Nurse Anesthetist

## 2012-12-25 ENCOUNTER — Ambulatory Visit (HOSPITAL_BASED_OUTPATIENT_CLINIC_OR_DEPARTMENT_OTHER)
Admission: RE | Admit: 2012-12-25 | Discharge: 2012-12-25 | Disposition: A | Discharge status: Home / Self Care | Payer: Medicare Other | Source: Ambulatory Visit | Attending: General Surgery | Admitting: General Surgery

## 2012-12-25 DIAGNOSIS — F329 Major depressive disorder, single episode, unspecified: Secondary | ICD-10-CM | POA: Insufficient documentation

## 2012-12-25 DIAGNOSIS — Z9861 Coronary angioplasty status: Secondary | ICD-10-CM | POA: Insufficient documentation

## 2012-12-25 DIAGNOSIS — Z170 Estrogen receptor positive status [ER+]: Secondary | ICD-10-CM | POA: Insufficient documentation

## 2012-12-25 DIAGNOSIS — K219 Gastro-esophageal reflux disease without esophagitis: Secondary | ICD-10-CM | POA: Diagnosis not present

## 2012-12-25 DIAGNOSIS — N641 Fat necrosis of breast: Secondary | ICD-10-CM | POA: Diagnosis not present

## 2012-12-25 DIAGNOSIS — Z800 Family history of malignant neoplasm of digestive organs: Secondary | ICD-10-CM | POA: Insufficient documentation

## 2012-12-25 DIAGNOSIS — Z9079 Acquired absence of other genital organ(s): Secondary | ICD-10-CM | POA: Diagnosis not present

## 2012-12-25 DIAGNOSIS — E119 Type 2 diabetes mellitus without complications: Secondary | ICD-10-CM | POA: Diagnosis not present

## 2012-12-25 DIAGNOSIS — Z79899 Other long term (current) drug therapy: Secondary | ICD-10-CM | POA: Insufficient documentation

## 2012-12-25 DIAGNOSIS — C50419 Malignant neoplasm of upper-outer quadrant of unspecified female breast: Secondary | ICD-10-CM | POA: Diagnosis not present

## 2012-12-25 DIAGNOSIS — Z803 Family history of malignant neoplasm of breast: Secondary | ICD-10-CM | POA: Diagnosis not present

## 2012-12-25 DIAGNOSIS — K589 Irritable bowel syndrome without diarrhea: Secondary | ICD-10-CM | POA: Insufficient documentation

## 2012-12-25 DIAGNOSIS — Z90710 Acquired absence of both cervix and uterus: Secondary | ICD-10-CM | POA: Diagnosis not present

## 2012-12-25 DIAGNOSIS — I2510 Atherosclerotic heart disease of native coronary artery without angina pectoris: Secondary | ICD-10-CM | POA: Insufficient documentation

## 2012-12-25 DIAGNOSIS — R011 Cardiac murmur, unspecified: Secondary | ICD-10-CM | POA: Insufficient documentation

## 2012-12-25 DIAGNOSIS — K449 Diaphragmatic hernia without obstruction or gangrene: Secondary | ICD-10-CM | POA: Diagnosis not present

## 2012-12-25 DIAGNOSIS — Z9221 Personal history of antineoplastic chemotherapy: Secondary | ICD-10-CM | POA: Insufficient documentation

## 2012-12-25 DIAGNOSIS — I252 Old myocardial infarction: Secondary | ICD-10-CM | POA: Insufficient documentation

## 2012-12-25 DIAGNOSIS — Z9049 Acquired absence of other specified parts of digestive tract: Secondary | ICD-10-CM | POA: Insufficient documentation

## 2012-12-25 DIAGNOSIS — F3289 Other specified depressive episodes: Secondary | ICD-10-CM | POA: Diagnosis not present

## 2012-12-25 DIAGNOSIS — C50919 Malignant neoplasm of unspecified site of unspecified female breast: Secondary | ICD-10-CM | POA: Diagnosis not present

## 2012-12-25 DIAGNOSIS — D4860 Neoplasm of uncertain behavior of unspecified breast: Secondary | ICD-10-CM | POA: Diagnosis not present

## 2012-12-25 DIAGNOSIS — C50412 Malignant neoplasm of upper-outer quadrant of left female breast: Secondary | ICD-10-CM

## 2012-12-25 HISTORY — PX: MASTECTOMY, PARTIAL: SHX709

## 2012-12-25 LAB — POCT HEMOGLOBIN-HEMACUE: Hemoglobin: 13.8 g/dL (ref 12.0–15.0)

## 2012-12-25 SURGERY — MASTECTOMY PARTIAL
Anesthesia: General | Site: Breast | Laterality: Left | Wound class: Clean

## 2012-12-25 MED ORDER — SODIUM CHLORIDE 0.9 % IJ SOLN: 3.0000 mL | Freq: Two times a day (BID) | INTRAMUSCULAR | Status: DC

## 2012-12-25 MED ORDER — MIDAZOLAM HCL 2 MG/2ML IJ SOLN: 1.0000 mg | INTRAMUSCULAR | Status: DC | PRN

## 2012-12-25 MED ORDER — EPHEDRINE SULFATE 50 MG/ML IJ SOLN
INTRAMUSCULAR | Status: DC | PRN
  Administered 2012-12-25 (×2): 10 mg via INTRAVENOUS

## 2012-12-25 MED ORDER — PROPOFOL 10 MG/ML IV BOLUS
INTRAVENOUS | Status: DC | PRN
  Administered 2012-12-25: 150 mg via INTRAVENOUS

## 2012-12-25 MED ORDER — FENTANYL CITRATE 0.05 MG/ML IJ SOLN
INTRAMUSCULAR | Status: DC | PRN
  Administered 2012-12-25 (×2): 25 ug via INTRAVENOUS

## 2012-12-25 MED ORDER — CEFAZOLIN SODIUM-DEXTROSE 2-3 GM-% IV SOLR
2.0000 g | INTRAVENOUS | Status: AC
  Administered 2012-12-25: 2 g via INTRAVENOUS

## 2012-12-25 MED ORDER — ONDANSETRON HCL 4 MG/2ML IJ SOLN
INTRAMUSCULAR | Status: DC | PRN
  Administered 2012-12-25: 4 mg via INTRAVENOUS

## 2012-12-25 MED ORDER — DEXAMETHASONE SODIUM PHOSPHATE 4 MG/ML IJ SOLN
INTRAMUSCULAR | Status: DC | PRN
  Administered 2012-12-25: 4 mg via INTRAVENOUS

## 2012-12-25 MED ORDER — ACETAMINOPHEN 325 MG PO TABS: 650.0000 mg | ORAL_TABLET | ORAL | Status: DC | PRN

## 2012-12-25 MED ORDER — LACTATED RINGERS IV SOLN
INTRAVENOUS | Status: DC
  Administered 2012-12-25 (×2): via INTRAVENOUS

## 2012-12-25 MED ORDER — HYDROCODONE-ACETAMINOPHEN 5-325 MG PO TABS: 1.0000 | ORAL_TABLET | ORAL | Status: DC | PRN

## 2012-12-25 MED ORDER — CHLORHEXIDINE GLUCONATE 4 % EX LIQD: 1.0000 "application " | Freq: Once | CUTANEOUS | Status: DC

## 2012-12-25 MED ORDER — SODIUM CHLORIDE 0.9 % IV SOLN: INTRAVENOUS | Status: DC

## 2012-12-25 MED ORDER — OXYCODONE HCL 5 MG PO TABS: 5.0000 mg | ORAL_TABLET | ORAL | Status: DC | PRN

## 2012-12-25 MED ORDER — MORPHINE SULFATE 2 MG/ML IJ SOLN: 2.0000 mg | INTRAMUSCULAR | Status: DC | PRN

## 2012-12-25 MED ORDER — ONDANSETRON HCL 4 MG/2ML IJ SOLN: 4.0000 mg | Freq: Four times a day (QID) | INTRAMUSCULAR | Status: DC | PRN

## 2012-12-25 MED ORDER — ACETAMINOPHEN 650 MG RE SUPP: 650.0000 mg | RECTAL | Status: DC | PRN

## 2012-12-25 MED ORDER — FENTANYL CITRATE 0.05 MG/ML IJ SOLN: 50.0000 ug | INTRAMUSCULAR | Status: DC | PRN

## 2012-12-25 MED ORDER — LIDOCAINE HCL (CARDIAC) 20 MG/ML IV SOLN
INTRAVENOUS | Status: DC | PRN
  Administered 2012-12-25: 60 mg via INTRAVENOUS

## 2012-12-25 MED ORDER — BUPIVACAINE-EPINEPHRINE 0.5% -1:200000 IJ SOLN
INTRAMUSCULAR | Status: DC | PRN
  Administered 2012-12-25: 15 mL

## 2012-12-25 MED ORDER — SODIUM CHLORIDE 0.9 % IJ SOLN: 3.0000 mL | INTRAMUSCULAR | Status: DC | PRN

## 2012-12-25 MED ORDER — SODIUM CHLORIDE 0.9 % IV SOLN: 250.0000 mL | INTRAVENOUS | Status: DC | PRN

## 2012-12-25 SURGICAL SUPPLY — 54 items
ADH SKN CLS APL DERMABOND .7 (GAUZE/BANDAGES/DRESSINGS)
APL SKNCLS STERI-STRIP NONHPOA (GAUZE/BANDAGES/DRESSINGS)
APPLIER CLIP 9.375 MED OPEN (MISCELLANEOUS)
APR CLP MED 9.3 20 MLT OPN (MISCELLANEOUS)
BENZOIN TINCTURE PRP APPL 2/3 (GAUZE/BANDAGES/DRESSINGS) IMPLANT
BLADE SURG 15 STRL LF DISP TIS (BLADE) ×2 IMPLANT
BLADE SURG 15 STRL SS (BLADE) ×4
CANISTER SUCTION 1200CC (MISCELLANEOUS) ×2 IMPLANT
CHLORAPREP W/TINT 26ML (MISCELLANEOUS) ×2 IMPLANT
CLIP APPLIE 9.375 MED OPEN (MISCELLANEOUS) IMPLANT
COVER MAYO STAND STRL (DRAPES) ×2 IMPLANT
COVER TABLE BACK 60X90 (DRAPES) ×2 IMPLANT
DECANTER SPIKE VIAL GLASS SM (MISCELLANEOUS) IMPLANT
DERMABOND ADVANCED (GAUZE/BANDAGES/DRESSINGS)
DERMABOND ADVANCED .7 DNX12 (GAUZE/BANDAGES/DRESSINGS) IMPLANT
DEVICE DUBIN W/COMP PLATE 8390 (MISCELLANEOUS) ×2 IMPLANT
DRAPE LAPAROSCOPIC ABDOMINAL (DRAPES) IMPLANT
DRAPE PED LAPAROTOMY (DRAPES) ×2 IMPLANT
DRAPE UTILITY XL STRL (DRAPES) ×2 IMPLANT
DRSG EMULSION OIL 3X3 NADH (GAUZE/BANDAGES/DRESSINGS) IMPLANT
ELECT REM PT RETURN 9FT ADLT (ELECTROSURGICAL) ×2
ELECTRODE REM PT RTRN 9FT ADLT (ELECTROSURGICAL) ×1 IMPLANT
GAUZE SPONGE 4X4 12PLY STRL LF (GAUZE/BANDAGES/DRESSINGS) IMPLANT
GAUZE SPONGE 4X4 16PLY XRAY LF (GAUZE/BANDAGES/DRESSINGS) IMPLANT
GLOVE EUDERMIC 7 POWDERFREE (GLOVE) ×2 IMPLANT
GOWN PREVENTION PLUS XLARGE (GOWN DISPOSABLE) ×2 IMPLANT
GOWN PREVENTION PLUS XXLARGE (GOWN DISPOSABLE) ×2 IMPLANT
KIT MARKER MARGIN INK (KITS) ×2 IMPLANT
NDL HYPO 25X1 1.5 SAFETY (NEEDLE) ×1 IMPLANT
NEEDLE HYPO 22GX1.5 SAFETY (NEEDLE) IMPLANT
NEEDLE HYPO 25X1 1.5 SAFETY (NEEDLE) ×2 IMPLANT
NS IRRIG 1000ML POUR BTL (IV SOLUTION) ×2 IMPLANT
PACK BASIN DAY SURGERY FS (CUSTOM PROCEDURE TRAY) ×2 IMPLANT
PENCIL BUTTON HOLSTER BLD 10FT (ELECTRODE) ×2 IMPLANT
SLEEVE SCD COMPRESS KNEE MED (MISCELLANEOUS) IMPLANT
STAPLER VISISTAT 35W (STAPLE) IMPLANT
STRIP CLOSURE SKIN 1/2X4 (GAUZE/BANDAGES/DRESSINGS) IMPLANT
SUT ETHILON 4 0 PS 2 18 (SUTURE) IMPLANT
SUT MNCRL AB 4-0 PS2 18 (SUTURE) IMPLANT
SUT SILK 2 0 SH (SUTURE) ×2 IMPLANT
SUT VIC AB 2-0 SH 27 (SUTURE)
SUT VIC AB 2-0 SH 27XBRD (SUTURE) IMPLANT
SUT VIC AB 3-0 FS2 27 (SUTURE) IMPLANT
SUT VIC AB 4-0 P-3 18XBRD (SUTURE) IMPLANT
SUT VIC AB 4-0 P3 18 (SUTURE)
SUT VICRYL 3-0 CR8 SH (SUTURE) IMPLANT
SUT VICRYL 4-0 PS2 18IN ABS (SUTURE) IMPLANT
SYR BULB 3OZ (MISCELLANEOUS) IMPLANT
SYR CONTROL 10ML LL (SYRINGE) ×2 IMPLANT
TAPE HYPAFIX 4 X10 (GAUZE/BANDAGES/DRESSINGS) IMPLANT
TOWEL OR NON WOVEN STRL DISP B (DISPOSABLE) ×2 IMPLANT
TRAY DSU PREP LF (CUSTOM PROCEDURE TRAY) ×2 IMPLANT
TUBE CONNECTING 20X1/4 (TUBING) ×2 IMPLANT
YANKAUER SUCT BULB TIP NO VENT (SUCTIONS) ×2 IMPLANT

## 2012-12-25 NOTE — Anesthesia Postprocedure Evaluation (Signed)
  Anesthesia Post-op Note  Patient: Elizabeth Hebert  Procedure(s) Performed: Procedure(s): MASTECTOMY PARTIAL with re-excision of margins (Left)  Patient Location: PACU  Anesthesia Type:General  Level of Consciousness: awake, alert  and oriented  Airway and Oxygen Therapy: Patient Spontanous Breathing  Post-op Pain: mild  Post-op Assessment: Post-op Vital signs reviewed  Post-op Vital Signs: Reviewed  Complications: No apparent anesthesia complications

## 2012-12-25 NOTE — Op Note (Signed)
Patient Name:           Elizabeth Hebert   Date of Surgery:        12/25/2012  Pre op Diagnosis:      Invasive lobular carcinoma, left breast, upper outer quadrant, ER +, PR neg., Her-2 neg,pathologic stage ypT1c, yp N0.  Status post 10 months of neoadjuvant letrozole  Invasive lobular carcinoma broadly less than 1 mm to the anterior margin.   Post op Diagnosis:    same  Procedure:                 Left partial mastectomy, reexcision of anterior margin  Surgeon:                     [name]. Derrell Lolling, M.D., FACS  Assistant:                      None  Operative Indications:   [name] is a 67 y.o. female. She recently underwent left partial mastectomy and sentinel lymph biopsy following 9 months of neoadjuvant letrozole.  At the time of surgery, the marker clip and localizing wire were in the center of the specimen and it was felt that we had widely negative margins. Final pathology report showed invasive lobular carcinoma, grade 2, broadly present less than 1 mm to the anterior margin, zero out of four sentinel nodes positive, pathologic stage T1c., N0. ER 78%, PR 0. HER-2 negative. I have reviewed her pathology report with Dr. Chipper Herb, radiation oncology, and we both feel that since the tumor is very close in a broad area that this area should be reexcised.   Operative Findings:       I was able to enter the seroma cavity and clearly see the anterior portion of the lumpectomy cavity. I excised a 5 cm x 5 cm x 1 cm area of the anterior margin. Metal clips were placed in the lumpectomy cavity  Procedure in Detail:          Following induction of general LMA anesthesia the patient's left breast was prepped and draped in a sterile fashion. Intravenous antibiotics were given. Surgical time out was performed. 0.5% Marcaine with epinephrine was used as local infiltration anesthetic. Using a modified I reopened the transverse, radially oriented incision in the left breast at the 3:00  position. Dissection was carried down carefully taking out some of the Vicryl sutures. I then entered the seroma cavity and explored the lumpectomy cavity. After exposing this area I excised the anterior margin as described above. The margin was removed and the new margin was marked with ink and and sent fresh to the lab. Hemostasis was excellent and achieved with electrocautery. I was able to reconstruct the breast mound with numerous sutures of 3-0 Vicryl and the skin was closed with a running subcuticular suture of 4-0 Monocryl and Dermabond. Breast binder and ice pack were placed. The patient tolerated the procedure well. EBL 15 cc. Counts correct. Complications none.     [name]. Derrell Lolling, M.D., FACS General and Minimally Invasive Surgery Breast and Colorectal Surgery  12/25/2012 9:19 AM

## 2012-12-25 NOTE — Transfer of Care (Signed)
Immediate Anesthesia Transfer of Care Note  Patient: Elizabeth Hebert  Procedure(s) Performed: Procedure(s): MASTECTOMY PARTIAL with re-excision of margins (Left)  Patient Location: PACU  Anesthesia Type:General  Level of Consciousness: awake and patient cooperative  Airway & Oxygen Therapy: Patient Spontanous Breathing and Patient connected to face mask oxygen  Post-op Assessment: Report given to PACU RN and Post -op Vital signs reviewed and stable  Post vital signs: Reviewed and stable  Complications: No apparent anesthesia complications

## 2012-12-25 NOTE — Anesthesia Preprocedure Evaluation (Addendum)
Anesthesia Evaluation  Patient identified by MRN, date of birth, ID band Patient awake    Reviewed: Allergy & Precautions, NPO status   Airway       Dental   Pulmonary          Cardiovascular hypertension, Pt. on medications + angina + CAD, + Past MI and + Cardiac Stents + Valvular Problems/Murmurs     Neuro/Psych PSYCHIATRIC DISORDERS Depression    GI/Hepatic hiatal hernia, GERD-  Medicated,  Endo/Other  diabetes, Type 2, Oral Hypoglycemic Agents  Renal/GU      Musculoskeletal   Abdominal   Peds  Hematology   Anesthesia Other Findings   Reproductive/Obstetrics                           Anesthesia Physical Anesthesia Plan  ASA: III  Anesthesia Plan:    Post-op Pain Management:    Induction:   Airway Management Planned:   Additional Equipment:   Intra-op Plan:   Post-operative Plan:   Informed Consent:   Plan Discussed with:   Anesthesia Plan Comments:         Anesthesia Quick Evaluation

## 2012-12-25 NOTE — Anesthesia Procedure Notes (Signed)
Procedure Name: LMA Insertion Date/Time: 12/25/2012 8:32 AM Performed by: Atlas Crossland D Pre-anesthesia Checklist: Patient identified, Emergency Drugs available, Suction available and Patient being monitored Patient Re-evaluated:Patient Re-evaluated prior to inductionOxygen Delivery Method: Circle System Utilized Preoxygenation: Pre-oxygenation with 100% oxygen Intubation Type: IV induction Ventilation: Mask ventilation without difficulty LMA: LMA inserted LMA Size: 4.0 Number of attempts: 1 Airway Equipment and Method: bite block Placement Confirmation: positive ETCO2 Tube secured with: Tape Dental Injury: Teeth and Oropharynx as per pre-operative assessment

## 2012-12-25 NOTE — Interval H&P Note (Signed)
History and Physical Interval Note:  12/25/2012 8:05 AM  Elizabeth Hebert  has presented today for surgery, with the diagnosis of cancer left breast  The goals and the  various methods of treatment have been discussed with the patient and family. After consideration of risks, benefits and other options for treatment, the patient has consented to  Procedure(s): MASTECTOMY PARTIAL with re-excision of margins (Left) as a surgical intervention .  The patient's history has been reviewed, patient examined today, no change in status, stable for surgery.  I have reviewed the patient's chart and labs.  Questions were answered to the patient's satisfaction.     Ernestene Mention

## 2012-12-28 ENCOUNTER — Telehealth (INDEPENDENT_AMBULATORY_CARE_PROVIDER_SITE_OTHER): Payer: Self-pay

## 2012-12-28 ENCOUNTER — Encounter (HOSPITAL_BASED_OUTPATIENT_CLINIC_OR_DEPARTMENT_OTHER): Payer: Self-pay | Admitting: General Surgery

## 2012-12-28 NOTE — Telephone Encounter (Signed)
I notified the pt of the results, no residual cancer.  I told her we will give her a copy of the pathology at her follow up on 01/19/2013.

## 2012-12-28 NOTE — Progress Notes (Signed)
Quick Note:  Inform patient of Pathology report,.Good news. No residual cancer. ______

## 2012-12-28 NOTE — Telephone Encounter (Signed)
Message copied by Ivory Broad on Mon Dec 28, 2012  3:36 PM ------      Message from: Ernestene Mention      Created: Mon Dec 28, 2012  3:25 PM       Inform patient of Pathology report,.Good news. No residual cancer. ------

## 2012-12-31 ENCOUNTER — Encounter: Payer: Self-pay | Admitting: *Deleted

## 2012-12-31 NOTE — Progress Notes (Signed)
Oncotype Dx ordered.  Faxed order to Pathology.

## 2013-01-04 NOTE — Progress Notes (Signed)
Location of Breast Cancer:left breast  Histology per Pathology Report: 1. Lymph node, sentinel, biopsy, left axilla, node #1 - THERE IS NO EVIDENCE OF CARCINOMA IN 1 OF 1 LYMPH NODE (0/1). - SEE COMMENT. 2. Breast, lumpectomy, left - INVASIVE LOBULAR CARCINOMA, GRADE II/III, SPANNING 1.6 CM. - LOBULAR CARCINOMA IN SITU. - INVASIVE CARCINOMA IS BROADLY LESS THAN 0.1 CM TO THE ANTERIOR MARGIN - SEE ONCOLOGY TABLE BELOW. 3. Lymph node, sentinel, biopsy, left axilla, node #2 - THERE IS NO EVIDENCE OF CARCINOMA IN 1 OF 1 LYMPH NODE (0/1) 4. Lymph node, sentinel, biopsy, left axilla, node #3 - THERE IS NO EVIDENCE OF CARCINOMA IN 1 OF 1 LYMPH NODE (0/1). 5. Lymph node, sentinel, biopsy, left axilla, node #4 - THERE IS NO EVIDENCE OF CARCINOMA IN 1 OF 1 LYMPH NODE (0/1).  Receptor Status: ER(postiive), PR (negative), Her2-neu (negative)  Did patient present with symptoms (if so, please note symptoms) or was this found on screening mammography?: screening mammography  Past/Anticipated interventions by surgeon, if ZOX:WRUE PARTIAL MASTECTOMY WITH NEEDLE LOCALIZATION AND AXILLARY SENTINEL LYMPH NODE BX on 12/08/12 and left partial mastectomy, reexcision of anterior margin by Dr. Derrell Lolling on 12/25/12.  Past/Anticipated interventions by medical oncology, if any: neoadjuvant antiestrogen therapy with letrozole 2.5 mg daily.  Lymphedema issues, if any:  no    Pain issues, if any:  Soreness in incision area on left breast  SAFETY ISSUES:  Prior radiation? no  Pacemaker/ICD? no  Possible current pregnancy?no  Is the patient on methotrexate? no  Current Complaints / other details:  Elizabeth Hebert here for conult for left breast cancer.  She is currently taking letrozole which is giving her nausea and diarrhea.  She has been taking Imodium.

## 2013-01-05 ENCOUNTER — Encounter: Payer: Self-pay | Admitting: Radiation Oncology

## 2013-01-05 ENCOUNTER — Ambulatory Visit
Admit: 2013-01-05 | Discharge: 2013-01-05 | Disposition: A | Discharge status: Home / Self Care | Payer: Medicare Other | Attending: Radiation Oncology | Admitting: Radiation Oncology

## 2013-01-05 VITALS — BP 146/69 | HR 81 | Temp 98.5°F | Ht 61.0 in | Wt 177.9 lb

## 2013-01-05 DIAGNOSIS — C50412 Malignant neoplasm of upper-outer quadrant of left female breast: Secondary | ICD-10-CM

## 2013-01-05 DIAGNOSIS — C50419 Malignant neoplasm of upper-outer quadrant of unspecified female breast: Secondary | ICD-10-CM | POA: Diagnosis not present

## 2013-01-05 NOTE — Progress Notes (Addendum)
Followup note:  Diagnosis: Pathologic stage I (ypT1 ypN0 M0) invasive lobular carcinoma of the left breast  History: Elizabeth Hebert is a pleasant 67 year old female who is seen today through the courtesy of Dr. Derrell Lolling for initiation of left breast radiation therapy in the management of her pathologic stage TI N0 invasive lobular carcinoma of the left breast. At the time of a screening mammogram on 01/06/2012 she was noted to have a possible mass in the left breast. Additional views and ultrasound on 01/24/2012 showed a suspicious mass at 2:00 position of the left breast which is felt to be palpable. On ultrasound there was an ill-defined mass at 2:00 measuring 1.3 x 1.6 x 0.9 cm. Evaluation of the axilla was without adenopathy. An ultrasound guided core biopsy on 01/24/2012 was diagnostic for invasive mammary carcinoma with lobular features. Breast prognostic profile is pending at the time this dictation. Breast MR on 01/31/2012 should biopsy change measuring 3.0 x 1.9 x 1.4 cm with no definite adenopathy. She received 9 months of neoadjuvant letrozole hormone therapy and underwent a left partial mastectomy and sentinel lymph node biopsy. His then had a 1.6 cm invasive lobular carcinoma along with LCIS. Invasive carcinoma was "broadly less than 0.1 cm to the anterior margin ". All 4 lymph nodes were free of metastatic disease. Dr. Derrell Lolling and I had a discussion, we felt that she should have reexcision based on  the extent of her close margin. She underwent reexcision on 12/25/2012 and there is no evidence for residual carcinoma. She is doing well postoperatively. She is without complaints today.  Physical examination: Alert and oriented. Filed Vitals:   01/05/13 1330  BP: 146/69  Pulse: 81  Temp: 98.5 F (36.9 C)   Head and neck examination: Grossly unremarkable. Nodes: Without palpable cervical, supraclavicular, or axillary lymphadenopathy. Chest: Lungs clear. Breasts: There is a partial mastectomy wound  along the left breast at approximately 2:00. The wound is healing well. No masses are appreciated. Right breast without masses or lesions. Of note is that both nipples are inverted. Extremities: Without edema.  Impression: Pathologic stage I (ypT1 ypN0 M0) invasive lobular carcinoma of the left breast. Her prognosis is favorable. We again discussed local regional management options, and she is a good candidate for breast preservation. We discussed the potential acute and late toxicities of radiation therapy. I believe that she would be an excellent candidate for hypo-fractionated radiation therapy. She'll also be considered for deep inspiration/breath-hold technology to avoid cardiac irradiation. Consent is signed today.  Plan: As discussed above. She'll return later this month for simulation/treatment planning  30 minutes was spent face-to-face the patient, primarily counseling the patient and coordinating her care.

## 2013-01-05 NOTE — Addendum Note (Signed)
Encounter addended by: Maryln Gottron, MD on: 01/05/2013  4:11 PM<BR>     Documentation filed: Notes Section

## 2013-01-05 NOTE — Progress Notes (Signed)
Please see the Nurse Progress Note in the MD Initial Consult Encounter for this patient. 

## 2013-01-07 DIAGNOSIS — C50919 Malignant neoplasm of unspecified site of unspecified female breast: Secondary | ICD-10-CM | POA: Diagnosis not present

## 2013-01-08 ENCOUNTER — Encounter: Payer: Self-pay | Admitting: *Deleted

## 2013-01-08 NOTE — Progress Notes (Signed)
Received Oncotype Dx results of 29.  Gave copy to MD.  Rochele Pages copy to Med Rec to scan.

## 2013-01-13 DIAGNOSIS — E119 Type 2 diabetes mellitus without complications: Secondary | ICD-10-CM | POA: Diagnosis not present

## 2013-01-13 DIAGNOSIS — Z961 Presence of intraocular lens: Secondary | ICD-10-CM | POA: Diagnosis not present

## 2013-01-13 DIAGNOSIS — H52209 Unspecified astigmatism, unspecified eye: Secondary | ICD-10-CM | POA: Diagnosis not present

## 2013-01-14 DIAGNOSIS — R300 Dysuria: Secondary | ICD-10-CM | POA: Diagnosis not present

## 2013-01-14 DIAGNOSIS — N390 Urinary tract infection, site not specified: Secondary | ICD-10-CM | POA: Diagnosis not present

## 2013-01-15 ENCOUNTER — Encounter: Payer: Self-pay | Admitting: Oncology

## 2013-01-15 ENCOUNTER — Telehealth: Payer: Self-pay | Admitting: Oncology

## 2013-01-15 ENCOUNTER — Ambulatory Visit (HOSPITAL_BASED_OUTPATIENT_CLINIC_OR_DEPARTMENT_OTHER): Payer: Medicare Other | Admitting: Oncology

## 2013-01-15 VITALS — BP 151/77 | HR 80 | Temp 98.1°F | Resp 20 | Ht 61.0 in | Wt 178.2 lb

## 2013-01-15 DIAGNOSIS — Z171 Estrogen receptor negative status [ER-]: Secondary | ICD-10-CM | POA: Diagnosis not present

## 2013-01-15 DIAGNOSIS — C50419 Malignant neoplasm of upper-outer quadrant of unspecified female breast: Secondary | ICD-10-CM | POA: Diagnosis not present

## 2013-01-15 DIAGNOSIS — C50412 Malignant neoplasm of upper-outer quadrant of left female breast: Secondary | ICD-10-CM

## 2013-01-15 NOTE — Progress Notes (Signed)
OFFICE PROGRESS NOTE  CC Dr. [name] Dr. Claud Kelp Dr. Chipper Herb Dr. Huel Cote  DIAGNOSIS: 67 year old female with invasive lobular carcinoma of the left breast in the upper-outer quadrant stage IIA.  PRIOR THERAPY:  #1 patient originally presented to the multidisciplinary breast clinic in September 2013 with new diagnosis of left invasive lobular carcinoma by ultrasound measuring 1.6 and by MRI measuring 3 cm. She had undergone a needle core biopsy the tumor was lobular ER positive.  #2 she was seen by Dr. Dayton Scrape as well as Dr. Claud Kelp patient was interested in breast conservation. We recommended neoadjuvant antiestrogen therapy to try to reduce the size of the tumor for a good cosmetic result at this time of lumpectomy. She went on to receive letrozole 2.5 mg daily starting in 02/05/2012 through June 2014  #3 status post partial mastectomy with sentinel lymph node biopsy of the left breast. Final pathology did reveal residual disease measuring at T1 C. N0 ER positive PR negative HER-2/neu negative. Anterior margin was positive she is reexcised. She will now begin radiation therapy  CURRENT THERAPY: Radiation therapy  INTERVAL HISTORY: Elizabeth Hebert 67 y.o. female returns for followup visit. Overall she's tolerated the surgery well without any problems. She looks good her lumpectomy incision is healing well. She has no nausea vomiting fevers chills night sweats headaches shortness of breath chest pains or palpitations. Remainder of the 10 point review of systems is negative. MEDICAL HISTORY: Past Medical History  Diagnosis Date  . Colitis   . Diverticulitis   . Fibroid tumor   . Anemia   . Broken arm 2011  . Hx of hernia repair   . History of cataract surgery 2012  . Anginal pain     infreq  . Hypertension   . Myocardial infarction     stents x 2 when had mi 99  . Depression   . Diabetes mellitus without complication   . Heart murmur   .  GERD (gastroesophageal reflux disease)   . H/O hiatal hernia   . Cancer     breaast  . Arthritis     ALLERGIES:  is allergic to vasotec.  MEDICATIONS:  Current Outpatient Prescriptions  Medication Sig Dispense Refill  . amLODipine-olmesartan (AZOR) 5-40 MG per tablet Take 1 tablet by mouth daily.      Marland Kitchen ascorbic acid (VITAMIN C) 1000 MG tablet Take 1,000 mg by mouth daily.      Marland Kitchen aspirin 81 MG chewable tablet Chew 81 mg by mouth daily.      Marland Kitchen CALCIUM-MAGNESIUM-VITAMIN D PO Take 370 mg by mouth daily.      . cimetidine (TAGAMET) 200 MG tablet Take 200 mg by mouth 2 (two) times daily.      . Cranberry 500 MG CAPS Take 1 capsule by mouth daily.      . digoxin (LANOXIN) 0.125 MG tablet Take 0.125 mg by mouth daily.      . Exenatide ER (BYDUREON) 2 MG SUSR Inject 2 mg into the skin once a week.       . ezetimibe (ZETIA) 10 MG tablet Take 10 mg by mouth daily.      . ferrous sulfate 325 (65 FE) MG tablet Take 325 mg by mouth daily with breakfast.      . glimepiride (AMARYL) 4 MG tablet Take 4 mg by mouth daily before breakfast.      . HYDROcodone-acetaminophen (NORCO/VICODIN) 5-325 MG per tablet Take 1-2 tablets by mouth every 4 (four)  hours as needed for pain.  35 tablet  1  . HYDROcodone-acetaminophen (NORCO/VICODIN) 5-325 MG per tablet Take 1-2 tablets by mouth every 4 (four) hours as needed for pain.  30 tablet  0  . letrozole (FEMARA) 2.5 MG tablet Take 2.5 mg by mouth daily.       . metFORMIN (GLUCOPHAGE) 1000 MG tablet Take 1,000 mg by mouth 2 (two) times daily with a meal.      . metoprolol succinate (TOPROL-XL) 50 MG 24 hr tablet Take 50 mg by mouth daily. Take with or immediately following a meal.      . Multiple Vitamin (MULTIVITAMIN) capsule Take 1 capsule by mouth daily.      . nitrofurantoin, macrocrystal-monohydrate, (MACROBID) 100 MG capsule       . nitroGLYCERIN (NITROSTAT) 0.4 MG SL tablet Place 0.4 mg under the tongue every 5 (five) minutes as needed for chest pain.        . rosuvastatin (CRESTOR) 20 MG tablet Take 20 mg by mouth daily.      . sertraline (ZOLOFT) 50 MG tablet Take 50 mg by mouth daily.      . sitaGLIPtin (JANUVIA) 100 MG tablet Take 100 mg by mouth daily.       No current facility-administered medications for this visit.    SURGICAL HISTORY:  Past Surgical History  Procedure Laterality Date  . Cholecystectomy    . Colon resection    . Total abdominal hysterectomy  2001  . Eye surgery Bilateral     cat   . Hernia repair      rt  . Appendectomy    . Partial mastectomy with needle localization and axillary sentinel lymph node bx Left 12/08/2012    Procedure: LEFT PARTIAL MASTECTOMY WITH NEEDLE LOCALIZATION AND AXILLARY SENTINEL LYMPH NODE BIOPSIES TIMES FOUR;  Surgeon: Ernestene Mention, MD;  Location: [facility] OR;  Service: General;  Laterality: Left;  Marland Kitchen Mastectomy, partial Left 12/25/2012    Procedure: MASTECTOMY PARTIAL with re-excision of margins;  Surgeon: Ernestene Mention, MD;  Location: [facility];  Service: General;  Laterality: Left;    REVIEW OF SYSTEMS:  Pertinent items are noted in HPI.   HEALTH MAINTENANCE:  PHYSICAL EXAMINATION: Blood pressure 151/77, pulse 80, temperature 98.1 F (36.7 C), temperature source Oral, resp. rate 20, height 5\' 1"  (1.549 m), weight 178 lb 3.2 oz (80.831 kg). Body mass index is 33.69 kg/(m^2). ECOG PERFORMANCE STATUS: 0 - Asymptomatic   General appearance: alert, cooperative and appears stated age Neck: no adenopathy, no carotid bruit, no JVD, supple, symmetrical, trachea midline and thyroid not enlarged, symmetric, no tenderness/mass/nodules Lymph nodes: Cervical, supraclavicular, and axillary nodes normal. Resp: clear to auscultation bilaterally Back: symmetric, no curvature. ROM normal. No CVA tenderness. Cardio: regular rate and rhythm GI: soft, non-tender; bowel sounds normal; no masses,  no organomegaly Extremities: extremities normal, atraumatic, no cyanosis or  edema Neurologic: Grossly normal Left breast examination no skin nodularity I am barely able to palpate the mass there is no nipple discharge or retraction or inversion. Right breast no masses or nipple discharge.  LABORATORY DATA: Lab Results  Component Value Date   WBC 11.2* 12/07/2012   HGB 13.8 12/25/2012   HCT 40.0 12/07/2012   MCV 81.8 12/07/2012   PLT 303 12/07/2012      Chemistry      Component Value Date/Time   NA 137 12/07/2012 0927   NA 141 10/20/2012 0816   K 3.7 12/07/2012 0927   K  4.1 10/20/2012 0816   CL 100 12/07/2012 0927   CL 102 10/20/2012 0816   CO2 24 12/07/2012 0927   CO2 28 10/20/2012 0816   BUN 8 12/07/2012 0927   BUN 4.9* 10/20/2012 0816   CREATININE 0.67 12/07/2012 0927   CREATININE 0.8 10/20/2012 0816      Component Value Date/Time   CALCIUM 9.7 12/07/2012 0927   CALCIUM 9.8 10/20/2012 0816   ALKPHOS 67 12/07/2012 0927   ALKPHOS 65 10/20/2012 0816   AST 38* 12/07/2012 0927   AST 39* 10/20/2012 0816   ALT 30 12/07/2012 0927   ALT 43 10/20/2012 0816   BILITOT 0.3 12/07/2012 0927   BILITOT 0.54 10/20/2012 0816      RADIOGRAPHIC STUDIES:  BILATERAL BREAST MRI WITH AND WITHOUT CONTRAST  Technique: Multiplanar, multisequence MR images of both breasts  were obtained prior to and following the intravenous administration  of 17ml of Multihance. Three dimensional images were evaluated at  the independent DynaCad workstation.  Comparison: 01/31/2012 breast MRI.  Findings: There is minimal bilateral parenchymal background  enhancement. The previously seen 3.0 x 1.9 x 1.4 cm irregular mass-  like enhancement located within the left upper-outer quadrant has  markedly decreased in enhancement and has decreased in size now  measuring 1.9 x 1.0 x 1.0 cm in size. There are no other areas of  worrisome enhancement within either breast. The previously  discussed left axillary lymph nodes appear stable. There is no  evidence for axillary adenopathy or internal mammary adenopathy.  There are  no additional findings.  IMPRESSION:  Interval decrease in size and enhancement associated with the left  breast mass located within the upper-outer quadrant now measuring  1.9 x 1.0 x 1.0 cm in size. No additional findings.  RECOMMENDATION:  Treatment plan.   ASSESSMENT: 67 year old female with  #1 stage IIa invasive lobular carcinoma measuring 3.0 cm of the left breast status post needle core biopsy performed in August 2013. Patient is now on neoadjuvant antiestrogen therapy with letrozole 2.5 mg daily. She seems to be tolerating it very well.  #2 . MRI of the breast does reveal some enlargement of the disease. Her MRI performed in February had shown the disease to be 1.9 cm. Now in June MRI it is 2.1 cm.  #3 patient is status post left partial mastectomy and sentinel lymph node biopsy after receiving 9 months of neoadjuvant letrozole. Her final pathology revealed invasive lobular carcinoma, grade 2, Elige Radon present less than 1 mm to the anterior margin 004 sentinel nodes positive. Pathologic stage TI C. N0 ER +70% PR negative HER-2/neu negative. Patient's anterior margin was close and she has had a reexcision. This was performed on 12/25/2012. It showed no residual malignancy.  #4 patient now to begin radiation therapy with Dr. Chipper Herb.  PLAN:  #1 proceed with radiation therapy.  #2 once she completes this she will continue the letrozole for a total of 5 years  All questions were answered. The patient knows to call the clinic with any problems, questions or concerns. We can certainly see the patient much sooner if necessary.  I spent 25 minutes counseling the patient face to face. The total time spent in the appointment was 30 minutes.    Drue Second, MD Medical/Oncology [facility] [phone] (beeper) [phone] (Office)  01/15/2013, 12:09 PM

## 2013-01-18 ENCOUNTER — Ambulatory Visit
Admission: RE | Admit: 2013-01-18 | Discharge: 2013-01-18 | Disposition: A | Discharge status: Home / Self Care | Payer: Medicare Other | Source: Ambulatory Visit | Attending: Radiation Oncology | Admitting: Radiation Oncology

## 2013-01-18 DIAGNOSIS — N6459 Other signs and symptoms in breast: Secondary | ICD-10-CM | POA: Insufficient documentation

## 2013-01-18 DIAGNOSIS — L299 Pruritus, unspecified: Secondary | ICD-10-CM | POA: Diagnosis not present

## 2013-01-18 DIAGNOSIS — C50412 Malignant neoplasm of upper-outer quadrant of left female breast: Secondary | ICD-10-CM

## 2013-01-18 DIAGNOSIS — C50419 Malignant neoplasm of upper-outer quadrant of unspecified female breast: Secondary | ICD-10-CM | POA: Diagnosis not present

## 2013-01-18 DIAGNOSIS — Z510 Encounter for antineoplastic radiation therapy: Secondary | ICD-10-CM | POA: Insufficient documentation

## 2013-01-18 NOTE — Progress Notes (Signed)
Complex simulation/treatment planning note: The patient was placed on a custom breast board for immobilization. A custom neck mold was constructed. Her left breast borders were marked with radiopaque wires. She was scanned free breathing. The cardiac silhouette was within the tangent field. She was then rescanned with deep inspiration/breath-hold. There was satisfactory movement of the cardiac silhouette. I contoured her tumor bed and also cardiac structures. She was set up to medial and lateral left breast tangents. She'll now undergo 3-D conformal simulation. I prescribing 4250 cGy 17 sessions utilizing mixed 6 MV/10 MV photons. I notice at the medial to lateral intrathecal distance was approximately 25 cm, and she was felt to be a candidate for hypo-fractionated therapy.

## 2013-01-19 ENCOUNTER — Ambulatory Visit (INDEPENDENT_AMBULATORY_CARE_PROVIDER_SITE_OTHER): Payer: Medicare Other | Admitting: General Surgery

## 2013-01-19 ENCOUNTER — Encounter (INDEPENDENT_AMBULATORY_CARE_PROVIDER_SITE_OTHER): Payer: Self-pay | Admitting: General Surgery

## 2013-01-19 VITALS — BP 142/80 | HR 76 | Temp 98.0°F | Resp 15 | Ht 61.0 in | Wt 180.0 lb

## 2013-01-19 DIAGNOSIS — C50412 Malignant neoplasm of upper-outer quadrant of left female breast: Secondary | ICD-10-CM

## 2013-01-19 DIAGNOSIS — C50419 Malignant neoplasm of upper-outer quadrant of unspecified female breast: Secondary | ICD-10-CM

## 2013-01-19 NOTE — Patient Instructions (Signed)
Your left breast lumpectomy site is healing without any obvious complications.  We have reviewed your pathology report from the initial surgery, and your pathology report from the reexcision. Fortunately, the reexcision shows no malignancy.  I agree with going ahead with the radiation therapy.  Return to see Dr. Derrell Lolling in January.

## 2013-01-19 NOTE — Progress Notes (Signed)
Patient ID: Elizabeth Hebert, female   DOB: [birth year], 67 y.o.   MRN: [id number] History:  Elizabeth Hebert is a 67 y.o. female. She recently underwent left partial mastectomy and sentinel lymph node biopsy following 9 months of neoadjuvant letrozole.  At the time of surgery, the marker clip and localizing wire were in the center of the specimen and it was felt that we had widely negative margins. Final pathology report showed invasive lobular carcinoma, grade 2, broadly present less than 1 mm to the anterior margin, zero out of four sentinel nodes positive, pathologic stage T1c., N0. ER 78%, PR 0. HER-2 negative. I have reviewed her pathology report with Dr. Chipper Herb, radiation oncology, and we decided to re excise the anterior margin. Anterior margin re-excision was performed on 12/25/2012, and fortunately this showed no residual malignancy. She has no complaints. She is feeling fine. She saw Dr. Dayton Scrape and will start her radiation therapy next week. She continues to take letrozole and continues followup Dr. Drue Second.  Exam: Patient looks well. No distress Left breast is examined. The lumpectomy incision laterally is healing uneventfully. The breast actually looks very good. Contour is good. No infection. No drainage. Nipple projection is symmetrical  Assessment: Invasive lobular carcinoma left breast, upper outer quadrant, ER positive, PR negative, HER-2-negative. Stage ypT1c, ypN0. Status post neoadjuvant antiestrogen therapy with good response. Status post left partial mastectomy with needle localization, sentinel lode biopsy, and subsequent reexcision of anterior margin with clear margins. Recovering uneventfully  Plan: Proceed with radiation therapy. Continue letrozole Return to see me in January.   [name]. Derrell Lolling, M.D., [facility] General and Minimally invasive Surgery Breast and Colorectal Surgery Office:   [phone] Pager:    [phone]

## 2013-01-21 ENCOUNTER — Encounter: Payer: Self-pay | Admitting: Radiation Oncology

## 2013-01-21 ENCOUNTER — Telehealth: Payer: Self-pay | Admitting: Dietician

## 2013-01-21 DIAGNOSIS — Z510 Encounter for antineoplastic radiation therapy: Secondary | ICD-10-CM | POA: Diagnosis not present

## 2013-01-21 DIAGNOSIS — L299 Pruritus, unspecified: Secondary | ICD-10-CM | POA: Diagnosis not present

## 2013-01-21 DIAGNOSIS — C50419 Malignant neoplasm of upper-outer quadrant of unspecified female breast: Secondary | ICD-10-CM | POA: Diagnosis not present

## 2013-01-21 DIAGNOSIS — N6459 Other signs and symptoms in breast: Secondary | ICD-10-CM | POA: Diagnosis not present

## 2013-01-21 NOTE — Progress Notes (Signed)
  Radiation Oncology         ([phone]) [phone] ________________________________  Name: Elizabeth Hebert MRN: [id number]  Date: 01/21/2013  DOB: 1945/09/05  RESPIRATORY MOTION MANAGEMENT SIMULATION  NARRATIVE:  In order to account for effect of respiratory motion on target structures and other organs in the planning and delivery of radiotherapy, this patient underwent respiratory motion management simulation.  To accomplish this, when the patient was brought to the CT simulation planning suite, 4D respiratory motion management CT images were obtained.  The CT images were loaded into the planning software.  Then, using a variety of tools including Cine, MIP, and standard views, the target volume and planning target volumes (PTV) were delineated.  Avoidance structures were contoured.  Treatment planning then occurred.  Dose volume histograms were generated and reviewed for each of the requested structure.  The resulting plan was carefully reviewed and approved today.  3-D simulation note: The patient completed 3-D simulation for treatment to her left breast. She is setup to tangential fields. 2 sets of multileaf collimators were designed to conform the tangential fields . Dose volume histograms were obtained for the target structures (left breast) and avoidance structures including the lungs and heart. We met our departmental guidelines. I chose the 97% isodose curve to cover the planning target volume. I prescribing 4250 cGy in 17 sessions utilizing mixed 6 MV/10 MV photons.

## 2013-01-25 ENCOUNTER — Ambulatory Visit
Admission: RE | Admit: 2013-01-25 | Discharge: 2013-01-25 | Disposition: A | Discharge status: Home / Self Care | Payer: Medicare Other | Source: Ambulatory Visit | Attending: Radiation Oncology | Admitting: Radiation Oncology

## 2013-01-25 DIAGNOSIS — C50412 Malignant neoplasm of upper-outer quadrant of left female breast: Secondary | ICD-10-CM

## 2013-01-25 DIAGNOSIS — C50419 Malignant neoplasm of upper-outer quadrant of unspecified female breast: Secondary | ICD-10-CM | POA: Diagnosis not present

## 2013-01-25 DIAGNOSIS — Z510 Encounter for antineoplastic radiation therapy: Secondary | ICD-10-CM | POA: Diagnosis not present

## 2013-01-25 DIAGNOSIS — N6459 Other signs and symptoms in breast: Secondary | ICD-10-CM | POA: Diagnosis not present

## 2013-01-25 DIAGNOSIS — L299 Pruritus, unspecified: Secondary | ICD-10-CM | POA: Diagnosis not present

## 2013-01-25 NOTE — Progress Notes (Signed)
Simulation verification note: The patient underwent simulation verification for treatment to her left breast with deep inspiration/breath-hold. Her isocenter is in good position and the multileaf collimators contoured the treatment volume appropriately.

## 2013-01-26 ENCOUNTER — Ambulatory Visit
Admission: RE | Admit: 2013-01-26 | Discharge: 2013-01-26 | Disposition: A | Discharge status: Home / Self Care | Payer: Medicare Other | Source: Ambulatory Visit | Attending: Radiation Oncology | Admitting: Radiation Oncology

## 2013-01-26 ENCOUNTER — Ambulatory Visit: Admission: RE | Admit: 2013-01-26 | Payer: Medicare Other | Source: Ambulatory Visit | Admitting: Radiation Oncology

## 2013-01-26 DIAGNOSIS — C50412 Malignant neoplasm of upper-outer quadrant of left female breast: Secondary | ICD-10-CM

## 2013-01-26 DIAGNOSIS — C50419 Malignant neoplasm of upper-outer quadrant of unspecified female breast: Secondary | ICD-10-CM | POA: Diagnosis not present

## 2013-01-26 DIAGNOSIS — Z510 Encounter for antineoplastic radiation therapy: Secondary | ICD-10-CM | POA: Diagnosis not present

## 2013-01-26 DIAGNOSIS — N6459 Other signs and symptoms in breast: Secondary | ICD-10-CM | POA: Diagnosis not present

## 2013-01-26 DIAGNOSIS — L299 Pruritus, unspecified: Secondary | ICD-10-CM | POA: Diagnosis not present

## 2013-01-26 MED ORDER — RADIAPLEXRX EX GEL
Freq: Once | CUTANEOUS | Status: AC
  Administered 2013-01-26: 10:00:00 via TOPICAL

## 2013-01-26 MED ORDER — ALRA NON-METALLIC DEODORANT (RAD-ONC)
1.0000 "application " | Freq: Once | TOPICAL | Status: AC
  Administered 2013-01-26: 1 via TOPICAL

## 2013-01-26 NOTE — Progress Notes (Signed)
Post sim ed completed w/pt.  Gave pt "Radiation and You" booklet w/all pertinent information marked and discussed, re: fatigue, skin irritation/care, nutrition, pain. All questions answered. Gave pt Radiaplex, Alra w/instructions for proper use. Pt verbalized understanding.

## 2013-01-27 ENCOUNTER — Ambulatory Visit
Admission: RE | Admit: 2013-01-27 | Discharge: 2013-01-27 | Disposition: A | Discharge status: Home / Self Care | Payer: Medicare Other | Source: Ambulatory Visit | Attending: Radiation Oncology | Admitting: Radiation Oncology

## 2013-01-27 ENCOUNTER — Ambulatory Visit: Payer: Medicare Other

## 2013-01-27 DIAGNOSIS — C50419 Malignant neoplasm of upper-outer quadrant of unspecified female breast: Secondary | ICD-10-CM | POA: Diagnosis not present

## 2013-01-27 DIAGNOSIS — L299 Pruritus, unspecified: Secondary | ICD-10-CM | POA: Diagnosis not present

## 2013-01-27 DIAGNOSIS — Z510 Encounter for antineoplastic radiation therapy: Secondary | ICD-10-CM | POA: Diagnosis not present

## 2013-01-27 DIAGNOSIS — N6459 Other signs and symptoms in breast: Secondary | ICD-10-CM | POA: Diagnosis not present

## 2013-01-28 ENCOUNTER — Ambulatory Visit
Admission: RE | Admit: 2013-01-28 | Discharge: 2013-01-28 | Disposition: A | Discharge status: Home / Self Care | Payer: Medicare Other | Source: Ambulatory Visit | Attending: Radiation Oncology | Admitting: Radiation Oncology

## 2013-01-28 ENCOUNTER — Ambulatory Visit: Payer: Medicare Other

## 2013-01-28 DIAGNOSIS — C50419 Malignant neoplasm of upper-outer quadrant of unspecified female breast: Secondary | ICD-10-CM | POA: Diagnosis not present

## 2013-01-28 DIAGNOSIS — N6459 Other signs and symptoms in breast: Secondary | ICD-10-CM | POA: Diagnosis not present

## 2013-01-28 DIAGNOSIS — L299 Pruritus, unspecified: Secondary | ICD-10-CM | POA: Diagnosis not present

## 2013-01-28 DIAGNOSIS — Z510 Encounter for antineoplastic radiation therapy: Secondary | ICD-10-CM | POA: Diagnosis not present

## 2013-01-29 ENCOUNTER — Ambulatory Visit
Admission: RE | Admit: 2013-01-29 | Discharge: 2013-01-29 | Disposition: A | Discharge status: Home / Self Care | Payer: Medicare Other | Source: Ambulatory Visit | Attending: Radiation Oncology | Admitting: Radiation Oncology

## 2013-01-29 ENCOUNTER — Ambulatory Visit: Payer: Medicare Other

## 2013-01-29 DIAGNOSIS — Z510 Encounter for antineoplastic radiation therapy: Secondary | ICD-10-CM | POA: Diagnosis not present

## 2013-01-29 DIAGNOSIS — L299 Pruritus, unspecified: Secondary | ICD-10-CM | POA: Diagnosis not present

## 2013-01-29 DIAGNOSIS — N6459 Other signs and symptoms in breast: Secondary | ICD-10-CM | POA: Diagnosis not present

## 2013-01-29 DIAGNOSIS — C50419 Malignant neoplasm of upper-outer quadrant of unspecified female breast: Secondary | ICD-10-CM | POA: Diagnosis not present

## 2013-02-02 ENCOUNTER — Encounter: Payer: Self-pay | Admitting: Radiation Oncology

## 2013-02-02 ENCOUNTER — Ambulatory Visit
Admission: RE | Admit: 2013-02-02 | Discharge: 2013-02-02 | Disposition: A | Discharge status: Home / Self Care | Payer: Medicare Other | Source: Ambulatory Visit | Attending: Radiation Oncology | Admitting: Radiation Oncology

## 2013-02-02 ENCOUNTER — Ambulatory Visit: Payer: Medicare Other

## 2013-02-02 VITALS — Resp 16 | Wt 180.6 lb

## 2013-02-02 DIAGNOSIS — C50412 Malignant neoplasm of upper-outer quadrant of left female breast: Secondary | ICD-10-CM

## 2013-02-02 DIAGNOSIS — L299 Pruritus, unspecified: Secondary | ICD-10-CM | POA: Diagnosis not present

## 2013-02-02 DIAGNOSIS — N6459 Other signs and symptoms in breast: Secondary | ICD-10-CM | POA: Diagnosis not present

## 2013-02-02 DIAGNOSIS — Z510 Encounter for antineoplastic radiation therapy: Secondary | ICD-10-CM | POA: Diagnosis not present

## 2013-02-02 DIAGNOSIS — C50419 Malignant neoplasm of upper-outer quadrant of unspecified female breast: Secondary | ICD-10-CM | POA: Diagnosis not present

## 2013-02-02 NOTE — Progress Notes (Signed)
Denies skin changes at this time. Reports using radiaplex and alra as directed. Reports lack of energy but, understands it is unrelated to radiation therapy this early in treatment.

## 2013-02-02 NOTE — Progress Notes (Signed)
Weekly Management Note:  Site: Left breast Current Dose:  1250  cGy Projected Dose: 4250  cGy, no boost  Narrative: The patient is seen today for routine under treatment assessment. CBCT/MVCT images/port films were reviewed. The chart was reviewed.   She is without complaints today. She uses Radioplex gel. She declined adjuvant chemotherapy with intermediate Oncotype DX score.  Physical Examination:  Filed Vitals:   02/02/13 0927  Resp: 16  .  Weight: 180 lb 9.6 oz (81.92 kg). A significant skin changes.  Impression: Tolerating radiation therapy well.  Plan: Continue radiation therapy as planned.

## 2013-02-03 ENCOUNTER — Ambulatory Visit
Admission: RE | Admit: 2013-02-03 | Discharge: 2013-02-03 | Disposition: A | Discharge status: Home / Self Care | Payer: Medicare Other | Source: Ambulatory Visit | Attending: Radiation Oncology | Admitting: Radiation Oncology

## 2013-02-03 ENCOUNTER — Ambulatory Visit: Payer: Medicare Other

## 2013-02-03 DIAGNOSIS — L299 Pruritus, unspecified: Secondary | ICD-10-CM | POA: Diagnosis not present

## 2013-02-03 DIAGNOSIS — N6459 Other signs and symptoms in breast: Secondary | ICD-10-CM | POA: Diagnosis not present

## 2013-02-03 DIAGNOSIS — Z510 Encounter for antineoplastic radiation therapy: Secondary | ICD-10-CM | POA: Diagnosis not present

## 2013-02-03 DIAGNOSIS — C50419 Malignant neoplasm of upper-outer quadrant of unspecified female breast: Secondary | ICD-10-CM | POA: Diagnosis not present

## 2013-02-04 ENCOUNTER — Ambulatory Visit
Admission: RE | Admit: 2013-02-04 | Discharge: 2013-02-04 | Disposition: A | Discharge status: Home / Self Care | Payer: Medicare Other | Source: Ambulatory Visit | Attending: Radiation Oncology | Admitting: Radiation Oncology

## 2013-02-04 ENCOUNTER — Ambulatory Visit: Payer: Medicare Other

## 2013-02-04 DIAGNOSIS — N6459 Other signs and symptoms in breast: Secondary | ICD-10-CM | POA: Diagnosis not present

## 2013-02-04 DIAGNOSIS — C50419 Malignant neoplasm of upper-outer quadrant of unspecified female breast: Secondary | ICD-10-CM | POA: Diagnosis not present

## 2013-02-04 DIAGNOSIS — Z510 Encounter for antineoplastic radiation therapy: Secondary | ICD-10-CM | POA: Diagnosis not present

## 2013-02-04 DIAGNOSIS — L299 Pruritus, unspecified: Secondary | ICD-10-CM | POA: Diagnosis not present

## 2013-02-05 ENCOUNTER — Ambulatory Visit
Admission: RE | Admit: 2013-02-05 | Discharge: 2013-02-05 | Disposition: A | Discharge status: Home / Self Care | Payer: Medicare Other | Source: Ambulatory Visit | Attending: Radiation Oncology | Admitting: Radiation Oncology

## 2013-02-05 ENCOUNTER — Ambulatory Visit: Payer: Medicare Other

## 2013-02-05 DIAGNOSIS — C50419 Malignant neoplasm of upper-outer quadrant of unspecified female breast: Secondary | ICD-10-CM | POA: Diagnosis not present

## 2013-02-05 DIAGNOSIS — Z510 Encounter for antineoplastic radiation therapy: Secondary | ICD-10-CM | POA: Diagnosis not present

## 2013-02-05 DIAGNOSIS — L299 Pruritus, unspecified: Secondary | ICD-10-CM | POA: Diagnosis not present

## 2013-02-05 DIAGNOSIS — N6459 Other signs and symptoms in breast: Secondary | ICD-10-CM | POA: Diagnosis not present

## 2013-02-08 ENCOUNTER — Ambulatory Visit
Admission: RE | Admit: 2013-02-08 | Discharge: 2013-02-08 | Disposition: A | Discharge status: Home / Self Care | Payer: Medicare Other | Source: Ambulatory Visit | Attending: Radiation Oncology | Admitting: Radiation Oncology

## 2013-02-08 ENCOUNTER — Ambulatory Visit: Payer: Medicare Other

## 2013-02-08 VITALS — BP 143/74 | HR 70 | Temp 98.7°F | Wt 180.4 lb

## 2013-02-08 DIAGNOSIS — C50419 Malignant neoplasm of upper-outer quadrant of unspecified female breast: Secondary | ICD-10-CM | POA: Diagnosis not present

## 2013-02-08 DIAGNOSIS — L299 Pruritus, unspecified: Secondary | ICD-10-CM | POA: Diagnosis not present

## 2013-02-08 DIAGNOSIS — Z510 Encounter for antineoplastic radiation therapy: Secondary | ICD-10-CM | POA: Diagnosis not present

## 2013-02-08 DIAGNOSIS — C50412 Malignant neoplasm of upper-outer quadrant of left female breast: Secondary | ICD-10-CM

## 2013-02-08 DIAGNOSIS — N6459 Other signs and symptoms in breast: Secondary | ICD-10-CM | POA: Diagnosis not present

## 2013-02-08 NOTE — Progress Notes (Signed)
Patient here for weekly assessment of left breast radiation.Completed 9 of 17 treatments.Mild redness of breast.To continue application of radiaplex.Denies pain or any other concerns.

## 2013-02-08 NOTE — Progress Notes (Signed)
   Weekly Management Note:  Outpatient Current Dose:  22.5 Gy  Projected Dose: 42.5 Gy   Narrative:  The patient presents for routine under treatment assessment.  CBCT/MVCT images/Port film x-rays were reviewed.  The chart was checked. No new complaints  Physical Findings:  weight is 180 lb 6.4 oz (81.829 kg). Her temperature is 98.7 F (37.1 C). Her blood pressure is 143/74 and her pulse is 70.  modest erythema, left breast. Skin intact. Nipple invereted  Impression:  The patient is tolerating radiotherapy.  Plan:  Continue radiotherapy as planned.   ________________________________   Lonie Peak, M.D.

## 2013-02-09 ENCOUNTER — Ambulatory Visit
Admission: RE | Admit: 2013-02-09 | Discharge: 2013-02-09 | Disposition: A | Discharge status: Home / Self Care | Payer: Medicare Other | Source: Ambulatory Visit | Attending: Radiation Oncology | Admitting: Radiation Oncology

## 2013-02-09 ENCOUNTER — Ambulatory Visit: Payer: Medicare Other

## 2013-02-09 DIAGNOSIS — N6459 Other signs and symptoms in breast: Secondary | ICD-10-CM | POA: Diagnosis not present

## 2013-02-09 DIAGNOSIS — C50419 Malignant neoplasm of upper-outer quadrant of unspecified female breast: Secondary | ICD-10-CM | POA: Diagnosis not present

## 2013-02-09 DIAGNOSIS — Z510 Encounter for antineoplastic radiation therapy: Secondary | ICD-10-CM | POA: Diagnosis not present

## 2013-02-09 DIAGNOSIS — L299 Pruritus, unspecified: Secondary | ICD-10-CM | POA: Diagnosis not present

## 2013-02-10 ENCOUNTER — Ambulatory Visit: Payer: Medicare Other

## 2013-02-10 ENCOUNTER — Ambulatory Visit
Admission: RE | Admit: 2013-02-10 | Discharge: 2013-02-10 | Disposition: A | Discharge status: Home / Self Care | Payer: Medicare Other | Source: Ambulatory Visit | Attending: Radiation Oncology | Admitting: Radiation Oncology

## 2013-02-10 DIAGNOSIS — C50419 Malignant neoplasm of upper-outer quadrant of unspecified female breast: Secondary | ICD-10-CM | POA: Diagnosis not present

## 2013-02-10 DIAGNOSIS — L299 Pruritus, unspecified: Secondary | ICD-10-CM | POA: Diagnosis not present

## 2013-02-10 DIAGNOSIS — Z510 Encounter for antineoplastic radiation therapy: Secondary | ICD-10-CM | POA: Diagnosis not present

## 2013-02-10 DIAGNOSIS — N6459 Other signs and symptoms in breast: Secondary | ICD-10-CM | POA: Diagnosis not present

## 2013-02-11 ENCOUNTER — Ambulatory Visit
Admission: RE | Admit: 2013-02-11 | Discharge: 2013-02-11 | Disposition: A | Discharge status: Home / Self Care | Payer: Medicare Other | Source: Ambulatory Visit | Attending: Radiation Oncology | Admitting: Radiation Oncology

## 2013-02-11 ENCOUNTER — Ambulatory Visit: Payer: Medicare Other

## 2013-02-11 DIAGNOSIS — Z510 Encounter for antineoplastic radiation therapy: Secondary | ICD-10-CM | POA: Diagnosis not present

## 2013-02-11 DIAGNOSIS — C50419 Malignant neoplasm of upper-outer quadrant of unspecified female breast: Secondary | ICD-10-CM | POA: Diagnosis not present

## 2013-02-11 DIAGNOSIS — L299 Pruritus, unspecified: Secondary | ICD-10-CM | POA: Diagnosis not present

## 2013-02-11 DIAGNOSIS — N6459 Other signs and symptoms in breast: Secondary | ICD-10-CM | POA: Diagnosis not present

## 2013-02-12 ENCOUNTER — Ambulatory Visit
Admission: RE | Admit: 2013-02-12 | Discharge: 2013-02-12 | Disposition: A | Discharge status: Home / Self Care | Payer: Medicare Other | Source: Ambulatory Visit | Attending: Radiation Oncology | Admitting: Radiation Oncology

## 2013-02-12 ENCOUNTER — Ambulatory Visit: Payer: Medicare Other

## 2013-02-12 DIAGNOSIS — C50419 Malignant neoplasm of upper-outer quadrant of unspecified female breast: Secondary | ICD-10-CM | POA: Diagnosis not present

## 2013-02-12 DIAGNOSIS — N6459 Other signs and symptoms in breast: Secondary | ICD-10-CM | POA: Diagnosis not present

## 2013-02-12 DIAGNOSIS — L299 Pruritus, unspecified: Secondary | ICD-10-CM | POA: Diagnosis not present

## 2013-02-12 DIAGNOSIS — Z510 Encounter for antineoplastic radiation therapy: Secondary | ICD-10-CM | POA: Diagnosis not present

## 2013-02-15 ENCOUNTER — Encounter: Payer: Self-pay | Admitting: Radiation Oncology

## 2013-02-15 ENCOUNTER — Ambulatory Visit: Payer: Medicare Other

## 2013-02-15 ENCOUNTER — Ambulatory Visit
Admission: RE | Admit: 2013-02-15 | Discharge: 2013-02-15 | Disposition: A | Discharge status: Home / Self Care | Payer: Medicare Other | Source: Ambulatory Visit | Attending: Radiation Oncology | Admitting: Radiation Oncology

## 2013-02-15 VITALS — BP 145/89 | HR 69 | Temp 98.2°F | Resp 20 | Wt 182.2 lb

## 2013-02-15 DIAGNOSIS — C50419 Malignant neoplasm of upper-outer quadrant of unspecified female breast: Secondary | ICD-10-CM | POA: Diagnosis not present

## 2013-02-15 DIAGNOSIS — L299 Pruritus, unspecified: Secondary | ICD-10-CM | POA: Diagnosis not present

## 2013-02-15 DIAGNOSIS — C50412 Malignant neoplasm of upper-outer quadrant of left female breast: Secondary | ICD-10-CM

## 2013-02-15 DIAGNOSIS — Z510 Encounter for antineoplastic radiation therapy: Secondary | ICD-10-CM | POA: Diagnosis not present

## 2013-02-15 DIAGNOSIS — N6459 Other signs and symptoms in breast: Secondary | ICD-10-CM | POA: Diagnosis not present

## 2013-02-15 NOTE — Progress Notes (Signed)
Weekly Management Note:  Site: Left breast Current Dose:  3500  cGy Projected Dose: 4250  cGy, no boost  Narrative: The patient is seen today for routine under treatment assessment. CBCT/MVCT images/port films were reviewed. The chart was reviewed.   She still doing well although she does have slight pruritus along her breast. She is using Radioplex gel.  Physical Examination:  Filed Vitals:   02/15/13 0946  BP: 145/89  Pulse: 69  Temp: 98.2 F (36.8 C)  Resp: 20  .  Weight: 182 lb 3.2 oz (82.645 kg). There is erythema along the left breast with no areas of desquamation.  Impression: Tolerating radiation therapy well. She may use hydrocortisone cream when necessary for pruritus  Plan: Continue radiation therapy as planned.

## 2013-02-15 NOTE — Progress Notes (Signed)
Pt denies pain, loss of appetite. She is fatigued. Pt applying Radiaplex lotion to left breast treatment area. She has occasional itching. Advised she may apply Cortisone cream 1% prn for itching.

## 2013-02-16 ENCOUNTER — Ambulatory Visit: Payer: Medicare Other

## 2013-02-16 ENCOUNTER — Ambulatory Visit
Admission: RE | Admit: 2013-02-16 | Discharge: 2013-02-16 | Disposition: A | Discharge status: Home / Self Care | Payer: Medicare Other | Source: Ambulatory Visit | Attending: Radiation Oncology | Admitting: Radiation Oncology

## 2013-02-16 DIAGNOSIS — L299 Pruritus, unspecified: Secondary | ICD-10-CM | POA: Diagnosis not present

## 2013-02-16 DIAGNOSIS — Z510 Encounter for antineoplastic radiation therapy: Secondary | ICD-10-CM | POA: Diagnosis not present

## 2013-02-16 DIAGNOSIS — N6459 Other signs and symptoms in breast: Secondary | ICD-10-CM | POA: Diagnosis not present

## 2013-02-16 DIAGNOSIS — C50419 Malignant neoplasm of upper-outer quadrant of unspecified female breast: Secondary | ICD-10-CM | POA: Diagnosis not present

## 2013-02-17 ENCOUNTER — Ambulatory Visit
Admission: RE | Admit: 2013-02-17 | Discharge: 2013-02-17 | Disposition: A | Discharge status: Home / Self Care | Payer: Medicare Other | Source: Ambulatory Visit | Attending: Radiation Oncology | Admitting: Radiation Oncology

## 2013-02-17 ENCOUNTER — Ambulatory Visit: Payer: Medicare Other

## 2013-02-17 DIAGNOSIS — C50419 Malignant neoplasm of upper-outer quadrant of unspecified female breast: Secondary | ICD-10-CM | POA: Diagnosis not present

## 2013-02-17 DIAGNOSIS — N6459 Other signs and symptoms in breast: Secondary | ICD-10-CM | POA: Diagnosis not present

## 2013-02-17 DIAGNOSIS — L299 Pruritus, unspecified: Secondary | ICD-10-CM | POA: Diagnosis not present

## 2013-02-17 DIAGNOSIS — Z510 Encounter for antineoplastic radiation therapy: Secondary | ICD-10-CM | POA: Diagnosis not present

## 2013-02-18 ENCOUNTER — Ambulatory Visit (HOSPITAL_BASED_OUTPATIENT_CLINIC_OR_DEPARTMENT_OTHER): Payer: Medicare Other | Admitting: Oncology

## 2013-02-18 ENCOUNTER — Ambulatory Visit
Admission: RE | Admit: 2013-02-18 | Discharge: 2013-02-18 | Disposition: A | Discharge status: Home / Self Care | Payer: Medicare Other | Source: Ambulatory Visit | Attending: Radiation Oncology | Admitting: Radiation Oncology

## 2013-02-18 ENCOUNTER — Encounter: Payer: Self-pay | Admitting: Radiation Oncology

## 2013-02-18 VITALS — BP 146/77 | HR 69 | Temp 98.7°F | Resp 20 | Wt 180.7 lb

## 2013-02-18 VITALS — BP 158/79 | HR 75 | Temp 98.2°F | Resp 20 | Ht 61.0 in | Wt 182.0 lb

## 2013-02-18 DIAGNOSIS — C50412 Malignant neoplasm of upper-outer quadrant of left female breast: Secondary | ICD-10-CM

## 2013-02-18 DIAGNOSIS — C50419 Malignant neoplasm of upper-outer quadrant of unspecified female breast: Secondary | ICD-10-CM | POA: Diagnosis not present

## 2013-02-18 DIAGNOSIS — M899 Disorder of bone, unspecified: Secondary | ICD-10-CM | POA: Diagnosis not present

## 2013-02-18 DIAGNOSIS — L299 Pruritus, unspecified: Secondary | ICD-10-CM | POA: Diagnosis not present

## 2013-02-18 DIAGNOSIS — Z510 Encounter for antineoplastic radiation therapy: Secondary | ICD-10-CM | POA: Diagnosis not present

## 2013-02-18 DIAGNOSIS — N6459 Other signs and symptoms in breast: Secondary | ICD-10-CM | POA: Diagnosis not present

## 2013-02-18 DIAGNOSIS — M8580 Other specified disorders of bone density and structure, unspecified site: Secondary | ICD-10-CM

## 2013-02-18 MED ORDER — LETROZOLE 2.5 MG PO TABS: 2.5000 mg | ORAL_TABLET | Freq: Every day | ORAL | Status: DC

## 2013-02-18 NOTE — Progress Notes (Signed)
[facility] Radiation Oncology End of Treatment Note  Name:Nariah ZYNIAH FERRAIOLO  Date: 02/18/2013 ZOX:[id number] DOB:Mar 17, 1946   Status:outpatient    CC: [name], MD  Dr. Claud Kelp  REFERRING PHYSICIAN: Dr. Claud Kelp   DIAGNOSIS:  Pathologic stage I (T1, N0, M0) invasive lobular carcinoma of the left breast, initial clinical stage IIA (T2 N0 M0)  INDICATION FOR TREATMENT: Curative   TREATMENT DATES: 01/26/2013 through 02/18/2013                          SITE/DOSE: Left breast at 4250 cGy 17 sessions                            BEAMS/ENERGY:   Mixed 6 MV/18 MV photons, tangential fields with deep inspiration/breath-hold to avoid the cardiac silhouette.                NARRATIVE: Ms. Cara tolerated treatment well with the expected degree of radiation dermatitis by completion of therapy. She had dry desquamation along the inframammary region but no areas of moist desquamation. She was treated with Radioplex gel.                           PLAN: Routine followup in one month. Patient instructed to call if questions or worsening complaints in interim.

## 2013-02-18 NOTE — Progress Notes (Signed)
Pt completed treatment today to left breast. She is advised to continue to apply lotion to left breast. Pt denies loss of appetite, pain but states her left breast is "tender".  Reminded pt she may apply Cortisone cream prn for pruritis on upper left breast. Gave pt FU card.

## 2013-02-18 NOTE — Progress Notes (Signed)
Weekly Management Note:  Site: Left breast Current Dose:  4250  cGy Projected Dose: 4250  cGy  Narrative: The patient is seen today for routine under treatment assessment. CBCT/MVCT images/port films were reviewed. The chart was reviewed.   She is without new complaints today. Her left breast is tender as expected. She does have some pruritus for which we recommend that she use hydrocortisone cream when necessary. Otherwise, she has Radioplex gel.  Physical Examination:  Filed Vitals:   02/18/13 0905  BP: 146/77  Pulse: 69  Temp: 98.7 F (37.1 C)  Resp: 20  .  Weight: 180 lb 11.2 oz (81.965 kg). There is erythema along the entire left breast with slight edema. The left nipple is inverted. Patchy dry desquamation along the inframammary region but no areas of moist desquamation.  Impression: Radiation therapy completed.  Plan: Followup visit one month.

## 2013-02-21 NOTE — Progress Notes (Signed)
OFFICE PROGRESS NOTE  CC Dr. [name] Dr. Claud Kelp Dr. Chipper Herb Dr. Huel Cote  DIAGNOSIS: 67 year old female with invasive lobular carcinoma of the left breast in the upper-outer quadrant stage IIA.  PRIOR THERAPY:  #1 patient originally presented to the multidisciplinary breast clinic in September 2013 with new diagnosis of left invasive lobular carcinoma by ultrasound measuring 1.6 and by MRI measuring 3 cm. She had undergone a needle core biopsy the tumor was lobular ER positive.  #2 she was seen by Dr. Dayton Scrape as well as Dr. Claud Kelp patient was interested in breast conservation. We recommended neoadjuvant antiestrogen therapy to try to reduce the size of the tumor for a good cosmetic result at this time of lumpectomy. She went on to receive letrozole 2.5 mg daily starting in 02/05/2012 through June 2014  #3 status post partial mastectomy with sentinel lymph node biopsy of the left breast. Final pathology did reveal residual disease measuring at T1 C. N0 ER positive PR negative HER-2/neu negative. Anterior margin was positive she is reexcised.   #4 patient completed radiation therapy to the left breast on 02/18/2013.  #4 began letrozole 2.5 mg started 02/18/2013 for a total of 5 years  CURRENT THERAPY: letrozole 2.5 mg daily  INTERVAL HISTORY: Elizabeth Hebert 67 y.o. female returns for followup visit.  She looks good her lumpectomy incision is healing well. She has no nausea vomiting fevers chills night sweats headaches shortness of breath chest pains or palpitations. Remainder of the 10 point review of systems is negative.  MEDICAL HISTORY: Past Medical History  Diagnosis Date  . Colitis   . Diverticulitis   . Fibroid tumor   . Anemia   . Broken arm 2011  . Hx of hernia repair   . History of cataract surgery 2012  . Anginal pain     infreq  . Hypertension   . Myocardial infarction     stents x 2 when had mi 99  . Depression   .  Diabetes mellitus without complication   . Heart murmur   . GERD (gastroesophageal reflux disease)   . H/O hiatal hernia   . Cancer     breaast  . Arthritis     ALLERGIES:  is allergic to vasotec.  MEDICATIONS:  Current Outpatient Prescriptions  Medication Sig Dispense Refill  . amLODipine-olmesartan (AZOR) 5-40 MG per tablet Take 1 tablet by mouth daily.      Marland Kitchen ascorbic acid (VITAMIN C) 1000 MG tablet Take 1,000 mg by mouth daily.      Marland Kitchen aspirin 81 MG chewable tablet Chew 81 mg by mouth daily.      Marland Kitchen CALCIUM-MAGNESIUM-VITAMIN D PO Take 370 mg by mouth daily.      . cimetidine (TAGAMET) 200 MG tablet Take 200 mg by mouth 2 (two) times daily.      . Cranberry 500 MG CAPS Take 1 capsule by mouth daily.      . digoxin (LANOXIN) 0.125 MG tablet Take 0.125 mg by mouth daily.      . Exenatide ER (BYDUREON) 2 MG SUSR Inject 2 mg into the skin once a week.       . ezetimibe (ZETIA) 10 MG tablet Take 10 mg by mouth daily.      . ferrous sulfate 325 (65 FE) MG tablet Take 325 mg by mouth daily with breakfast.      . glimepiride (AMARYL) 4 MG tablet Take 4 mg by mouth daily before breakfast.      .  hyaluronate sodium (RADIAPLEXRX) GEL Apply topically 2 (two) times daily.      Marland Kitchen HYDROcodone-acetaminophen (NORCO/VICODIN) 5-325 MG per tablet       . letrozole (FEMARA) 2.5 MG tablet Take 1 tablet (2.5 mg total) by mouth daily.  90 tablet  6  . metFORMIN (GLUCOPHAGE) 1000 MG tablet Take 1,000 mg by mouth 2 (two) times daily with a meal.      . metoprolol succinate (TOPROL-XL) 50 MG 24 hr tablet Take 50 mg by mouth daily. Take with or immediately following a meal.      . Multiple Vitamin (MULTIVITAMIN) capsule Take 1 capsule by mouth daily.      . nitrofurantoin, macrocrystal-monohydrate, (MACROBID) 100 MG capsule       . nitroGLYCERIN (NITROSTAT) 0.4 MG SL tablet Place 0.4 mg under the tongue every 5 (five) minutes as needed for chest pain.       . non-metallic deodorant Thornton Papas) MISC Apply 1  application topically daily as needed.      . rosuvastatin (CRESTOR) 20 MG tablet Take 20 mg by mouth daily.      . sertraline (ZOLOFT) 50 MG tablet Take 50 mg by mouth daily.      . sitaGLIPtin (JANUVIA) 100 MG tablet Take 100 mg by mouth daily.       No current facility-administered medications for this visit.    SURGICAL HISTORY:  Past Surgical History  Procedure Laterality Date  . Cholecystectomy    . Colon resection    . Total abdominal hysterectomy  2001  . Eye surgery Bilateral     cat   . Hernia repair      rt  . Appendectomy    . Partial mastectomy with needle localization and axillary sentinel lymph node bx Left 12/08/2012    Procedure: LEFT PARTIAL MASTECTOMY WITH NEEDLE LOCALIZATION AND AXILLARY SENTINEL LYMPH NODE BIOPSIES TIMES FOUR;  Surgeon: Ernestene Mention, MD;  Location: [facility] OR;  Service: General;  Laterality: Left;  Marland Kitchen Mastectomy, partial Left 12/25/2012    Procedure: MASTECTOMY PARTIAL with re-excision of margins;  Surgeon: Ernestene Mention, MD;  Location: [facility];  Service: General;  Laterality: Left;    REVIEW OF SYSTEMS:  Pertinent items are noted in HPI.   HEALTH MAINTENANCE:  PHYSICAL EXAMINATION: Blood pressure 158/79, pulse 75, temperature 98.2 F (36.8 C), temperature source Oral, resp. rate 20, height 5\' 1"  (1.549 m), weight 182 lb (82.555 kg). Body mass index is 34.41 kg/(m^2). ECOG PERFORMANCE STATUS: 0 - Asymptomatic   General appearance: alert, cooperative and appears stated age Neck: no adenopathy, no carotid bruit, no JVD, supple, symmetrical, trachea midline and thyroid not enlarged, symmetric, no tenderness/mass/nodules Lymph nodes: Cervical, supraclavicular, and axillary nodes normal. Resp: clear to auscultation bilaterally Back: symmetric, no curvature. ROM normal. No CVA tenderness. Cardio: regular rate and rhythm GI: soft, non-tender; bowel sounds normal; no masses,  no organomegaly Extremities: extremities normal,  atraumatic, no cyanosis or edema Neurologic: Grossly normal Left breast examination no skin nodularity I am barely able to palpate the mass there is no nipple discharge or retraction or inversion. Right breast no masses or nipple discharge.  LABORATORY DATA: Lab Results  Component Value Date   WBC 11.2* 12/07/2012   HGB 13.8 12/25/2012   HCT 40.0 12/07/2012   MCV 81.8 12/07/2012   PLT 303 12/07/2012      Chemistry      Component Value Date/Time   NA 137 12/07/2012 0927   NA 141 10/20/2012 0816  K 3.7 12/07/2012 0927   K 4.1 10/20/2012 0816   CL 100 12/07/2012 0927   CL 102 10/20/2012 0816   CO2 24 12/07/2012 0927   CO2 28 10/20/2012 0816   BUN 8 12/07/2012 0927   BUN 4.9* 10/20/2012 0816   CREATININE 0.67 12/07/2012 0927   CREATININE 0.8 10/20/2012 0816      Component Value Date/Time   CALCIUM 9.7 12/07/2012 0927   CALCIUM 9.8 10/20/2012 0816   ALKPHOS 67 12/07/2012 0927   ALKPHOS 65 10/20/2012 0816   AST 38* 12/07/2012 0927   AST 39* 10/20/2012 0816   ALT 30 12/07/2012 0927   ALT 43 10/20/2012 0816   BILITOT 0.3 12/07/2012 0927   BILITOT 0.54 10/20/2012 0816      RADIOGRAPHIC STUDIES:  BILATERAL BREAST MRI WITH AND WITHOUT CONTRAST  Technique: Multiplanar, multisequence MR images of both breasts  were obtained prior to and following the intravenous administration  of 17ml of Multihance. Three dimensional images were evaluated at  the independent DynaCad workstation.  Comparison: 01/31/2012 breast MRI.  Findings: There is minimal bilateral parenchymal background  enhancement. The previously seen 3.0 x 1.9 x 1.4 cm irregular mass-  like enhancement located within the left upper-outer quadrant has  markedly decreased in enhancement and has decreased in size now  measuring 1.9 x 1.0 x 1.0 cm in size. There are no other areas of  worrisome enhancement within either breast. The previously  discussed left axillary lymph nodes appear stable. There is no  evidence for axillary adenopathy or internal  mammary adenopathy.  There are no additional findings.  IMPRESSION:  Interval decrease in size and enhancement associated with the left  breast mass located within the upper-outer quadrant now measuring  1.9 x 1.0 x 1.0 cm in size. No additional findings.  RECOMMENDATION:  Treatment plan.   ASSESSMENT: 67 year old female with  #1 stage IIa invasive lobular carcinoma measuring 3.0 cm of the left breast status post needle core biopsy performed in August 2013. Patient is now on neoadjuvant antiestrogen therapy with letrozole 2.5 mg daily. She seems to be tolerating it very well.  #2 . MRI of the breast does reveal some enlargement of the disease. Her MRI performed in February had shown the disease to be 1.9 cm. Now in June MRI it is 2.1 cm.  #3 patient is status post left partial mastectomy and sentinel lymph node biopsy after receiving 9 months of neoadjuvant letrozole. Her final pathology revealed invasive lobular carcinoma, grade 2, Elige Radon present less than 1 mm to the anterior margin 004 sentinel nodes positive. Pathologic stage TI C. N0 ER +70% PR negative HER-2/neu negative. Patient's anterior margin was close and she has had a reexcision. This was performed on 12/25/2012. It showed no residual malignancy.  #4 patient has now completed her radiation therapy to the breast on 02/18/2013. She tolerated it well.  #5 patient will begin letrozole 2.5 mg daily. We've discussed the risks benefits and side effects of the medication.  PLAN:  #1proceed with letrozole 2.5 mg daily.  #2 I will see her back in 3 months time for follow  All questions were answered. The patient knows to call the clinic with any problems, questions or concerns. We can certainly see the patient much sooner if necessary.  I spent 25 minutes counseling the patient face to face. The total time spent in the appointment was 30 minutes.    Drue Second, MD Medical/Oncology [facility] [phone]  (beeper) [phone] (Office)

## 2013-03-02 DIAGNOSIS — E7800 Pure hypercholesterolemia, unspecified: Secondary | ICD-10-CM | POA: Diagnosis not present

## 2013-03-02 DIAGNOSIS — E669 Obesity, unspecified: Secondary | ICD-10-CM | POA: Diagnosis not present

## 2013-03-02 DIAGNOSIS — E1139 Type 2 diabetes mellitus with other diabetic ophthalmic complication: Secondary | ICD-10-CM | POA: Diagnosis not present

## 2013-03-02 DIAGNOSIS — Z0000 Encounter for general adult medical examination without abnormal findings: Secondary | ICD-10-CM | POA: Diagnosis not present

## 2013-03-02 DIAGNOSIS — I119 Hypertensive heart disease without heart failure: Secondary | ICD-10-CM | POA: Diagnosis not present

## 2013-03-02 DIAGNOSIS — C50919 Malignant neoplasm of unspecified site of unspecified female breast: Secondary | ICD-10-CM | POA: Diagnosis not present

## 2013-03-02 DIAGNOSIS — E11359 Type 2 diabetes mellitus with proliferative diabetic retinopathy without macular edema: Secondary | ICD-10-CM | POA: Diagnosis not present

## 2013-03-02 DIAGNOSIS — F325 Major depressive disorder, single episode, in full remission: Secondary | ICD-10-CM | POA: Diagnosis not present

## 2013-03-02 DIAGNOSIS — Z6834 Body mass index (BMI) 34.0-34.9, adult: Secondary | ICD-10-CM | POA: Diagnosis not present

## 2013-03-05 ENCOUNTER — Telehealth: Payer: Self-pay | Admitting: *Deleted

## 2013-03-05 ENCOUNTER — Encounter (INDEPENDENT_AMBULATORY_CARE_PROVIDER_SITE_OTHER): Payer: Self-pay | Admitting: General Surgery

## 2013-03-05 ENCOUNTER — Ambulatory Visit (INDEPENDENT_AMBULATORY_CARE_PROVIDER_SITE_OTHER): Payer: Medicare Other | Admitting: General Surgery

## 2013-03-05 VITALS — BP 124/70 | HR 84 | Temp 98.3°F | Resp 14 | Ht 61.0 in | Wt 180.6 lb

## 2013-03-05 DIAGNOSIS — C50419 Malignant neoplasm of upper-outer quadrant of unspecified female breast: Secondary | ICD-10-CM

## 2013-03-05 DIAGNOSIS — C50412 Malignant neoplasm of upper-outer quadrant of left female breast: Secondary | ICD-10-CM

## 2013-03-05 DIAGNOSIS — IMO0002 Reserved for concepts with insufficient information to code with codable children: Secondary | ICD-10-CM

## 2013-03-05 NOTE — Telephone Encounter (Signed)
It sounds like these changes are most likely due to the radiation therapy that which is completed. I will be happy to see her if there is a surgical problem, however.  H.Monike Bragdon

## 2013-03-05 NOTE — Patient Instructions (Signed)
You had a large fluid collection in your left breast(480 cc)  which we removed today. It does not look infected, but we sent some fluid for culture, and you will be started on antibiotic called doxycycline.  Return to see Dr. Derrell Lolling in 3 weeks.  Call for a sooner appointment if the fluid re- accumulates more quickly.

## 2013-03-05 NOTE — Progress Notes (Signed)
Patient ID: Elizabeth Hebert, female   DOB: 1946/06/02, 67 y.o.   MRN: [id number] History: This patient called to be seen today because of progressive redness and swelling in her left breast. She was initially diagnosed with invasive cancer of the left breast in the upper-outer quadrant and underwent image guided biopsy which showed invasive cancer, lobular phenotype, receptor positive, HER-2-negative. This looked to be 3 cm on MRI.she underwent left partial mastectomy and sentinel no biopsy and subsequent reexcision of margins on 12/25/2012 following 9 months of neoadjuvant letrozole. She completed her radiation therapy on September 18. She presents today with a five-day history of progressive swelling in the left breast. The breast has been read from her radiation therapy but the swelling has only been going on for 5 days. No fever or chills  Exam: The patient looks well. No distress. Left breast is diffusely erythematous, probably radiation change. She does have what appears to be a large fluid collection which was confirmed by ultrasound. Following alcohol prep and local anesthesia I aspirated 480 cc of clear yellow fluid. This completely evacuated the seroma. This was sent for culture. She tolerated this well.  Assessment: Large seroma left breast, upper outer quadrant. Presenting 2 weeks following completion of radiation therapy Invasive lobular carcinoma left breast, upper outer quadrant, ER positive, PR negative, HER-2-negative. Stage ypT1c, ypN0.  Status post neoadjuvant antiestrogen therapy with good response.  Status post left partial mastectomy with needle localization, sentinel node biopsy, and subsequent reexcision of anterior margin with clear margins. Recovering uneventfully  Plan: Doxycycline 100 mg by mouth twice a day x7 days Fluid sent for culture and sensitivity Return to see me in 3 weeks, sooner if the fluid re accumulates.   [name]. Derrell Lolling, M.D., [facility] General and Minimally invasive Surgery Breast and Colorectal Surgery Office:   [phone] Pager:   [phone]

## 2013-03-05 NOTE — Telephone Encounter (Signed)
Returned vm from pt who states she completed radiation treatment to her left breast on 02/18/13. She states in the past 5 days she has noticed her left breast is "more swollen and red, w/warmth". She states "It is sore." Pt denies fever. Informed her Dr Dayton Scrape is out of office today, but can discuss her concerns w/Dr Mitzi Hansen, on call dr. Also advised she call Dr Derrell Lolling, her surgeon and request to be seen today. Pt states she will call Dr Jacinto Halim office. Encouraged pt to call this office back today as needed. Pt verbalized understanding. Will call pt back today to FU.  11:17 am Spoke w/pt who states she called Dr Jacinto Halim office and has appointment with him today at 3 pm. Advised she call this office is there is anything else we can do to help her. Pt verbalized understanding.

## 2013-03-08 ENCOUNTER — Telehealth: Payer: Self-pay | Admitting: *Deleted

## 2013-03-08 LAB — WOUND CULTURE
Gram Stain: NONE SEEN
Gram Stain: NONE SEEN
Gram Stain: NONE SEEN
Organism ID, Bacteria: NO GROWTH

## 2013-03-08 NOTE — Telephone Encounter (Signed)
Called pt to FU w/last Friday's call. Pt states her left breast is less swollen, still slightly warm to touch and somewhat red. She states "It feels better." Pt is taking Doxycycline twice daily.

## 2013-03-12 ENCOUNTER — Telehealth: Payer: Self-pay | Admitting: *Deleted

## 2013-03-12 NOTE — Telephone Encounter (Signed)
Left vm re: pt's progress since beginning antibiotics on 03/08/13. Left call back name and number.

## 2013-03-15 ENCOUNTER — Ambulatory Visit (INDEPENDENT_AMBULATORY_CARE_PROVIDER_SITE_OTHER): Payer: Medicare Other | Admitting: General Surgery

## 2013-03-15 ENCOUNTER — Encounter (INDEPENDENT_AMBULATORY_CARE_PROVIDER_SITE_OTHER): Payer: Self-pay | Admitting: General Surgery

## 2013-03-15 ENCOUNTER — Telehealth (INDEPENDENT_AMBULATORY_CARE_PROVIDER_SITE_OTHER): Payer: Self-pay

## 2013-03-15 VITALS — BP 114/78 | HR 81 | Temp 96.7°F | Ht 61.0 in | Wt 178.8 lb

## 2013-03-15 DIAGNOSIS — Z5189 Encounter for other specified aftercare: Secondary | ICD-10-CM

## 2013-03-15 DIAGNOSIS — T792XXD Traumatic secondary and recurrent hemorrhage and seroma, subsequent encounter: Secondary | ICD-10-CM

## 2013-03-15 DIAGNOSIS — IMO0002 Reserved for concepts with insufficient information to code with codable children: Secondary | ICD-10-CM | POA: Insufficient documentation

## 2013-03-15 NOTE — Progress Notes (Signed)
The patient comes in today with recurrent seroma of her left breast. The last time she saw her primary surgeon for 180 cc of clear serous fluid was drained. Cultures on that were negative.  Today the patient has a bulging tense seroma in the upper outer portion of her left breast. After Betadine prep and with lidocaine local anesthetic 240 cc were drained from her left breast. The patient had immediate relief. She has an appointment to see her primary surgeon within the next 2 weeks. She is to keep that appointment. In the meantime if she should have a reaccumulation of fluid that she may need another urgent office visit for drainage.  Her breast does not appear to be infected and I do not believe the patient requires antibiotics.

## 2013-03-15 NOTE — Telephone Encounter (Signed)
Patient calling into office this morning requesting an appointment for evaluation of partial mastectomy site.  Patient s/p Breast Partial mastectomy on December 25, 2012.  Patient reports the area is red, swollen and warm to touch.  Patient denies having any fevers.  Patient has follow up appointment with Dr. Derrell Lolling on 03/29/13.  Patient given appointment for further evaluation in Urgent Office today @ 3:15 pm w/Dr. Lindie Spruce.

## 2013-03-17 ENCOUNTER — Encounter: Payer: Self-pay | Admitting: *Deleted

## 2013-03-22 ENCOUNTER — Telehealth: Payer: Self-pay | Admitting: Oncology

## 2013-03-22 NOTE — Telephone Encounter (Signed)
Faxed pt medical records to [employer] °

## 2013-03-23 ENCOUNTER — Ambulatory Visit
Admission: RE | Admit: 2013-03-23 | Discharge: 2013-03-23 | Disposition: A | Discharge status: Home / Self Care | Payer: Medicare Other | Source: Ambulatory Visit | Attending: Radiation Oncology | Admitting: Radiation Oncology

## 2013-03-23 ENCOUNTER — Encounter: Payer: Self-pay | Admitting: Radiation Oncology

## 2013-03-23 VITALS — BP 135/79 | HR 83 | Temp 98.7°F | Resp 20 | Wt 179.0 lb

## 2013-03-23 DIAGNOSIS — C50412 Malignant neoplasm of upper-outer quadrant of left female breast: Secondary | ICD-10-CM

## 2013-03-23 NOTE — Progress Notes (Signed)
[facility] Radiation Oncology Follow up Note  Name: Elizabeth Hebert   Date:   03/23/2013 MRN:  [id number] DOB: 11/03/1945    DIAGNOSIS: Pathologic stage I (T1, N0, M0) invasive lobular carcinoma of the left breast, initial clinical stage IIA (T2 N0 M0)    INTERVAL SINCE LAST RADIATION: 1 month    ALLERGIES: Vasotec   MEDICATIONS:  Current Outpatient Prescriptions  Medication Sig Dispense Refill  . amLODipine-olmesartan (AZOR) 5-40 MG per tablet Take 1 tablet by mouth daily.      Marland Kitchen ascorbic acid (VITAMIN C) 1000 MG tablet Take 1,000 mg by mouth daily.      Marland Kitchen aspirin 81 MG chewable tablet Chew 81 mg by mouth daily.      Marland Kitchen CALCIUM-MAGNESIUM-VITAMIN D PO Take 370 mg by mouth daily.      . cimetidine (TAGAMET) 200 MG tablet Take 200 mg by mouth 2 (two) times daily.      . Cranberry 500 MG CAPS Take 1 capsule by mouth daily.      . digoxin (LANOXIN) 0.125 MG tablet Take 0.125 mg by mouth daily.      . Exenatide ER (BYDUREON) 2 MG SUSR Inject 2 mg into the skin once a week.       . ezetimibe (ZETIA) 10 MG tablet Take 10 mg by mouth daily.      . ferrous sulfate 325 (65 FE) MG tablet Take 325 mg by mouth daily with breakfast.      . glimepiride (AMARYL) 4 MG tablet Take 4 mg by mouth daily before breakfast.      . HYDROcodone-acetaminophen (NORCO/VICODIN) 5-325 MG per tablet       . letrozole (FEMARA) 2.5 MG tablet Take 1 tablet (2.5 mg total) by mouth daily.  90 tablet  6  . metFORMIN (GLUCOPHAGE) 1000 MG tablet Take 1,000 mg by mouth 2 (two) times daily with a meal.      . metoprolol succinate (TOPROL-XL) 50 MG 24 hr tablet Take 50 mg by mouth daily. Take with or immediately following a meal.      . Multiple Vitamin (MULTIVITAMIN) capsule Take 1 capsule by mouth daily.      . nitrofurantoin, macrocrystal-monohydrate, (MACROBID) 100 MG capsule       . nitroGLYCERIN (NITROSTAT) 0.4 MG SL tablet Place 0.4 mg under the tongue every 5 (five) minutes as needed for chest  pain.       . non-metallic deodorant Thornton Papas) MISC Apply 1 application topically daily as needed.      . rosuvastatin (CRESTOR) 20 MG tablet Take 20 mg by mouth daily.      . sertraline (ZOLOFT) 50 MG tablet Take 50 mg by mouth daily.      . sitaGLIPtin (JANUVIA) 100 MG tablet Take 100 mg by mouth daily.       No current facility-administered medications for this encounter.     NARRATIVE: Elizabeth Hebert returns today approximately 1 month following completion of radiation therapy following conservative surgery. She is back to Dr. Derrell Lolling on 2 occasions for aspiration of a left breast upper outer quadrant seroma. She had 180 cc of clear serous fluid drained on 1 occasion and 240 cc drained on a separate occasion. She sees Dr. Derrell Lolling back for a followup visit, and perhaps repeat aspiration on October 27. She sees Dr. Welton Flakes for followup in March of 2015. She continues with Femara.   PHYSICAL EXAM:   weight is 179 lb (81.194 kg). Her temperature is 98.7  F (37.1 C). Her blood pressure is 135/79 and her pulse is 83. Her respiration is 20.  Alert and oriented. Nodes: There is no palpable cervical, supraclavicular, or axillary lymphadenopathy. Breasts: There is a 7-8 cm seroma along the upper outer quadrant of the left breast. No other masses appreciated. Right breast without masses or lesions.  LABORATORY DATA:  Lab Results  Component Value Date   WBC 11.2* 12/07/2012   HGB 13.8 12/25/2012   HCT 40.0 12/07/2012   MCV 81.8 12/07/2012   PLT 303 12/07/2012   Lab Results  Component Value Date   NA 137 12/07/2012   K 3.7 12/07/2012   CL 100 12/07/2012   CO2 24 12/07/2012   Lab Results  Component Value Date   ALT 30 12/07/2012   AST 38* 12/07/2012   ALKPHOS 67 12/07/2012   BILITOT 0.3 12/07/2012       IMPRESSION: No evidence for recurrent disease, but she does have an organizing seroma which did develop following her radiation therapy. This is quite unusual. I do with Dr. Derrell Lolling that she should be treated  conservatively. We should try to avoid a drain which could introduce risk for infection.    PLAN: She may require further needle aspirations of her seroma which will hopefully stabilize. She'll maintain her followup with Dr. Welton Flakes is well. She'll continue with her Femara.

## 2013-03-23 NOTE — Progress Notes (Signed)
Pt has had her left breast drained x 2 per Dr Jacinto Halim office. She has FU w/Dr Derrell Lolling on 03/29/13. She states her left breast "is swollen again in the upper outer region". Her skin has healed, area slightly pink in upper outer breast. Pt taking Femara.

## 2013-03-29 ENCOUNTER — Ambulatory Visit (INDEPENDENT_AMBULATORY_CARE_PROVIDER_SITE_OTHER): Payer: Medicare Other | Admitting: General Surgery

## 2013-03-29 ENCOUNTER — Encounter (INDEPENDENT_AMBULATORY_CARE_PROVIDER_SITE_OTHER): Payer: Self-pay | Admitting: General Surgery

## 2013-03-29 VITALS — BP 140/78 | HR 84 | Temp 97.9°F | Resp 14 | Ht 61.0 in | Wt 178.6 lb

## 2013-03-29 DIAGNOSIS — C50419 Malignant neoplasm of upper-outer quadrant of unspecified female breast: Secondary | ICD-10-CM

## 2013-03-29 DIAGNOSIS — Z5189 Encounter for other specified aftercare: Secondary | ICD-10-CM

## 2013-03-29 DIAGNOSIS — T792XXD Traumatic secondary and recurrent hemorrhage and seroma, subsequent encounter: Secondary | ICD-10-CM

## 2013-03-29 DIAGNOSIS — C50412 Malignant neoplasm of upper-outer quadrant of left female breast: Secondary | ICD-10-CM

## 2013-03-29 NOTE — Patient Instructions (Signed)
We aspirated 160 cc from the lumpectomy cavity today. This is not infected. It seems less than last time. We may or may not have to place a drain.  Return to see Dr. Derrell Lolling in 2 weeks.

## 2013-03-29 NOTE — Progress Notes (Signed)
Patient ID: Elizabeth Hebert, female   DOB: [birth year], 67 y.o.   MRN: [id number] History:  She returns to reassess the seroma in her left breast. I initially aspirated 480 cc. Arm October 13 Dr. Jeannie Fend. Aspirated 240 cc. She thinks the fluid has come back. No sign of infection. Cultures were negative.  She was initially diagnosed with invasive cancer of the left breast in the upper-outer quadrant and underwent image guided biopsy which showed invasive cancer, lobular phenotype, receptor positive, HER-2-negative. This looked to be 3 cm on MRI.she underwent left partial mastectomy and sentinel no biopsy and subsequent reexcision of margins on 12/25/2012 following 9 months of neoadjuvant letrozole.  She completed her radiation therapy on September 18.    Exam:  The patient looks well. No distress.  Left breast Reveals that all the erythema has resolved. The lumpectomy cavity feels full but nontender.. She does have what appears to be a large fluid collection which was confirmed by ultrasound. Following alcohol prep and local anesthesia I aspirated 160 cc of clear yellow fluid. This completely evacuated the seroma. She tolerated this well.   Assessment:  Large seroma left breast, upper outer quadrant.This developed after radiation therapy. Invasive lobular carcinoma left breast, upper outer quadrant, ER positive, PR negative, HER-2-negative. Stage ypT1c, ypN0.  Status post neoadjuvant antiestrogen therapy with good response.  Status post left partial mastectomy with needle localization, sentinel node biopsy, and subsequent reexcision of anterior margin with clear margins. Recovering uneventfully   Plan:  Return to see me in 2 weeks, sooner if the fluid re accumulates. She may need a seroma drain.   [name]. Derrell Lolling, M.D., [facility] General and Minimally invasive Surgery Breast and Colorectal Surgery Office:   [phone] Pager:   [phone]

## 2013-04-06 DIAGNOSIS — N3000 Acute cystitis without hematuria: Secondary | ICD-10-CM | POA: Diagnosis not present

## 2013-04-06 DIAGNOSIS — R350 Frequency of micturition: Secondary | ICD-10-CM | POA: Diagnosis not present

## 2013-04-08 ENCOUNTER — Other Ambulatory Visit: Payer: Self-pay

## 2013-04-15 ENCOUNTER — Ambulatory Visit (INDEPENDENT_AMBULATORY_CARE_PROVIDER_SITE_OTHER): Payer: Medicare Other | Admitting: General Surgery

## 2013-04-15 ENCOUNTER — Encounter (INDEPENDENT_AMBULATORY_CARE_PROVIDER_SITE_OTHER): Payer: Self-pay | Admitting: General Surgery

## 2013-04-15 VITALS — BP 140/90 | HR 76 | Temp 98.7°F | Resp 14 | Ht 61.0 in | Wt 180.0 lb

## 2013-04-15 DIAGNOSIS — C50419 Malignant neoplasm of upper-outer quadrant of unspecified female breast: Secondary | ICD-10-CM | POA: Diagnosis not present

## 2013-04-15 DIAGNOSIS — Z5189 Encounter for other specified aftercare: Secondary | ICD-10-CM

## 2013-04-15 DIAGNOSIS — C50412 Malignant neoplasm of upper-outer quadrant of left female breast: Secondary | ICD-10-CM

## 2013-04-15 DIAGNOSIS — T792XXD Traumatic secondary and recurrent hemorrhage and seroma, subsequent encounter: Secondary | ICD-10-CM

## 2013-04-15 NOTE — Progress Notes (Signed)
Patient ID: Elizabeth Hebert, female   DOB: Oct 30, 1945, 67 y.o.   MRN: [id number] History:  She returns to reassess the seroma in her left breast. I initially aspirated 480 cc. on October 13 I aspirated 240 cc. She thinks the fluid has come back. No sign of infection. Cultures were negative.   She was initially diagnosed with invasive cancer of the left breast in the upper-outer quadrant and underwent image guided biopsy which showed invasive cancer, lobular phenotype, receptor positive, HER-2-negative. This looked to be 3 cm on MRI.she underwent left partial mastectomy and sentinel no biopsy and subsequent reexcision of margins on 12/25/2012 following 9 months of neoadjuvant letrozole.  She completed her radiation therapy on September 18.   Exam:  The patient looks well. No distress.  Left breast Reveals that all the erythema has resolved. The lumpectomy cavity feels full but nontender.. She does have what appears to be a large fluid collection which was confirmed by ultrasound. The left breast was prepped and draped with ChloraPrep. 1% Xylocaine with epinephrine local. I put a seroma catheter in this and sutured it  to the skin with nylon sutures and connected to a suction bulb. Sterile bandages were placed. She tolerated this well. .  Assessment:  Large seroma left breast, upper outer quadrant.This developed after radiation therapy.  Invasive lobular carcinoma left breast, upper outer quadrant, ER positive, PR negative, HER-2-negative. Stage ypT1c, ypN0.  Status post neoadjuvant antiestrogen therapy with good response.  Status post left partial mastectomy with needle localization, sentinel node biopsy, and subsequent reexcision of anterior margin with clear margins. Recovering uneventfully   Plan:  Seroma drain placed today Wound and drain care discussed Return in one week   Angelee Bahr M. Derrell Lolling, M.D., [facility] General and Minimally invasive Surgery Breast and  Colorectal Surgery Office:   [phone] Pager:   [phone]

## 2013-04-15 NOTE — Patient Instructions (Signed)
Put some antibiotic ointment on the skin where the catheter enters the skin once a day. Keep a dry bandage on top.  Keep the wound dry. You will need to sponge bathe  Keep a written record of the drainage and bring that record with you to the office next time.  Call if the drainage stops.

## 2013-04-23 ENCOUNTER — Encounter (INDEPENDENT_AMBULATORY_CARE_PROVIDER_SITE_OTHER): Payer: Self-pay | Admitting: General Surgery

## 2013-04-23 ENCOUNTER — Ambulatory Visit (INDEPENDENT_AMBULATORY_CARE_PROVIDER_SITE_OTHER): Payer: Medicare Other | Admitting: General Surgery

## 2013-04-23 VITALS — BP 157/104 | HR 80 | Temp 97.9°F | Resp 16 | Ht 61.0 in | Wt 179.0 lb

## 2013-04-23 DIAGNOSIS — Z5189 Encounter for other specified aftercare: Secondary | ICD-10-CM

## 2013-04-23 DIAGNOSIS — C50412 Malignant neoplasm of upper-outer quadrant of left female breast: Secondary | ICD-10-CM

## 2013-04-23 DIAGNOSIS — C50419 Malignant neoplasm of upper-outer quadrant of unspecified female breast: Secondary | ICD-10-CM

## 2013-04-23 DIAGNOSIS — T792XXD Traumatic secondary and recurrent hemorrhage and seroma, subsequent encounter: Secondary | ICD-10-CM

## 2013-04-23 NOTE — Patient Instructions (Signed)
The drain was removed today.  You may shower tomorrow  Limited activity for a couple of days and put an ice pack on the wound intermittently for 24-48 hours  Return to see Dr. Derrell Lolling in 3 months, sooner if there are any problems.

## 2013-04-23 NOTE — Progress Notes (Signed)
Patient ID: Elizabeth Hebert, female   DOB: 10/31/1945, 67 y.o.   MRN: [id number]  History:  A placed a seroma drained in her left breast on 04/15/2013. This has worked well. The drainage is down to about 5-7 cc per day. She does not have any pain or fever She was initially diagnosed with invasive cancer of the left breast in the upper-outer quadrant and underwent image guided biopsy which showed invasive cancer, lobular phenotype, receptor positive, HER-2-negative. This looked to be 3 cm on MRI.she underwent left partial mastectomy and sentinel no biopsy and subsequent reexcision of margins on 12/25/2012 following 9 months of neoadjuvant letrozole.  She completed her radiation therapy on September 18. The seroma developed after the radiation therapy  Exam:  The patient looks well. No distress.  Left breast exam is favorable. No signs of infection. Fluid collection is gone. Tissues are soft and nontender. I remove the seroma drain .  Assessment:  Large seroma left breast, upper outer quadrant.This developed after radiation therapy.  This has now resolved following placement of drain. Invasive lobular carcinoma left breast, upper outer quadrant, ER positive, PR negative, HER-2-negative. Stage ypT1c, ypN0.  Status post neoadjuvant antiestrogen therapy with good response.  Status post left partial mastectomy with needle localization, sentinel node biopsy, and subsequent reexcision of anterior margin with clear margins. Recovering uneventfully   Plan:  Drain removed today Return to see me in 3 months.     [name]. Derrell Lolling, M.D., [facility]  General and Minimally invasive Surgery  Breast and Colorectal Surgery  Office: [phone]  Pager: [phone]

## 2013-06-02 DIAGNOSIS — J209 Acute bronchitis, unspecified: Secondary | ICD-10-CM | POA: Diagnosis not present

## 2013-06-14 ENCOUNTER — Telehealth: Payer: Self-pay | Admitting: Oncology

## 2013-06-14 NOTE — Telephone Encounter (Signed)
Faxed pt medical records to [facility]

## 2013-06-21 ENCOUNTER — Encounter (INDEPENDENT_AMBULATORY_CARE_PROVIDER_SITE_OTHER): Payer: Self-pay | Admitting: General Surgery

## 2013-08-19 ENCOUNTER — Other Ambulatory Visit: Payer: Medicare Other | Admitting: Lab

## 2013-08-19 ENCOUNTER — Other Ambulatory Visit (HOSPITAL_BASED_OUTPATIENT_CLINIC_OR_DEPARTMENT_OTHER): Payer: Medicare Other

## 2013-08-19 ENCOUNTER — Encounter: Payer: Self-pay | Admitting: Adult Health

## 2013-08-19 ENCOUNTER — Ambulatory Visit (HOSPITAL_BASED_OUTPATIENT_CLINIC_OR_DEPARTMENT_OTHER): Payer: Medicare Other | Admitting: Adult Health

## 2013-08-19 ENCOUNTER — Ambulatory Visit: Payer: Medicare Other | Admitting: Oncology

## 2013-08-19 VITALS — BP 148/80 | HR 76 | Temp 98.4°F | Resp 18 | Ht 61.0 in | Wt 179.9 lb

## 2013-08-19 DIAGNOSIS — C50419 Malignant neoplasm of upper-outer quadrant of unspecified female breast: Secondary | ICD-10-CM

## 2013-08-19 DIAGNOSIS — M899 Disorder of bone, unspecified: Secondary | ICD-10-CM

## 2013-08-19 DIAGNOSIS — M8580 Other specified disorders of bone density and structure, unspecified site: Secondary | ICD-10-CM

## 2013-08-19 DIAGNOSIS — M949 Disorder of cartilage, unspecified: Secondary | ICD-10-CM | POA: Diagnosis not present

## 2013-08-19 DIAGNOSIS — Z170 Estrogen receptor positive status [ER+]: Secondary | ICD-10-CM

## 2013-08-19 DIAGNOSIS — E559 Vitamin D deficiency, unspecified: Secondary | ICD-10-CM

## 2013-08-19 DIAGNOSIS — Z853 Personal history of malignant neoplasm of breast: Secondary | ICD-10-CM

## 2013-08-19 LAB — CBC WITH DIFFERENTIAL/PLATELET
BASO%: 0.9 % (ref 0.0–2.0)
BASOS ABS: 0.1 10*3/uL (ref 0.0–0.1)
EOS ABS: 0.4 10*3/uL (ref 0.0–0.5)
EOS%: 4.8 % (ref 0.0–7.0)
HCT: 40.9 % (ref 34.8–46.6)
HEMOGLOBIN: 13.2 g/dL (ref 11.6–15.9)
LYMPH%: 17.5 % (ref 14.0–49.7)
MCH: 26.7 pg (ref 25.1–34.0)
MCHC: 32.2 g/dL (ref 31.5–36.0)
MCV: 82.7 fL (ref 79.5–101.0)
MONO#: 0.8 10*3/uL (ref 0.1–0.9)
MONO%: 9 % (ref 0.0–14.0)
NEUT%: 67.8 % (ref 38.4–76.8)
NEUTROS ABS: 6.1 10*3/uL (ref 1.5–6.5)
PLATELETS: 188 10*3/uL (ref 145–400)
RBC: 4.94 10*6/uL (ref 3.70–5.45)
RDW: 16.9 % — ABNORMAL HIGH (ref 11.2–14.5)
WBC: 9 10*3/uL (ref 3.9–10.3)
lymph#: 1.6 10*3/uL (ref 0.9–3.3)

## 2013-08-19 LAB — COMPREHENSIVE METABOLIC PANEL (CC13)
ALBUMIN: 3.9 g/dL (ref 3.5–5.0)
ALK PHOS: 57 U/L (ref 40–150)
ALT: 41 U/L (ref 0–55)
ANION GAP: 11 meq/L (ref 3–11)
AST: 32 U/L (ref 5–34)
BILIRUBIN TOTAL: 0.49 mg/dL (ref 0.20–1.20)
BUN: 5.4 mg/dL — AB (ref 7.0–26.0)
CO2: 26 meq/L (ref 22–29)
Calcium: 9.7 mg/dL (ref 8.4–10.4)
Chloride: 106 mEq/L (ref 98–109)
Creatinine: 0.7 mg/dL (ref 0.6–1.1)
GLUCOSE: 114 mg/dL (ref 70–140)
POTASSIUM: 3.7 meq/L (ref 3.5–5.1)
Sodium: 143 mEq/L (ref 136–145)
Total Protein: 6.8 g/dL (ref 6.4–8.3)

## 2013-08-19 NOTE — Progress Notes (Signed)
OFFICE PROGRESS NOTE  CC Dr. Mayra Neer Dr. Fanny Skates Dr. Arloa Koh Dr. Paula Compton  DIAGNOSIS: 68 year old female with invasive lobular carcinoma of the left breast in the upper-outer quadrant stage IIA.  PRIOR THERAPY:  #1 patient originally presented to the multidisciplinary breast clinic in September 2013 with new diagnosis of left invasive lobular carcinoma by ultrasound measuring 1.6 and by MRI measuring 3 cm. She had undergone a needle core biopsy the tumor was lobular ER positive.  #2 she was seen by Dr. Valere Dross as well as Dr. Fanny Skates patient was interested in breast conservation. We recommended neoadjuvant antiestrogen therapy to try to reduce the size of the tumor for a good cosmetic result at this time of lumpectomy. She went on to receive letrozole 2.5 mg daily starting in 02/05/2012 through June 2014  #3 status post partial mastectomy with sentinel lymph node biopsy of the left breast. Final pathology did reveal residual disease measuring at T1 C. N0 ER positive PR negative HER-2/neu negative. Anterior margin was positive and it was reexcised.   #4 patient completed radiation therapy to the left breast on 02/18/2013.  #4 began letrozole 2.5 mg started 02/18/2013 for a total of 5 years  CURRENT THERAPY: letrozole 2.5 mg daily  INTERVAL HISTORY: Elizabeth Hebert 68 y.o. female returns for followup visit.  She was prescribed Letrozole daily since September, 2014.  She is tolerating the medication relatively well.  She does have hot flashes that are tolerable, but denies joint aches, vaginal dryness, or any further concerns.  She denies fevers, unintentional weight loss, new pain, or any further concerns.  A 10 point ROS is otherwise negative.  We reviewed her health maintenance below.    MEDICAL HISTORY: Past Medical History  Diagnosis Date  . Colitis   . Diverticulitis   . Fibroid tumor   . Anemia   . Broken arm 2011  . Hx of hernia repair    . History of cataract surgery 2012  . Anginal pain     infreq  . Hypertension   . Myocardial infarction     stents x 2 when had mi 99  . Depression   . Diabetes mellitus without complication   . Heart murmur   . GERD (gastroesophageal reflux disease)   . H/O hiatal hernia   . Cancer     breaast  . Arthritis   . Hx of radiation therapy 01/26/13- 02/18/13    left breast 4250 cGy 17 sessions    ALLERGIES:  is allergic to vasotec.  MEDICATIONS:  Current Outpatient Prescriptions  Medication Sig Dispense Refill  . amLODipine-olmesartan (AZOR) 5-40 MG per tablet Take 1 tablet by mouth daily.      Marland Kitchen ascorbic acid (VITAMIN C) 1000 MG tablet Take 1,000 mg by mouth daily.      Marland Kitchen aspirin 81 MG chewable tablet Chew 81 mg by mouth daily.      Marland Kitchen CALCIUM-MAGNESIUM-VITAMIN D PO Take 370 mg by mouth daily.      . cimetidine (TAGAMET) 200 MG tablet Take 200 mg by mouth 2 (two) times daily.      . Cranberry 500 MG CAPS Take 1 capsule by mouth daily.      . digoxin (LANOXIN) 0.125 MG tablet Take 0.125 mg by mouth daily.      . Exenatide ER (BYDUREON) 2 MG SUSR Inject 2 mg into the skin once a week.       . ezetimibe (ZETIA) 10 MG tablet Take 10 mg  by mouth daily.      . ferrous sulfate 325 (65 FE) MG tablet Take 325 mg by mouth daily with breakfast.      . glimepiride (AMARYL) 4 MG tablet Take 4 mg by mouth daily before breakfast.      . letrozole (FEMARA) 2.5 MG tablet Take 1 tablet (2.5 mg total) by mouth daily.  90 tablet  6  . metFORMIN (GLUCOPHAGE) 1000 MG tablet Take 1,000 mg by mouth 2 (two) times daily with a meal.      . metoprolol succinate (TOPROL-XL) 50 MG 24 hr tablet Take 50 mg by mouth daily. Take with or immediately following a meal.      . Multiple Vitamin (MULTIVITAMIN) capsule Take 1 capsule by mouth daily.      . nitroGLYCERIN (NITROSTAT) 0.4 MG SL tablet Place 0.4 mg under the tongue every 5 (five) minutes as needed for chest pain.       . rosuvastatin (CRESTOR) 20 MG tablet  Take 20 mg by mouth daily.      . sertraline (ZOLOFT) 50 MG tablet Take 50 mg by mouth daily.      . sitaGLIPtin (JANUVIA) 100 MG tablet Take 100 mg by mouth daily.       No current facility-administered medications for this visit.    SURGICAL HISTORY:  Past Surgical History  Procedure Laterality Date  . Cholecystectomy    . Colon resection    . Total abdominal hysterectomy  2001  . Eye surgery Bilateral     cat   . Hernia repair      rt  . Appendectomy    . Partial mastectomy with needle localization and axillary sentinel lymph node bx Left 12/08/2012    Procedure: LEFT PARTIAL MASTECTOMY WITH NEEDLE LOCALIZATION AND AXILLARY SENTINEL LYMPH NODE BIOPSIES TIMES FOUR;  Surgeon: Ernestene Mention, MD;  Location: [facility];  Service: General;  Laterality: Left;  Marland Kitchen Mastectomy, partial Left 12/25/2012    Procedure: MASTECTOMY PARTIAL with re-excision of margins;  Surgeon: Ernestene Mention, MD;  Location: [facility];  Service: General;  Laterality: Left;    REVIEW OF SYSTEMS:  A 10 point review of systems was conducted and is otherwise negative except for what is noted above.    Health Maintenance  Mammogram: 11/2012 Colonoscopy: has not scheduled, but will make an appointment Bone Density Scan: 02/2012, osteopenia Pap Smear: s/p TAH/BSO 2001 Eye Exam: followed every 6 months due to diabetes Lipid Panel: followed every 6 months by PCP   PHYSICAL EXAMINATION: Blood pressure 148/80, pulse 76, temperature 98.4 F (36.9 C), temperature source Oral, resp. rate 18, height 5\' 1"  (1.549 m), weight 179 lb 14.4 oz (81.602 kg). Body mass index is 34.01 kg/(m^2). GENERAL: Patient is a well appearing female in no acute distress HEENT:  Sclerae anicteric.  Oropharynx clear and moist. No ulcerations or evidence of oropharyngeal candidiasis. Neck is supple.  NODES:  No cervical, supraclavicular, or axillary lymphadenopathy palpated.  BREAST EXAM: right breast no masses, skin changes,  nodules, nipple changes, left breast s/p lumpectomy, no nodularity, masses, or sign of recurrence.  Benign breast exam.   LUNGS:  Clear to auscultation bilaterally.  No wheezes or rhonchi. HEART:  Regular rate and rhythm. No murmur appreciated. ABDOMEN:  Soft, nontender.  Positive, normoactive bowel sounds. No organomegaly palpated. MSK:  No focal spinal tenderness to palpation. Full range of motion bilaterally in the upper extremities. EXTREMITIES:  No peripheral edema.   SKIN:  Clear with  no obvious rashes or skin changes. No nail dyscrasia. NEURO:  Nonfocal. Well oriented.  Appropriate affect. ECOG PERFORMANCE STATUS: 0 - Asymptomatic      LABORATORY DATA: Lab Results  Component Value Date   WBC 9.0 08/19/2013   HGB 13.2 08/19/2013   HCT 40.9 08/19/2013   MCV 82.7 08/19/2013   PLT 188 08/19/2013      Chemistry      Component Value Date/Time   NA 143 08/19/2013 0854   NA 137 12/07/2012 0927   K 3.7 08/19/2013 0854   K 3.7 12/07/2012 0927   CL 100 12/07/2012 0927   CL 102 10/20/2012 0816   CO2 26 08/19/2013 0854   CO2 24 12/07/2012 0927   BUN 5.4* 08/19/2013 0854   BUN 8 12/07/2012 0927   CREATININE 0.7 08/19/2013 0854   CREATININE 0.67 12/07/2012 0927      Component Value Date/Time   CALCIUM 9.7 08/19/2013 0854   CALCIUM 9.7 12/07/2012 0927   ALKPHOS 57 08/19/2013 0854   ALKPHOS 67 12/07/2012 0927   AST 32 08/19/2013 0854   AST 38* 12/07/2012 0927   ALT 41 08/19/2013 0854   ALT 30 12/07/2012 0927   BILITOT 0.49 08/19/2013 0854   BILITOT 0.3 12/07/2012 0927      RADIOGRAPHIC STUDIES:  BILATERAL BREAST MRI WITH AND WITHOUT CONTRAST  Technique: Multiplanar, multisequence MR images of both breasts  were obtained prior to and following the intravenous administration  of 30ml of Multihance. Three dimensional images were evaluated at  the independent DynaCad workstation.  Comparison: 01/31/2012 breast MRI.  Findings: There is minimal bilateral parenchymal background  enhancement. The previously  seen 3.0 x 1.9 x 1.4 cm irregular mass-  like enhancement located within the left upper-outer quadrant has  markedly decreased in enhancement and has decreased in size now  measuring 1.9 x 1.0 x 1.0 cm in size. There are no other areas of  worrisome enhancement within either breast. The previously  discussed left axillary lymph nodes appear stable. There is no  evidence for axillary adenopathy or internal mammary adenopathy.  There are no additional findings.  IMPRESSION:  Interval decrease in size and enhancement associated with the left  breast mass located within the upper-outer quadrant now measuring  1.9 x 1.0 x 1.0 cm in size. No additional findings.  RECOMMENDATION:  Treatment plan.   ASSESSMENT: 68 year old female with  #1 stage IIa invasive lobular carcinoma measuring 3.0 cm of the left breast status post needle core biopsy performed in August 2013. Patient underwent neoadjuvant antiestrogen therapy with letrozole 2.5 mg daily.   #2 patient is status post left partial mastectomy and sentinel lymph node biopsy after receiving 9 months of neoadjuvant letrozole. Her final pathology revealed invasive lobular carcinoma, grade 2, with cancer present less than 1 mm to the anterior margin 0/4 sentinel nodes positive. Pathologic stage TI C. N0 ER +70% PR negative HER-2/neu negative. Patient's anterior margin was close and she has had a reexcision. This was performed on 12/25/2012. It showed no residual malignancy.  #4 patient is s/p adjuvant radiation therapy to the breast on 02/18/2013. She tolerated it well.  #5 patient began adjuvant letrozole 2.5 mg daily in September 2014.  PLAN:  #1 Patient is doing well today.  She has no sign of recurrence.  She will continue taking Letrozole daily.  Her CBC is stable, I reviewed it with her in detail.  Her CMP is pending.   #2  We reviewed her health maintenance above.  I offered to refer her to GI, she states she will call and make an appt for  a colonoscopy.  She is otherwise up to date.  We did talk about scheduling her next mammogram as well.  I have ordered this today.    #3  We reviewed survivorship in detail, including healthy diet, exercise, and self breast exam.  #4  Her bone density in 2013 demonstrated osteopenia.  We have ordered a repeat bone density at her next appointment in September.  I also recommended she continue taking calcium and vitamin d intake as well as weight bearing exercises.     #5  She will return in 6 months for labs, and evaluation.    All questions were answered. The patient knows to call the clinic with any problems, questions or concerns. We can certainly see the patient much sooner if necessary.  I spent 25 minutes counseling the patient face to face. The total time spent in the appointment was 30 minutes.   Minette Headland, [place] [phone]

## 2013-08-19 NOTE — Patient Instructions (Signed)
You are doing well.  You have no sign of recurrence.  Continue taking Letrozole daily.  We will see you back in 6 months.  Please call us if you have any questions or concerns.

## 2013-08-20 ENCOUNTER — Telehealth: Payer: Self-pay | Admitting: Oncology

## 2013-09-02 DIAGNOSIS — E11359 Type 2 diabetes mellitus with proliferative diabetic retinopathy without macular edema: Secondary | ICD-10-CM | POA: Diagnosis not present

## 2013-09-02 DIAGNOSIS — E669 Obesity, unspecified: Secondary | ICD-10-CM | POA: Diagnosis not present

## 2013-09-02 DIAGNOSIS — E1139 Type 2 diabetes mellitus with other diabetic ophthalmic complication: Secondary | ICD-10-CM | POA: Diagnosis not present

## 2013-09-02 DIAGNOSIS — I2510 Atherosclerotic heart disease of native coronary artery without angina pectoris: Secondary | ICD-10-CM | POA: Diagnosis not present

## 2013-09-02 DIAGNOSIS — E7800 Pure hypercholesterolemia, unspecified: Secondary | ICD-10-CM | POA: Diagnosis not present

## 2013-09-02 DIAGNOSIS — R350 Frequency of micturition: Secondary | ICD-10-CM | POA: Diagnosis not present

## 2013-09-02 DIAGNOSIS — Z23 Encounter for immunization: Secondary | ICD-10-CM | POA: Diagnosis not present

## 2013-09-02 DIAGNOSIS — F329 Major depressive disorder, single episode, unspecified: Secondary | ICD-10-CM | POA: Diagnosis not present

## 2013-09-02 DIAGNOSIS — I119 Hypertensive heart disease without heart failure: Secondary | ICD-10-CM | POA: Diagnosis not present

## 2013-10-19 DIAGNOSIS — M5090 Cervical disc disorder, unspecified, unspecified cervical region: Secondary | ICD-10-CM | POA: Diagnosis not present

## 2013-11-05 ENCOUNTER — Ambulatory Visit
Admission: RE | Admit: 2013-11-05 | Discharge: 2013-11-05 | Disposition: A | Discharge status: Home / Self Care | Payer: Medicare Other | Source: Ambulatory Visit | Attending: Adult Health | Admitting: Adult Health

## 2013-11-05 DIAGNOSIS — Z853 Personal history of malignant neoplasm of breast: Secondary | ICD-10-CM

## 2013-11-05 DIAGNOSIS — R928 Other abnormal and inconclusive findings on diagnostic imaging of breast: Secondary | ICD-10-CM | POA: Diagnosis not present

## 2014-01-17 DIAGNOSIS — E11319 Type 2 diabetes mellitus with unspecified diabetic retinopathy without macular edema: Secondary | ICD-10-CM | POA: Diagnosis not present

## 2014-01-17 DIAGNOSIS — Z961 Presence of intraocular lens: Secondary | ICD-10-CM | POA: Diagnosis not present

## 2014-01-17 DIAGNOSIS — H524 Presbyopia: Secondary | ICD-10-CM | POA: Diagnosis not present

## 2014-01-17 DIAGNOSIS — E1139 Type 2 diabetes mellitus with other diabetic ophthalmic complication: Secondary | ICD-10-CM | POA: Diagnosis not present

## 2014-02-14 ENCOUNTER — Ambulatory Visit
Admission: RE | Admit: 2014-02-14 | Discharge: 2014-02-14 | Disposition: A | Discharge status: Home / Self Care | Payer: Medicare Other | Source: Ambulatory Visit | Attending: Oncology | Admitting: Oncology

## 2014-02-14 DIAGNOSIS — M8580 Other specified disorders of bone density and structure, unspecified site: Secondary | ICD-10-CM

## 2014-02-14 DIAGNOSIS — M810 Age-related osteoporosis without current pathological fracture: Secondary | ICD-10-CM | POA: Diagnosis not present

## 2014-02-25 ENCOUNTER — Encounter: Payer: Self-pay | Admitting: *Deleted

## 2014-02-25 NOTE — Progress Notes (Signed)
Received bone density results from [facility]. Sent to scan.

## 2014-02-28 ENCOUNTER — Ambulatory Visit (HOSPITAL_BASED_OUTPATIENT_CLINIC_OR_DEPARTMENT_OTHER): Payer: Medicare Other | Admitting: Adult Health

## 2014-02-28 ENCOUNTER — Encounter: Payer: Self-pay | Admitting: Adult Health

## 2014-02-28 ENCOUNTER — Telehealth: Payer: Self-pay | Admitting: Adult Health

## 2014-02-28 ENCOUNTER — Other Ambulatory Visit (HOSPITAL_BASED_OUTPATIENT_CLINIC_OR_DEPARTMENT_OTHER): Payer: Medicare Other

## 2014-02-28 VITALS — BP 164/73 | HR 80 | Temp 97.5°F | Resp 20 | Ht 61.0 in | Wt 183.3 lb

## 2014-02-28 DIAGNOSIS — C50412 Malignant neoplasm of upper-outer quadrant of left female breast: Secondary | ICD-10-CM

## 2014-02-28 DIAGNOSIS — C50419 Malignant neoplasm of upper-outer quadrant of unspecified female breast: Secondary | ICD-10-CM

## 2014-02-28 DIAGNOSIS — Z170 Estrogen receptor positive status [ER+]: Secondary | ICD-10-CM | POA: Diagnosis not present

## 2014-02-28 DIAGNOSIS — M810 Age-related osteoporosis without current pathological fracture: Secondary | ICD-10-CM | POA: Diagnosis not present

## 2014-02-28 LAB — COMPREHENSIVE METABOLIC PANEL (CC13)
ALBUMIN: 3.9 g/dL (ref 3.5–5.0)
ALT: 39 U/L (ref 0–55)
ANION GAP: 12 meq/L — AB (ref 3–11)
AST: 31 U/L (ref 5–34)
Alkaline Phosphatase: 72 U/L (ref 40–150)
BUN: 7.5 mg/dL (ref 7.0–26.0)
CALCIUM: 9.7 mg/dL (ref 8.4–10.4)
CHLORIDE: 104 meq/L (ref 98–109)
CO2: 26 meq/L (ref 22–29)
Creatinine: 0.9 mg/dL (ref 0.6–1.1)
GLUCOSE: 224 mg/dL — AB (ref 70–140)
POTASSIUM: 3.7 meq/L (ref 3.5–5.1)
SODIUM: 142 meq/L (ref 136–145)
TOTAL PROTEIN: 7.4 g/dL (ref 6.4–8.3)
Total Bilirubin: 0.51 mg/dL (ref 0.20–1.20)

## 2014-02-28 LAB — CBC WITH DIFFERENTIAL/PLATELET
BASO%: 1 % (ref 0.0–2.0)
Basophils Absolute: 0.1 10*3/uL (ref 0.0–0.1)
EOS ABS: 0.3 10*3/uL (ref 0.0–0.5)
EOS%: 3.6 % (ref 0.0–7.0)
HCT: 41.6 % (ref 34.8–46.6)
HGB: 13.3 g/dL (ref 11.6–15.9)
LYMPH#: 1.9 10*3/uL (ref 0.9–3.3)
LYMPH%: 22 % (ref 14.0–49.7)
MCH: 27.2 pg (ref 25.1–34.0)
MCHC: 32 g/dL (ref 31.5–36.0)
MCV: 85.1 fL (ref 79.5–101.0)
MONO#: 0.6 10*3/uL (ref 0.1–0.9)
MONO%: 7.3 % (ref 0.0–14.0)
NEUT%: 66.1 % (ref 38.4–76.8)
NEUTROS ABS: 5.8 10*3/uL (ref 1.5–6.5)
PLATELETS: 209 10*3/uL (ref 145–400)
RBC: 4.89 10*6/uL (ref 3.70–5.45)
RDW: 15.3 % — ABNORMAL HIGH (ref 11.2–14.5)
WBC: 8.8 10*3/uL (ref 3.9–10.3)

## 2014-02-28 NOTE — Progress Notes (Signed)
OFFICE PROGRESS NOTE  CC Dr. Mayra Neer Dr. Fanny Skates Dr. Arloa Koh Dr. Paula Compton  DIAGNOSIS: 68 year old female with invasive lobular carcinoma of the left breast in the upper-outer quadrant stage IIA.  PRIOR THERAPY:  #1 patient originally presented to the multidisciplinary breast clinic in September 2013 with new diagnosis of left invasive lobular carcinoma by ultrasound measuring 1.6 and by MRI measuring 3 cm. She had undergone a needle core biopsy the tumor was lobular ER positive.  #2 she was seen by Dr. Valere Dross as well as Dr. Fanny Skates patient was interested in breast conservation. We recommended neoadjuvant antiestrogen therapy to try to reduce the size of the tumor for a good cosmetic result at this time of lumpectomy. She went on to receive letrozole 2.5 mg daily starting in 02/05/2012 through June 2014  #3 status post partial mastectomy with sentinel lymph node biopsy of the left breast. Final pathology did reveal residual disease measuring at T1 C. N0 ER positive PR negative HER-2/neu negative. Anterior margin was positive and it was reexcised.   #4 patient completed radiation therapy to the left breast on 02/18/2013.  #4 began letrozole 2.5 mg started 02/18/2013 for a total of 5 years  CURRENT THERAPY: letrozole 2.5 mg daily  INTERVAL HISTORY: Elizabeth Hebert 68 y.o. female returns for followup visit.  She was prescribed Letrozole daily since September, 2014.  She continues to tolerate the medication well.  She says that she is hot all the time.  She can't really tell if she is having any hot flashes.  She denies joint pain, vaginal dryness, dry eyes, new pain, unintentional weight loss, headaches, weakness, numbness, bowel/bladder changes.  We updated her health maintenance below.   MEDICAL HISTORY: Past Medical History  Diagnosis Date  . Colitis   . Diverticulitis   . Fibroid tumor   . Anemia   . Broken arm 2011  . Hx of hernia repair    . History of cataract surgery 2012  . Anginal pain     infreq  . Hypertension   . Myocardial infarction     stents x 2 when had mi 99  . Depression   . Diabetes mellitus without complication   . Heart murmur   . GERD (gastroesophageal reflux disease)   . H/O hiatal hernia   . Cancer     breaast  . Arthritis   . Hx of radiation therapy 01/26/13- 02/18/13    left breast 4250 cGy 17 sessions    ALLERGIES:  is allergic to vasotec.  MEDICATIONS:  Current Outpatient Prescriptions  Medication Sig Dispense Refill  . amLODipine-olmesartan (AZOR) 5-40 MG per tablet Take 1 tablet by mouth daily.      Marland Kitchen ascorbic acid (VITAMIN C) 1000 MG tablet Take 1,000 mg by mouth daily.      Marland Kitchen aspirin 81 MG chewable tablet Chew 81 mg by mouth daily.      Marland Kitchen CALCIUM-MAGNESIUM-VITAMIN D PO Take 370 mg by mouth daily.      . cimetidine (TAGAMET) 200 MG tablet Take 200 mg by mouth 2 (two) times daily.      . Cranberry 500 MG CAPS Take 1 capsule by mouth daily.      . digoxin (LANOXIN) 0.125 MG tablet Take 0.125 mg by mouth daily.      . Exenatide ER (BYDUREON) 2 MG SUSR Inject 2 mg into the skin once a week.       . ezetimibe (ZETIA) 10 MG tablet Take 10 mg  by mouth daily.      . ferrous sulfate 325 (65 FE) MG tablet Take 325 mg by mouth daily with breakfast.      . glimepiride (AMARYL) 4 MG tablet Take 4 mg by mouth daily before breakfast.      . letrozole (FEMARA) 2.5 MG tablet Take 1 tablet (2.5 mg total) by mouth daily.  90 tablet  6  . metFORMIN (GLUCOPHAGE) 1000 MG tablet Take 1,000 mg by mouth 2 (two) times daily with a meal.      . metoprolol succinate (TOPROL-XL) 50 MG 24 hr tablet Take 50 mg by mouth daily. Take with or immediately following a meal.      . Multiple Vitamin (MULTIVITAMIN) capsule Take 1 capsule by mouth daily.      . nitroGLYCERIN (NITROSTAT) 0.4 MG SL tablet Place 0.4 mg under the tongue every 5 (five) minutes as needed for chest pain.       . rosuvastatin (CRESTOR) 20 MG tablet  Take 20 mg by mouth daily.      . sertraline (ZOLOFT) 50 MG tablet Take 50 mg by mouth daily.      . sitaGLIPtin (JANUVIA) 100 MG tablet Take 100 mg by mouth daily.       No current facility-administered medications for this visit.    SURGICAL HISTORY:  Past Surgical History  Procedure Laterality Date  . Cholecystectomy    . Colon resection    . Total abdominal hysterectomy  2001  . Eye surgery Bilateral     cat   . Hernia repair      rt  . Appendectomy    . Partial mastectomy with needle localization and axillary sentinel lymph node bx Left 12/08/2012    Procedure: LEFT PARTIAL MASTECTOMY WITH NEEDLE LOCALIZATION AND AXILLARY SENTINEL LYMPH NODE BIOPSIES TIMES FOUR;  Surgeon: Adin Hector, MD;  Location: [place];  Service: General;  Laterality: Left;  Marland Kitchen Mastectomy, partial Left 12/25/2012    Procedure: MASTECTOMY PARTIAL with re-excision of margins;  Surgeon: Adin Hector, MD;  Location: [place];  Service: General;  Laterality: Left;    REVIEW OF SYSTEMS:  A 10 point review of systems was conducted and is otherwise negative except for what is noted above.    Health Maintenance  Mammogram: 11/2013 Colonoscopy: has not scheduled, but will make an appointment Bone Density Scan: 02/2014, osteoporosis Pap Smear: s/p TAH/BSO 2001 Eye Exam: followed every 6 months due to diabetes Lipid Panel: followed every 6 months by PCP Vitamin D level: never   PHYSICAL EXAMINATION: Blood pressure 164/73, pulse 80, temperature 97.5 F (36.4 C), temperature source Oral, resp. rate 20, height $RemoveBe'5\' 1"'TtuLbiUZb$  (1.549 m), weight 183 lb 4.8 oz (83.144 kg). Body mass index is 34.65 kg/(m^2). GENERAL: Patient is a well appearing female in no acute distress HEENT:  Sclerae anicteric.  Oropharynx clear and moist. No ulcerations or evidence of oropharyngeal candidiasis. Neck is supple.  NODES:  No cervical, supraclavicular, or axillary lymphadenopathy palpated.  BREAST EXAM: right breast no  masses, skin changes, nodules, nipple changes, left breast s/p lumpectomy, no nodularity, masses, or sign of recurrence.  Benign breast exam.   LUNGS:  Clear to auscultation bilaterally.  No wheezes or rhonchi. HEART:  Regular rate and rhythm. No murmur appreciated. ABDOMEN:  Soft, nontender.  Positive, normoactive bowel sounds. No organomegaly palpated. MSK:  No focal spinal tenderness to palpation. Full range of motion bilaterally in the upper extremities. EXTREMITIES:  No peripheral edema.  SKIN:  Clear with no obvious rashes or skin changes. No nail dyscrasia. NEURO:  Nonfocal. Well oriented.  Appropriate affect. ECOG PERFORMANCE STATUS: 0 - Asymptomatic  LABORATORY DATA: Lab Results  Component Value Date   WBC 8.8 02/28/2014   HGB 13.3 02/28/2014   HCT 41.6 02/28/2014   MCV 85.1 02/28/2014   PLT 209 02/28/2014      Chemistry      Component Value Date/Time   NA 142 02/28/2014 1013   NA 137 12/07/2012 0927   K 3.7 02/28/2014 1013   K 3.7 12/07/2012 0927   CL 100 12/07/2012 0927   CL 102 10/20/2012 0816   CO2 26 02/28/2014 1013   CO2 24 12/07/2012 0927   BUN 7.5 02/28/2014 1013   BUN 8 12/07/2012 0927   CREATININE 0.9 02/28/2014 1013   CREATININE 0.67 12/07/2012 0927      Component Value Date/Time   CALCIUM 9.7 02/28/2014 1013   CALCIUM 9.7 12/07/2012 0927   ALKPHOS 72 02/28/2014 1013   ALKPHOS 67 12/07/2012 0927   AST 31 02/28/2014 1013   AST 38* 12/07/2012 0927   ALT 39 02/28/2014 1013   ALT 30 12/07/2012 0927   BILITOT 0.51 02/28/2014 1013   BILITOT 0.3 12/07/2012 6144      ASSESSMENT: 68 year old female with  #1 stage IIa invasive lobular carcinoma measuring 3.0 cm of the left breast status post needle core biopsy performed in August 2013. Patient underwent neoadjuvant antiestrogen therapy with letrozole 2.5 mg daily.   #2 patient is status post left partial mastectomy and sentinel lymph node biopsy after receiving 9 months of neoadjuvant letrozole. Her final pathology revealed invasive  lobular carcinoma, grade 2, with cancer present less than 1 mm to the anterior margin 0/4 sentinel nodes positive. Pathologic stage TI C. N0 ER +70% PR negative HER-2/neu negative. Patient's anterior margin was close and she has had a reexcision. This was performed on 12/25/2012. It showed no residual malignancy.  #3 patient is s/p adjuvant radiation therapy to the breast on 02/18/2013. She tolerated it well.  #4 patient began adjuvant letrozole 2.5 mg daily in September 2014.  PLAN:  Elizabeth Hebert is doing well today.  She has no sign of recurrence.  She is tolerating Letrozole well and will continue this.  Her CBC is stable.  A CMP is pending.    We reviewed her bone density which demonstrated osteoporosis.  Her PCP has recommended and is arranging Prolia.  I agree with this.  I gave her detailed information on Prolia and osteoporosis today.  I recommended calcium and vitamin d intake along with weight bearing exercises.    Elizabeth Hebert will return in 6 months for labs and evaluation.    All questions were answered. The patient knows to call the clinic with any problems, questions or concerns. We can certainly see the patient much sooner if necessary.  I spent 25 minutes counseling the patient face to face. The total time spent in the appointment was 30 minutes.   Minette Headland, [place] [phone]

## 2014-02-28 NOTE — Patient Instructions (Signed)
Osteoporosis Throughout your life, your body breaks down old bone and replaces it with new bone. As you get older, your body does not replace bone as quickly as it breaks it down. By the age of 30 years, most people begin to gradually lose bone because of the imbalance between bone loss and replacement. Some people lose more bone than others. Bone loss beyond a specified normal degree is considered osteoporosis.  Osteoporosis affects the strength and durability of your bones. The inside of the ends of your bones and your flat bones, like the bones of your pelvis, look like honeycomb, filled with tiny open spaces. As bone loss occurs, your bones become less dense. This means that the open spaces inside your bones become bigger and the walls between these spaces become thinner. This makes your bones weaker. Bones of a person with osteoporosis can become so weak that they can break (fracture) during minor accidents, such as a simple fall. CAUSES  The following factors have been associated with the development of osteoporosis:  Smoking.  Drinking more than 2 alcoholic drinks several days per week.  Long-term use of certain medicines:  Corticosteroids.  Chemotherapy medicines.  Thyroid medicines.  Antiepileptic medicines.  Gonadal hormone suppression medicine.  Immunosuppression medicine.  Being underweight.  Lack of physical activity.  Lack of exposure to the sun. This can lead to vitamin D deficiency.  Certain medical conditions:  Certain inflammatory bowel diseases, such as Crohn disease and ulcerative colitis.  Diabetes.  Hyperthyroidism.  Hyperparathyroidism. RISK FACTORS Anyone can develop osteoporosis. However, the following factors can increase your risk of developing osteoporosis:  Gender--Women are at higher risk than men.  Age--Being older than 50 years increases your risk.  Ethnicity--White and Asian people have an increased risk.  Weight --Being extremely  underweight can increase your risk of osteoporosis.  Family history of osteoporosis--Having a family member who has developed osteoporosis can increase your risk. SYMPTOMS  Usually, people with osteoporosis have no symptoms.  DIAGNOSIS  Signs during a physical exam that may prompt your caregiver to suspect osteoporosis include:  Decreased height. This is usually caused by the compression of the bones that form your spine (vertebrae) because they have weakened and become fractured.  A curving or rounding of the upper back (kyphosis). To confirm signs of osteoporosis, your caregiver may request a procedure that uses 2 low-dose X-ray beams with different levels of energy to measure your bone mineral density (dual-energy X-ray absorptiometry [DXA]). Also, your caregiver may check your level of vitamin D. TREATMENT  The goal of osteoporosis treatment is to strengthen bones in order to decrease the risk of bone fractures. There are different types of medicines available to help achieve this goal. Some of these medicines work by slowing the processes of bone loss. Some medicines work by increasing bone density. Treatment also involves making sure that your levels of calcium and vitamin D are adequate. PREVENTION  There are things you can do to help prevent osteoporosis. Adequate intake of calcium and vitamin D can help you achieve optimal bone mineral density. Regular exercise can also help, especially resistance and weight-bearing activities. If you smoke, quitting smoking is an important part of osteoporosis prevention. MAKE SURE YOU:  Understand these instructions.  Will watch your condition.  Will get help right away if you are not doing well or get worse. FOR MORE INFORMATION www.osteo.org and www.nof.org Document Released: 02/27/2005 Document Revised: 09/14/2012 Document Reviewed: 05/04/2011 Exit[employer] Patient Information 2015 [employer]. This information is not   intended to replace advice  given to you by your health care provider. Make sure you discuss any questions you have with your health care provider. Denosumab injection What is this medicine? DENOSUMAB (den oh sue mab) slows bone breakdown. Prolia is used to treat osteoporosis in women after menopause and in men. Delton See is used to prevent bone fractures and other bone problems caused by cancer bone metastases. Delton See is also used to treat giant cell tumor of the bone. This medicine may be used for other purposes; ask your health care provider or pharmacist if you have questions. COMMON BRAND NAME(S): Prolia, XGEVA What should I tell my health care provider before I take this medicine? They need to know if you have any of these conditions: -dental disease -eczema -infection or history of infections -kidney disease or on dialysis -low blood calcium or vitamin D -malabsorption syndrome -scheduled to have surgery or tooth extraction -taking medicine that contains denosumab -thyroid or parathyroid disease -an unusual reaction to denosumab, other medicines, foods, dyes, or preservatives -pregnant or trying to get pregnant -breast-feeding How should I use this medicine? This medicine is for injection under the skin. It is given by a health care professional in a hospital or clinic setting. If you are getting Prolia, a special MedGuide will be given to you by the pharmacist with each prescription and refill. Be sure to read this information carefully each time. For Prolia, talk to your pediatrician regarding the use of this medicine in children. Special care may be needed. For Delton See, talk to your pediatrician regarding the use of this medicine in children. While this drug may be prescribed for children as young as 13 years for selected conditions, precautions do apply. Overdosage: If you think you've taken too much of this medicine contact a poison control center or emergency room at once. Overdosage: If you think you have taken  too much of this medicine contact a poison control center or emergency room at once. NOTE: This medicine is only for you. Do not share this medicine with others. What if I miss a dose? It is important not to miss your dose. Call your doctor or health care professional if you are unable to keep an appointment. What may interact with this medicine? Do not take this medicine with any of the following medications: -other medicines containing denosumab This medicine may also interact with the following medications: -medicines that suppress the immune system -medicines that treat cancer -steroid medicines like prednisone or cortisone This list may not describe all possible interactions. Give your health care provider a list of all the medicines, herbs, non-prescription drugs, or dietary supplements you use. Also tell them if you smoke, drink alcohol, or use illegal drugs. Some items may interact with your medicine. What should I watch for while using this medicine? Visit your doctor or health care professional for regular checks on your progress. Your doctor or health care professional may order blood tests and other tests to see how you are doing. Call your doctor or health care professional if you get a cold or other infection while receiving this medicine. Do not treat yourself. This medicine may decrease your body's ability to fight infection. You should make sure you get enough calcium and vitamin D while you are taking this medicine, unless your doctor tells you not to. Discuss the foods you eat and the vitamins you take with your health care professional. See your dentist regularly. Brush and floss your teeth as directed. Before you have  any dental work done, tell your dentist you are receiving this medicine. Do not become pregnant while taking this medicine or for 5 months after stopping it. Women should inform their doctor if they wish to become pregnant or think they might be pregnant. There is  a potential for serious side effects to an unborn child. Talk to your health care professional or pharmacist for more information. What side effects may I notice from receiving this medicine? Side effects that you should report to your doctor or health care professional as soon as possible: -allergic reactions like skin rash, itching or hives, swelling of the face, lips, or tongue -breathing problems -chest pain -fast, irregular heartbeat -feeling faint or lightheaded, falls -fever, chills, or any other sign of infection -muscle spasms, tightening, or twitches -numbness or tingling -skin blisters or bumps, or is dry, peels, or red -slow healing or unexplained pain in the mouth or jaw -unusual bleeding or bruising Side effects that usually do not require medical attention (Report these to your doctor or health care professional if they continue or are bothersome.): -muscle pain -stomach upset, gas This list may not describe all possible side effects. Call your doctor for medical advice about side effects. You may report side effects to FDA at 1-800-FDA-1088. Where should I keep my medicine? This medicine is only given in a clinic, doctor's office, or other health care setting and will not be stored at home. NOTE: This sheet is a summary. It may not cover all possible information. If you have questions about this medicine, talk to your doctor, pharmacist, or health care provider.  2015, Elsevier/Gold Standard. (2011-11-18 12:37:47)

## 2014-02-28 NOTE — Telephone Encounter (Signed)
per pof to sch pt appt-per pof to see Feng-no sch out for [facility] yet-adv pt that we will call with appt @ a later date

## 2014-03-01 ENCOUNTER — Other Ambulatory Visit: Payer: Self-pay | Admitting: *Deleted

## 2014-03-01 DIAGNOSIS — C50412 Malignant neoplasm of upper-outer quadrant of left female breast: Secondary | ICD-10-CM

## 2014-03-01 MED ORDER — LETROZOLE 2.5 MG PO TABS: 2.5000 mg | ORAL_TABLET | Freq: Every day | ORAL | Status: DC

## 2014-03-14 DIAGNOSIS — D509 Iron deficiency anemia, unspecified: Secondary | ICD-10-CM | POA: Diagnosis not present

## 2014-03-14 DIAGNOSIS — F322 Major depressive disorder, single episode, severe without psychotic features: Secondary | ICD-10-CM | POA: Diagnosis not present

## 2014-03-14 DIAGNOSIS — I119 Hypertensive heart disease without heart failure: Secondary | ICD-10-CM | POA: Diagnosis not present

## 2014-03-14 DIAGNOSIS — I358 Other nonrheumatic aortic valve disorders: Secondary | ICD-10-CM | POA: Diagnosis not present

## 2014-03-14 DIAGNOSIS — Z0000 Encounter for general adult medical examination without abnormal findings: Secondary | ICD-10-CM | POA: Diagnosis not present

## 2014-03-14 DIAGNOSIS — C50919 Malignant neoplasm of unspecified site of unspecified female breast: Secondary | ICD-10-CM | POA: Diagnosis not present

## 2014-03-14 DIAGNOSIS — Z23 Encounter for immunization: Secondary | ICD-10-CM | POA: Diagnosis not present

## 2014-03-14 DIAGNOSIS — K5090 Crohn's disease, unspecified, without complications: Secondary | ICD-10-CM | POA: Diagnosis not present

## 2014-03-14 DIAGNOSIS — E11319 Type 2 diabetes mellitus with unspecified diabetic retinopathy without macular edema: Secondary | ICD-10-CM | POA: Diagnosis not present

## 2014-03-18 ENCOUNTER — Other Ambulatory Visit: Payer: Self-pay

## 2014-04-04 ENCOUNTER — Encounter: Payer: Self-pay | Admitting: Adult Health

## 2014-04-21 DIAGNOSIS — Z86010 Personal history of colonic polyps: Secondary | ICD-10-CM | POA: Diagnosis not present

## 2014-04-21 DIAGNOSIS — K219 Gastro-esophageal reflux disease without esophagitis: Secondary | ICD-10-CM | POA: Diagnosis not present

## 2014-05-04 ENCOUNTER — Other Ambulatory Visit: Payer: Self-pay | Admitting: Gastroenterology

## 2014-05-04 DIAGNOSIS — Z09 Encounter for follow-up examination after completed treatment for conditions other than malignant neoplasm: Secondary | ICD-10-CM | POA: Diagnosis not present

## 2014-05-04 DIAGNOSIS — D126 Benign neoplasm of colon, unspecified: Secondary | ICD-10-CM | POA: Diagnosis not present

## 2014-05-04 DIAGNOSIS — D124 Benign neoplasm of descending colon: Secondary | ICD-10-CM | POA: Diagnosis not present

## 2014-05-04 DIAGNOSIS — K5730 Diverticulosis of large intestine without perforation or abscess without bleeding: Secondary | ICD-10-CM | POA: Diagnosis not present

## 2014-05-04 DIAGNOSIS — Z86010 Personal history of colonic polyps: Secondary | ICD-10-CM | POA: Diagnosis not present

## 2014-05-08 ENCOUNTER — Other Ambulatory Visit: Payer: Self-pay | Admitting: Oncology

## 2014-05-10 ENCOUNTER — Telehealth: Payer: Self-pay | Admitting: Hematology

## 2014-05-10 NOTE — Telephone Encounter (Signed)
, °

## 2014-06-18 ENCOUNTER — Telehealth: Payer: Self-pay | Admitting: Hematology

## 2014-06-18 NOTE — Telephone Encounter (Signed)
s.t  pt regarding to d.t. change of appt.....mailed pt appt sched/letter

## 2014-08-22 ENCOUNTER — Ambulatory Visit (HOSPITAL_BASED_OUTPATIENT_CLINIC_OR_DEPARTMENT_OTHER): Payer: Medicare Other | Admitting: Hematology

## 2014-08-22 ENCOUNTER — Telehealth: Payer: Self-pay | Admitting: Hematology

## 2014-08-22 ENCOUNTER — Encounter: Payer: Self-pay | Admitting: Hematology

## 2014-08-22 VITALS — BP 151/72 | HR 82 | Temp 97.9°F | Resp 18 | Ht 61.0 in | Wt 183.9 lb

## 2014-08-22 DIAGNOSIS — Z853 Personal history of malignant neoplasm of breast: Secondary | ICD-10-CM

## 2014-08-22 DIAGNOSIS — C50412 Malignant neoplasm of upper-outer quadrant of left female breast: Secondary | ICD-10-CM

## 2014-08-22 DIAGNOSIS — Z79810 Long term (current) use of selective estrogen receptor modulators (SERMs): Secondary | ICD-10-CM

## 2014-08-22 DIAGNOSIS — M810 Age-related osteoporosis without current pathological fracture: Secondary | ICD-10-CM | POA: Diagnosis not present

## 2014-08-22 NOTE — Telephone Encounter (Signed)
Gave avs & calendar for September. Will schedule Mammo once order is added for June.

## 2014-08-22 NOTE — Progress Notes (Signed)
OFFICE PROGRESS NOTE  CC Dr. Mayra Neer Dr. Fanny Skates Dr. Arloa Koh Dr. Paula Compton  DIAGNOSIS: 69 year old female with invasive lobular carcinoma of the left breast in the upper-outer quadrant stage IIA.  PRIOR THERAPY:  #1 patient originally presented to the multidisciplinary breast clinic in September 2013 with new diagnosis of left invasive lobular carcinoma by ultrasound measuring 1.6 and by MRI measuring 3 cm. She had undergone a needle core biopsy the tumor was lobular ER positive.  #2 she was seen by Dr. Valere Dross as well as Dr. Fanny Skates patient was interested in breast conservation. We recommended neoadjuvant antiestrogen therapy to try to reduce the size of the tumor for a good cosmetic result at this time of lumpectomy. She went on to receive letrozole 2.5 mg daily starting in 02/05/2012 through June 2014  #3 status post partial mastectomy with sentinel lymph node biopsy of the left breast. Final pathology did reveal residual disease measuring at T1 C. N0 ER positive PR negative HER-2/neu negative. Anterior margin was positive and it was reexcised. Oncotype score 29  #4 patient completed radiation therapy to the left breast on 02/18/2013.  #4 began letrozole 2.5 mg started 02/18/2013 for a total of 5 years  CURRENT THERAPY: letrozole 2.5 mg daily  INTERVAL HISTORY: Elizabeth Hebert 69 y.o. female returns for followup visit.  She was seen by our nurse practitioner Mendel Ryder 6 months ago. She has been doing well overall. She has hot flush at night daily basis, but she is able to tolerate. She has chronic low back pain which is stable and mild. She denies any other muscular or joint discomfort, or other noticeable side effects from which resolved. She has good appetite and eating well.    MEDICAL HISTORY: Past Medical History  Diagnosis Date  . Colitis   . Diverticulitis   . Fibroid tumor   . Anemia   . Broken arm 2011  . Hx of hernia repair   .  History of cataract surgery 2012  . Anginal pain     infreq  . Hypertension   . Myocardial infarction     stents x 2 when had mi 99  . Depression   . Diabetes mellitus without complication   . Heart murmur   . GERD (gastroesophageal reflux disease)   . H/O hiatal hernia   . Cancer     breaast  . Arthritis   . Hx of radiation therapy 01/26/13- 02/18/13    left breast 4250 cGy 17 sessions    ALLERGIES:  is allergic to vasotec.  MEDICATIONS:  Current Outpatient Prescriptions  Medication Sig Dispense Refill  . amLODipine-olmesartan (AZOR) 10-40 MG per tablet Take 1 tablet by mouth daily.    Marland Kitchen ascorbic acid (VITAMIN C) 1000 MG tablet Take 1,000 mg by mouth daily.    Marland Kitchen aspirin 81 MG chewable tablet Chew 81 mg by mouth daily.    Marland Kitchen CALCIUM-MAGNESIUM-VITAMIN D PO Take 370 mg by mouth daily.    . cimetidine (TAGAMET) 200 MG tablet Take 200 mg by mouth 2 (two) times daily.    . Cranberry 500 MG CAPS Take 1 capsule by mouth daily.    Marland Kitchen denosumab (PROLIA) 60 MG/ML SOLN injection Inject 60 mg into the skin every 6 (six) months. Administer in upper arm, thigh, or abdomen    . digoxin (LANOXIN) 0.125 MG tablet Take 0.125 mg by mouth daily.    . Exenatide ER (BYDUREON) 2 MG SUSR Inject 2 mg into the skin once  a week.     . ezetimibe (ZETIA) 10 MG tablet Take 10 mg by mouth daily.    . ferrous sulfate 325 (65 FE) MG tablet Take 325 mg by mouth daily with breakfast.    . glimepiride (AMARYL) 4 MG tablet Take 4 mg by mouth daily before breakfast.    . letrozole (FEMARA) 2.5 MG tablet TAKE 1 TABLET (2.5 MG TOTAL) BY MOUTH DAILY. 90 tablet 4  . metFORMIN (GLUCOPHAGE) 1000 MG tablet Take 1,000 mg by mouth 2 (two) times daily with a meal.    . metoprolol succinate (TOPROL-XL) 50 MG 24 hr tablet Take 50 mg by mouth daily. Take with or immediately following a meal.    . Multiple Vitamin (MULTIVITAMIN) capsule Take 1 capsule by mouth daily.    . nitroGLYCERIN (NITROSTAT) 0.4 MG SL tablet Place 0.4 mg under  the tongue every 5 (five) minutes as needed for chest pain.     . rosuvastatin (CRESTOR) 20 MG tablet Take 20 mg by mouth daily.    . sertraline (ZOLOFT) 50 MG tablet Take 50 mg by mouth daily.    . sitaGLIPtin (JANUVIA) 100 MG tablet Take 100 mg by mouth daily.     No current facility-administered medications for this visit.    SURGICAL HISTORY:  Past Surgical History  Procedure Laterality Date  . Cholecystectomy    . Colon resection    . Total abdominal hysterectomy  2001  . Eye surgery Bilateral     cat   . Hernia repair      rt  . Appendectomy    . Partial mastectomy with needle localization and axillary sentinel lymph node bx Left 12/08/2012    Procedure: LEFT PARTIAL MASTECTOMY WITH NEEDLE LOCALIZATION AND AXILLARY SENTINEL LYMPH NODE BIOPSIES TIMES FOUR;  Surgeon: Adin Hector, MD;  Location: [place];  Service: General;  Laterality: Left;  Marland Kitchen Mastectomy, partial Left 12/25/2012    Procedure: MASTECTOMY PARTIAL with re-excision of margins;  Surgeon: Adin Hector, MD;  Location: [place];  Service: General;  Laterality: Left;    REVIEW OF SYSTEMS:  A 10 point review of systems was conducted and is otherwise negative except for what is noted above.    Health Maintenance  Mammogram: 11/2013 Colonoscopy: has not scheduled, but will make an appointment Bone Density Scan: 02/2014, osteoporosis Pap Smear: s/p TAH/BSO 2001 Eye Exam: followed every 6 months due to diabetes Lipid Panel: followed every 6 months by PCP Vitamin D level: never   PHYSICAL EXAMINATION: Blood pressure 151/72, pulse 82, temperature 97.9 F (36.6 C), temperature source Oral, resp. rate 18, height $RemoveBe'5\' 1"'gEYHgFRjn$  (1.549 m), weight 183 lb 14.4 oz (83.416 kg), SpO2 97 %. Body mass index is 34.77 kg/(m^2). GENERAL: Patient is a well appearing female in no acute distress HEENT:  Sclerae anicteric.  Oropharynx clear and moist. No ulcerations or evidence of oropharyngeal candidiasis. Neck is supple.   NODES:  No cervical, supraclavicular, or axillary lymphadenopathy palpated.  BREAST EXAM: right breast no masses, skin changes, nodules, nipple changes, left breast s/p lumpectomy, no nodularity, masses, or sign of recurrence.  Benign breast exam.   LUNGS:  Clear to auscultation bilaterally.  No wheezes or rhonchi. HEART:  Regular rate and rhythm. No murmur appreciated. ABDOMEN:  Soft, nontender.  Positive, normoactive bowel sounds. No organomegaly palpated. MSK:  No focal spinal tenderness to palpation. Full range of motion bilaterally in the upper extremities. EXTREMITIES:  No peripheral edema.   SKIN:  Clear with  no obvious rashes or skin changes. No nail dyscrasia. NEURO:  Nonfocal. Well oriented.  Appropriate affect. ECOG PERFORMANCE STATUS: 0 - Asymptomatic  LABORATORY DATA: Lab Results  Component Value Date   WBC 8.8 02/28/2014   HGB 13.3 02/28/2014   HCT 41.6 02/28/2014   MCV 85.1 02/28/2014   PLT 209 02/28/2014      Chemistry      Component Value Date/Time   NA 142 02/28/2014 1013   NA 137 12/07/2012 0927   K 3.7 02/28/2014 1013   K 3.7 12/07/2012 0927   CL 100 12/07/2012 0927   CL 102 10/20/2012 0816   CO2 26 02/28/2014 1013   CO2 24 12/07/2012 0927   BUN 7.5 02/28/2014 1013   BUN 8 12/07/2012 0927   CREATININE 0.9 02/28/2014 1013   CREATININE 0.67 12/07/2012 0927      Component Value Date/Time   CALCIUM 9.7 02/28/2014 1013   CALCIUM 9.7 12/07/2012 0927   ALKPHOS 72 02/28/2014 1013   ALKPHOS 67 12/07/2012 0927   AST 31 02/28/2014 1013   AST 38* 12/07/2012 0927   ALT 39 02/28/2014 1013   ALT 30 12/07/2012 0927   BILITOT 0.51 02/28/2014 1013   BILITOT 0.3 12/07/2012 8119      ASSESSMENT: 69 year old female with  1. Clinical stage II invasive lobular carcinoma of left breast, s/p neoadjuvant endocrine therapy, pTI CN0, ER +70% PR negative HER-2/neu negative. -She is doing clinically very well. Exam and mammogram last year showed no evidence of  recurrence -She will continue letrozole once daily, for total 5 years. -Continue yearly screening mammogram.  2. Osteoporosis -She is on Prolia, started about 5 months ago. She'll continue every 6 months at her primary care physician Dr. Chaney Malling office -Repeat bone density scan every 2 years. -We discussed letrozole may worse her osteoporosis. I encouraged her to continue calcium and vitamin D and regular exercise.  3. She will continue to follow up with Dr. Manuella Ghazi for her as a medical problems.  I spent 25 minutes counseling the patient face to face. The total time spent in the appointment was 30 minutes.  Plan RTC in 6 month with lab  You will have lab done at Dr. Raul Del office in early April, please include CMP and CBC.  Truitt Merle  08/22/2014

## 2014-08-23 ENCOUNTER — Other Ambulatory Visit: Payer: Self-pay | Admitting: Hematology

## 2014-08-23 DIAGNOSIS — C50412 Malignant neoplasm of upper-outer quadrant of left female breast: Secondary | ICD-10-CM

## 2014-09-21 DIAGNOSIS — E780 Pure hypercholesterolemia: Secondary | ICD-10-CM | POA: Diagnosis not present

## 2014-09-21 DIAGNOSIS — F322 Major depressive disorder, single episode, severe without psychotic features: Secondary | ICD-10-CM | POA: Diagnosis not present

## 2014-09-21 DIAGNOSIS — E11319 Type 2 diabetes mellitus with unspecified diabetic retinopathy without macular edema: Secondary | ICD-10-CM | POA: Diagnosis not present

## 2014-09-21 DIAGNOSIS — C50919 Malignant neoplasm of unspecified site of unspecified female breast: Secondary | ICD-10-CM | POA: Diagnosis not present

## 2014-09-21 DIAGNOSIS — I119 Hypertensive heart disease without heart failure: Secondary | ICD-10-CM | POA: Diagnosis not present

## 2014-09-21 DIAGNOSIS — K5090 Crohn's disease, unspecified, without complications: Secondary | ICD-10-CM | POA: Diagnosis not present

## 2014-09-21 DIAGNOSIS — I2510 Atherosclerotic heart disease of native coronary artery without angina pectoris: Secondary | ICD-10-CM | POA: Diagnosis not present

## 2014-11-03 ENCOUNTER — Other Ambulatory Visit: Payer: Self-pay | Admitting: Hematology

## 2014-11-03 DIAGNOSIS — C50412 Malignant neoplasm of upper-outer quadrant of left female breast: Secondary | ICD-10-CM

## 2014-11-09 ENCOUNTER — Ambulatory Visit
Admission: RE | Admit: 2014-11-09 | Discharge: 2014-11-09 | Disposition: A | Discharge status: Home / Self Care | Payer: Medicare Other | Source: Ambulatory Visit | Attending: Hematology | Admitting: Hematology

## 2014-11-09 ENCOUNTER — Ambulatory Visit: Payer: Medicare Other

## 2014-11-09 DIAGNOSIS — R928 Other abnormal and inconclusive findings on diagnostic imaging of breast: Secondary | ICD-10-CM | POA: Diagnosis not present

## 2014-11-09 DIAGNOSIS — C50412 Malignant neoplasm of upper-outer quadrant of left female breast: Secondary | ICD-10-CM

## 2014-11-09 DIAGNOSIS — Z853 Personal history of malignant neoplasm of breast: Secondary | ICD-10-CM | POA: Diagnosis not present

## 2014-11-28 ENCOUNTER — Other Ambulatory Visit: Payer: Self-pay

## 2015-01-24 DIAGNOSIS — Z961 Presence of intraocular lens: Secondary | ICD-10-CM | POA: Diagnosis not present

## 2015-01-24 DIAGNOSIS — H524 Presbyopia: Secondary | ICD-10-CM | POA: Diagnosis not present

## 2015-01-24 DIAGNOSIS — E11329 Type 2 diabetes mellitus with mild nonproliferative diabetic retinopathy without macular edema: Secondary | ICD-10-CM | POA: Diagnosis not present

## 2015-01-30 DIAGNOSIS — Z23 Encounter for immunization: Secondary | ICD-10-CM | POA: Diagnosis not present

## 2015-02-16 ENCOUNTER — Other Ambulatory Visit: Payer: Self-pay | Admitting: *Deleted

## 2015-02-16 DIAGNOSIS — C50419 Malignant neoplasm of upper-outer quadrant of unspecified female breast: Secondary | ICD-10-CM

## 2015-02-20 ENCOUNTER — Telehealth: Payer: Self-pay | Admitting: Hematology

## 2015-02-20 ENCOUNTER — Ambulatory Visit (HOSPITAL_BASED_OUTPATIENT_CLINIC_OR_DEPARTMENT_OTHER): Payer: Medicare Other | Admitting: Hematology

## 2015-02-20 ENCOUNTER — Encounter: Payer: Self-pay | Admitting: Hematology

## 2015-02-20 ENCOUNTER — Other Ambulatory Visit (HOSPITAL_BASED_OUTPATIENT_CLINIC_OR_DEPARTMENT_OTHER): Payer: Medicare Other

## 2015-02-20 VITALS — BP 139/54 | HR 93 | Temp 98.1°F | Resp 18 | Ht 61.0 in | Wt 177.7 lb

## 2015-02-20 DIAGNOSIS — C50419 Malignant neoplasm of upper-outer quadrant of unspecified female breast: Secondary | ICD-10-CM

## 2015-02-20 DIAGNOSIS — C50412 Malignant neoplasm of upper-outer quadrant of left female breast: Secondary | ICD-10-CM | POA: Diagnosis not present

## 2015-02-20 LAB — COMPREHENSIVE METABOLIC PANEL (CC13)
ALBUMIN: 3.9 g/dL (ref 3.5–5.0)
ALK PHOS: 56 U/L (ref 40–150)
ALT: 37 U/L (ref 0–55)
AST: 25 U/L (ref 5–34)
Anion Gap: 11 mEq/L (ref 3–11)
BILIRUBIN TOTAL: 0.48 mg/dL (ref 0.20–1.20)
BUN: 11.2 mg/dL (ref 7.0–26.0)
CO2: 28 meq/L (ref 22–29)
CREATININE: 0.9 mg/dL (ref 0.6–1.1)
Calcium: 10.3 mg/dL (ref 8.4–10.4)
Chloride: 102 mEq/L (ref 98–109)
EGFR: 66 mL/min/{1.73_m2} — AB (ref >90)
Glucose: 189 mg/dl — ABNORMAL HIGH (ref 70–140)
Potassium: 3.7 mEq/L (ref 3.5–5.1)
SODIUM: 141 meq/L (ref 136–145)
TOTAL PROTEIN: 7.1 g/dL (ref 6.4–8.3)

## 2015-02-20 LAB — CBC WITH DIFFERENTIAL/PLATELET
BASO%: 0.3 % (ref 0.0–2.0)
Basophils Absolute: 0 10*3/uL (ref 0.0–0.1)
EOS ABS: 0.4 10*3/uL (ref 0.0–0.5)
EOS%: 3.1 % (ref 0.0–7.0)
HCT: 42.7 % (ref 34.8–46.6)
HEMOGLOBIN: 13.5 g/dL (ref 11.6–15.9)
LYMPH%: 18.6 % (ref 14.0–49.7)
MCH: 27.8 pg (ref 25.1–34.0)
MCHC: 31.6 g/dL (ref 31.5–36.0)
MCV: 88 fL (ref 79.5–101.0)
MONO#: 1.1 10*3/uL — AB (ref 0.1–0.9)
MONO%: 8.9 % (ref 0.0–14.0)
NEUT%: 69.1 % (ref 38.4–76.8)
NEUTROS ABS: 8.2 10*3/uL — AB (ref 1.5–6.5)
PLATELETS: 242 10*3/uL (ref 145–400)
RBC: 4.85 10*6/uL (ref 3.70–5.45)
RDW: 14.9 % — AB (ref 11.2–14.5)
WBC: 11.8 10*3/uL — AB (ref 3.9–10.3)
lymph#: 2.2 10*3/uL (ref 0.9–3.3)

## 2015-02-20 NOTE — Progress Notes (Signed)
[facility] OFFICE PROGRESS NOTE  CC Dr. Mayra Neer Dr. Fanny Skates Dr. Arloa Koh Dr. Paula Compton  DIAGNOSIS: 69 year old female with invasive lobular carcinoma of the left breast in the upper-outer quadrant stage IIA.  PRIOR THERAPY:  #1 patient originally presented to the multidisciplinary breast clinic in September 2013 with new diagnosis of left invasive lobular carcinoma by ultrasound measuring 1.6 and by MRI measuring 3 cm. She had undergone a needle core biopsy the tumor was lobular ER positive.  #2 she was seen by Dr. Valere Dross as well as Dr. Fanny Skates patient was interested in breast conservation. We recommended neoadjuvant antiestrogen therapy to try to reduce the size of the tumor for a good cosmetic result at this time of lumpectomy. She went on to receive letrozole 2.5 mg daily starting in 02/05/2012 through June 2014  #3 status post partial mastectomy with sentinel lymph node biopsy of the left breast. Final pathology did reveal residual disease measuring at T1 C. N0 ER positive PR negative HER-2/neu negative. Anterior margin was positive and it was reexcised. Oncotype score 29  #4 patient completed radiation therapy to the left breast on 02/18/2013.  #4 began letrozole 2.5 mg started 02/18/2013 for a total of 5 years  CURRENT THERAPY: letrozole 2.5 mg daily  INTERVAL HISTORY: Elizabeth Hebert 69 y.o. female returns for followup visit.  She is doing very well overall. She tolerates which is a very well, no significant side effects. She denies any pain, nausea, or other symptoms. She remains fairly active at home, does not exercise regularly. She has good appetite and eating well. She got flu shot last week.   MEDICAL HISTORY: Past Medical History  Diagnosis Date  . Colitis   . Diverticulitis   . Fibroid tumor   . Anemia   . Broken arm 2011  . Hx of hernia repair   . History of cataract surgery 2012  . Anginal pain     infreq  .  Hypertension   . Myocardial infarction     stents x 2 when had mi 99  . Depression   . Diabetes mellitus without complication   . Heart murmur   . GERD (gastroesophageal reflux disease)   . H/O hiatal hernia   . Cancer     breaast  . Arthritis   . Hx of radiation therapy 01/26/13- 02/18/13    left breast 4250 cGy 17 sessions    ALLERGIES:  is allergic to vasotec.  MEDICATIONS:  Current Outpatient Prescriptions  Medication Sig Dispense Refill  . ascorbic acid (VITAMIN C) 1000 MG tablet Take 1,000 mg by mouth daily.    Marland Kitchen aspirin 81 MG chewable tablet Chew 81 mg by mouth daily.    Marland Kitchen CALCIUM-MAGNESIUM-VITAMIN D PO Take 370 mg by mouth daily.    . cimetidine (TAGAMET) 200 MG tablet Take 200 mg by mouth 2 (two) times daily.    . Cranberry 500 MG CAPS Take 1 capsule by mouth daily.    . digoxin (LANOXIN) 0.125 MG tablet Take 0.125 mg by mouth daily.    . Exenatide ER (BYDUREON) 2 MG SUSR Inject 2 mg into the skin once a week.     . ezetimibe (ZETIA) 10 MG tablet Take 10 mg by mouth daily.    . ferrous sulfate 325 (65 FE) MG tablet Take 325 mg by mouth daily with breakfast.    . glimepiride (AMARYL) 4 MG tablet Take 4 mg by mouth daily before breakfast.    .  ibandronate (BONIVA) 150 MG tablet Take 150 mg by mouth once a week.  6  . letrozole (FEMARA) 2.5 MG tablet TAKE 1 TABLET (2.5 MG TOTAL) BY MOUTH DAILY. 90 tablet 4  . metFORMIN (GLUCOPHAGE) 1000 MG tablet Take 1,000 mg by mouth 2 (two) times daily with a meal.    . metoprolol succinate (TOPROL-XL) 50 MG 24 hr tablet Take 50 mg by mouth daily. Take with or immediately following a meal.    . Multiple Vitamin (MULTIVITAMIN) capsule Take 1 capsule by mouth daily.    . nitroGLYCERIN (NITROSTAT) 0.4 MG SL tablet Place 0.4 mg under the tongue every 5 (five) minutes as needed for chest pain.     . rosuvastatin (CRESTOR) 20 MG tablet Take 20 mg by mouth daily.    . sertraline (ZOLOFT) 50 MG tablet Take 50 mg by mouth daily.    . sitaGLIPtin  (JANUVIA) 100 MG tablet Take 100 mg by mouth daily.    . valsartan-hydrochlorothiazide (DIOVAN-HCT) 320-25 MG per tablet Take 1 tablet by mouth daily.  5   No current facility-administered medications for this visit.    SURGICAL HISTORY:  Past Surgical History  Procedure Laterality Date  . Cholecystectomy    . Colon resection    . Total abdominal hysterectomy  2001  . Eye surgery Bilateral     cat   . Hernia repair      rt  . Appendectomy    . Partial mastectomy with needle localization and axillary sentinel lymph node bx Left 12/08/2012    Procedure: LEFT PARTIAL MASTECTOMY WITH NEEDLE LOCALIZATION AND AXILLARY SENTINEL LYMPH NODE BIOPSIES TIMES FOUR;  Surgeon: Adin Hector, MD;  Location: [place];  Service: General;  Laterality: Left;  Marland Kitchen Mastectomy, partial Left 12/25/2012    Procedure: MASTECTOMY PARTIAL with re-excision of margins;  Surgeon: Adin Hector, MD;  Location: [place];  Service: General;  Laterality: Left;    REVIEW OF SYSTEMS:  A 10 point review of systems was conducted and is otherwise negative except for what is noted above.    Health Maintenance  Mammogram: 11/2014, normal  Colonoscopy: none  Bone Density Scan: 02/2014, osteoporosis Pap Smear: s/p TAH/BSO 2001 Eye Exam: followed every 6 months due to diabetes Lipid Panel: followed every 6 months by PCP Vitamin D level: never   PHYSICAL EXAMINATION: Blood pressure 139/54, pulse 93, temperature 98.1 F (36.7 C), temperature source Oral, resp. rate 18, height $RemoveBe'5\' 1"'RWqOUZiHN$  (1.549 m), weight 177 lb 11.2 oz (80.604 kg), SpO2 93 %. Body mass index is 33.59 kg/(m^2). GENERAL: Patient is a well appearing female in no acute distress HEENT:  Sclerae anicteric.  Oropharynx clear and moist. No ulcerations or evidence of oropharyngeal candidiasis. Neck is supple.  NODES:  No cervical, supraclavicular, or axillary lymphadenopathy palpated.  BREAST EXAM: right breast no masses, skin changes, nodules, nipple  changes, left breast s/p lumpectomy, no nodularity, masses, or sign of recurrence.  Benign breast exam.   LUNGS:  Clear to auscultation bilaterally.  No wheezes or rhonchi. HEART:  Regular rate and rhythm. No murmur appreciated. ABDOMEN:  Soft, nontender.  Positive, normoactive bowel sounds. No organomegaly palpated. MSK:  No focal spinal tenderness to palpation. Full range of motion bilaterally in the upper extremities. EXTREMITIES:  No peripheral edema.   SKIN:  Clear with no obvious rashes or skin changes. No nail dyscrasia. NEURO:  Nonfocal. Well oriented.  Appropriate affect. ECOG PERFORMANCE STATUS: 0 - Asymptomatic  LABORATORY DATA:  CBC Latest  Ref Rng 02/20/2015 02/28/2014 08/19/2013  WBC 3.9 - 10.3 10e3/uL 11.8(H) 8.8 9.0  Hemoglobin 11.6 - 15.9 g/dL 13.5 13.3 13.2  Hematocrit 34.8 - 46.6 % 42.7 41.6 40.9  Platelets 145 - 400 10e3/uL 242 209 188    CMP Latest Ref Rng 02/20/2015 02/28/2014 08/19/2013  Glucose 70 - 140 mg/dl 189(H) 224(H) 114  BUN 7.0 - 26.0 mg/dL 11.2 7.5 5.4(L)  Creatinine 0.6 - 1.1 mg/dL 0.9 0.9 0.7  Sodium 136 - 145 mEq/L 141 142 143  Potassium 3.5 - 5.1 mEq/L 3.7 3.7 3.7  Chloride 96 - 112 mEq/L - - -  CO2 22 - 29 mEq/L $Remove'28 26 26  'BCVkrhh$ Calcium 8.4 - 10.4 mg/dL 10.3 9.7 9.7  Total Protein 6.4 - 8.3 g/dL 7.1 7.4 6.8  Total Bilirubin 0.20 - 1.20 mg/dL 0.48 0.51 0.49  Alkaline Phos 40 - 150 U/L 56 72 57  AST 5 - 34 U/L 25 31 32  ALT 0 - 55 U/L 37 39 41   Bilateral diagnostic mammogram 11/09/2014 IMPRESSION: No findings worrisome for recurrent tumor or developing malignancy.   ASSESSMENT: 69 year old female with  1. Clinical stage II invasive lobular carcinoma of left breast, s/p neoadjuvant endocrine therapy, pTI CN0, ER +70% PR negative HER-2/neu negative. -She is doing clinically very well. Exam and mammogram 3 month ago showed no evidence of recurrence -Lab reviewed, CBC and CMP within normal limits except hyperglycemia -She will continue letrozole once  daily, for total 5 years, until September 2019 -Continue yearly screening mammogram. -We discussed healthy diet and exercise, I encouraged her continue to be physically active.  2. Osteoporosis -She was on prolia, switch to Boniva by her primary care physician Dr. Brigitte Pulse  -Repeat bone density scan every 2 years. -We discussed letrozole may worse her osteoporosis. I encouraged her to continue calcium and vitamin D and regular exercise.  3. She will continue to follow up with Dr. Brigitte Pulse for her DM, HTN, CAD and other medical issues   I spent 15 minutes counseling the patient face to face. The total time spent in the appointment was 20 minutes.  Plan RTC in 6 month with lab    Truitt Merle  02/20/2015

## 2015-02-20 NOTE — Telephone Encounter (Signed)
per pof to sch pt appt-gave pt copy of avs °

## 2015-02-22 DIAGNOSIS — R350 Frequency of micturition: Secondary | ICD-10-CM | POA: Diagnosis not present

## 2015-04-17 ENCOUNTER — Other Ambulatory Visit: Payer: Self-pay | Admitting: Family Medicine

## 2015-04-17 DIAGNOSIS — Z0000 Encounter for general adult medical examination without abnormal findings: Secondary | ICD-10-CM | POA: Diagnosis not present

## 2015-04-17 DIAGNOSIS — R300 Dysuria: Secondary | ICD-10-CM | POA: Diagnosis not present

## 2015-04-17 DIAGNOSIS — R1013 Epigastric pain: Secondary | ICD-10-CM

## 2015-04-17 DIAGNOSIS — E11319 Type 2 diabetes mellitus with unspecified diabetic retinopathy without macular edema: Secondary | ICD-10-CM | POA: Diagnosis not present

## 2015-04-17 DIAGNOSIS — F322 Major depressive disorder, single episode, severe without psychotic features: Secondary | ICD-10-CM | POA: Diagnosis not present

## 2015-04-17 DIAGNOSIS — I2510 Atherosclerotic heart disease of native coronary artery without angina pectoris: Secondary | ICD-10-CM | POA: Diagnosis not present

## 2015-04-17 DIAGNOSIS — E7800 Pure hypercholesterolemia, unspecified: Secondary | ICD-10-CM | POA: Diagnosis not present

## 2015-04-17 DIAGNOSIS — I119 Hypertensive heart disease without heart failure: Secondary | ICD-10-CM | POA: Diagnosis not present

## 2015-04-17 DIAGNOSIS — M810 Age-related osteoporosis without current pathological fracture: Secondary | ICD-10-CM | POA: Diagnosis not present

## 2015-04-17 DIAGNOSIS — K219 Gastro-esophageal reflux disease without esophagitis: Secondary | ICD-10-CM | POA: Diagnosis not present

## 2015-04-17 DIAGNOSIS — C50919 Malignant neoplasm of unspecified site of unspecified female breast: Secondary | ICD-10-CM | POA: Diagnosis not present

## 2015-04-17 DIAGNOSIS — E669 Obesity, unspecified: Secondary | ICD-10-CM | POA: Diagnosis not present

## 2015-04-17 DIAGNOSIS — R109 Unspecified abdominal pain: Secondary | ICD-10-CM | POA: Diagnosis not present

## 2015-04-24 ENCOUNTER — Ambulatory Visit
Admission: RE | Admit: 2015-04-24 | Discharge: 2015-04-24 | Disposition: A | Discharge status: Home / Self Care | Payer: Medicare Other | Source: Ambulatory Visit | Attending: Family Medicine | Admitting: Family Medicine

## 2015-04-24 DIAGNOSIS — R1013 Epigastric pain: Secondary | ICD-10-CM

## 2015-04-24 MED ORDER — IOPAMIDOL (ISOVUE-300) INJECTION 61%
100.0000 mL | Freq: Once | INTRAVENOUS | Status: AC | PRN
  Administered 2015-04-24: 100 mL via INTRAVENOUS

## 2015-04-25 DIAGNOSIS — N179 Acute kidney failure, unspecified: Secondary | ICD-10-CM | POA: Diagnosis not present

## 2015-04-25 DIAGNOSIS — N183 Chronic kidney disease, stage 3 (moderate): Secondary | ICD-10-CM | POA: Diagnosis not present

## 2015-05-12 ENCOUNTER — Other Ambulatory Visit: Payer: Self-pay | Admitting: Hematology

## 2015-08-21 ENCOUNTER — Other Ambulatory Visit (HOSPITAL_BASED_OUTPATIENT_CLINIC_OR_DEPARTMENT_OTHER): Payer: Medicare Other

## 2015-08-21 ENCOUNTER — Telehealth: Payer: Self-pay | Admitting: Hematology

## 2015-08-21 ENCOUNTER — Ambulatory Visit (HOSPITAL_BASED_OUTPATIENT_CLINIC_OR_DEPARTMENT_OTHER): Payer: Medicare Other | Admitting: Hematology

## 2015-08-21 ENCOUNTER — Encounter: Payer: Self-pay | Admitting: Hematology

## 2015-08-21 VITALS — BP 158/64 | HR 71 | Temp 98.0°F | Resp 17 | Ht 61.0 in | Wt 178.2 lb

## 2015-08-21 DIAGNOSIS — C50412 Malignant neoplasm of upper-outer quadrant of left female breast: Secondary | ICD-10-CM | POA: Diagnosis not present

## 2015-08-21 DIAGNOSIS — M810 Age-related osteoporosis without current pathological fracture: Secondary | ICD-10-CM

## 2015-08-21 DIAGNOSIS — Z170 Estrogen receptor positive status [ER+]: Secondary | ICD-10-CM | POA: Diagnosis not present

## 2015-08-21 DIAGNOSIS — C50419 Malignant neoplasm of upper-outer quadrant of unspecified female breast: Secondary | ICD-10-CM

## 2015-08-21 LAB — CBC WITH DIFFERENTIAL/PLATELET
BASO%: 0.6 % (ref 0.0–2.0)
BASOS ABS: 0.1 10*3/uL (ref 0.0–0.1)
EOS%: 6.5 % (ref 0.0–7.0)
Eosinophils Absolute: 0.6 10*3/uL — ABNORMAL HIGH (ref 0.0–0.5)
HEMATOCRIT: 41.4 % (ref 34.8–46.6)
HGB: 13.4 g/dL (ref 11.6–15.9)
LYMPH%: 18.9 % (ref 14.0–49.7)
MCH: 27.7 pg (ref 25.1–34.0)
MCHC: 32.4 g/dL (ref 31.5–36.0)
MCV: 85.7 fL (ref 79.5–101.0)
MONO#: 0.7 10*3/uL (ref 0.1–0.9)
MONO%: 7.9 % (ref 0.0–14.0)
NEUT#: 6 10*3/uL (ref 1.5–6.5)
NEUT%: 66.1 % (ref 38.4–76.8)
Platelets: 227 10*3/uL (ref 145–400)
RBC: 4.83 10*6/uL (ref 3.70–5.45)
RDW: 14.2 % (ref 11.2–14.5)
WBC: 9.1 10*3/uL (ref 3.9–10.3)
lymph#: 1.7 10*3/uL (ref 0.9–3.3)

## 2015-08-21 LAB — COMPREHENSIVE METABOLIC PANEL
ALK PHOS: 50 U/L (ref 40–150)
ALT: 41 U/L (ref 0–55)
AST: 33 U/L (ref 5–34)
Albumin: 3.7 g/dL (ref 3.5–5.0)
Anion Gap: 10 mEq/L (ref 3–11)
BUN: 8.6 mg/dL (ref 7.0–26.0)
CO2: 31 meq/L — AB (ref 22–29)
CREATININE: 0.9 mg/dL (ref 0.6–1.1)
Calcium: 9.8 mg/dL (ref 8.4–10.4)
Chloride: 101 mEq/L (ref 98–109)
EGFR: 62 mL/min/{1.73_m2} — AB (ref >90)
GLUCOSE: 154 mg/dL — AB (ref 70–140)
Potassium: 3.9 mEq/L (ref 3.5–5.1)
SODIUM: 141 meq/L (ref 136–145)
TOTAL PROTEIN: 7 g/dL (ref 6.4–8.3)
Total Bilirubin: 0.56 mg/dL (ref 0.20–1.20)

## 2015-08-21 NOTE — Telephone Encounter (Signed)
per pfo to sch pt appt-pt to make mamma appt-gave pt copy of avs

## 2015-08-21 NOTE — Progress Notes (Signed)
[facility] OFFICE PROGRESS NOTE  CC Dr. Mayra Neer Dr. Fanny Skates Dr. Arloa Koh Dr. Paula Compton  DIAGNOSIS: 70 year old female with invasive lobular carcinoma of the left breast in the upper-outer quadrant stage IIA.  PRIOR THERAPY:  #1 patient originally presented to the multidisciplinary breast clinic in September 2013 with new diagnosis of left invasive lobular carcinoma by ultrasound measuring 1.6 and by MRI measuring 3 cm. She had undergone a needle core biopsy the tumor was lobular ER positive.  #2 she was seen by Dr. Valere Dross as well as Dr. Fanny Skates patient was interested in breast conservation. We recommended neoadjuvant antiestrogen therapy to try to reduce the size of the tumor for a good cosmetic result at this time of lumpectomy. She went on to receive letrozole 2.5 mg daily starting in 02/05/2012 through June 2014  #3 status post partial mastectomy with sentinel lymph node biopsy of the left breast. Final pathology did reveal residual disease measuring at T1 C. N0 ER positive PR negative HER-2/neu negative. Anterior margin was positive and it was reexcised. Oncotype score 29  #4 patient completed radiation therapy to the left breast on 02/18/2013.  #4 began letrozole 2.5 mg started 02/18/2013 for a total of 5 years  CURRENT THERAPY: letrozole 2.5 mg daily  INTERVAL HISTORY: Elizabeth Hebert 70 y.o. female returns for followup visit.  She is doing very well overall. Her daughter was diagnosed with breast cancer a few weeks ago, and is  Scheduled to have lumpectomy,  And will see [place] in our cancer center.  Her daughter is quite distressed about,  Patient and her husband has been taking care of their daughter's son who is 87.  She otherwise is doing well, mild hot flash, tolerable, no other side effects. She is very compliant with letrozole.   MEDICAL HISTORY: Past Medical History  Diagnosis Date  . Colitis   . Diverticulitis   .  Fibroid tumor   . Anemia   . Broken arm 2011  . Hx of hernia repair   . History of cataract surgery 2012  . Anginal pain ([place])     infreq  . Hypertension   . Myocardial infarction ([place])     stents x 2 when had mi 99  . Depression   . Diabetes mellitus without complication ([place])   . Heart murmur   . GERD (gastroesophageal reflux disease)   . H/O hiatal hernia   . Cancer ([facility])     breaast  . Arthritis   . Hx of radiation therapy 01/26/13- 02/18/13    left breast 4250 cGy 17 sessions    ALLERGIES:  is allergic to vasotec.  MEDICATIONS:  Current Outpatient Prescriptions  Medication Sig Dispense Refill  . aspirin 81 MG chewable tablet Chew 81 mg by mouth daily.    Marland Kitchen CALCIUM-MAGNESIUM-VITAMIN D PO Take 370 mg by mouth daily.    . cimetidine (TAGAMET) 200 MG tablet Take 200 mg by mouth 2 (two) times daily.    . Cranberry 500 MG CAPS Take 1 capsule by mouth daily.    . digoxin (LANOXIN) 0.125 MG tablet Take 0.125 mg by mouth daily.    . Exenatide ER (BYDUREON) 2 MG SUSR Inject 2 mg into the skin once a week.     . ezetimibe (ZETIA) 10 MG tablet Take 10 mg by mouth daily.    . ferrous sulfate 325 (65 FE) MG tablet Take 325 mg by mouth daily with breakfast.    . glimepiride (  AMARYL) 4 MG tablet Take 4 mg by mouth daily before breakfast.    . ibandronate (BONIVA) 150 MG tablet Take 150 mg by mouth once a week.  6  . letrozole (FEMARA) 2.5 MG tablet TAKE 1 TABLET (2.5 MG TOTAL) BY MOUTH DAILY. 90 tablet 3  . metFORMIN (GLUCOPHAGE) 1000 MG tablet Take 1,000 mg by mouth 2 (two) times daily with a meal.    . metoprolol succinate (TOPROL-XL) 50 MG 24 hr tablet Take 50 mg by mouth daily. Take with or immediately following a meal.    . Multiple Vitamin (MULTIVITAMIN) capsule Take 1 capsule by mouth daily.    . nitroGLYCERIN (NITROSTAT) 0.4 MG SL tablet Place 0.4 mg under the tongue every 5 (five) minutes as needed for chest pain.     . rosuvastatin (CRESTOR) 20 MG tablet Take 20 mg by mouth  daily.    . sitaGLIPtin (JANUVIA) 100 MG tablet Take 100 mg by mouth daily.    . valsartan-hydrochlorothiazide (DIOVAN-HCT) 320-25 MG per tablet Take 1 tablet by mouth daily.  5  . sertraline (ZOLOFT) 50 MG tablet Take 50 mg by mouth daily.     No current facility-administered medications for this visit.    SURGICAL HISTORY:  Past Surgical History  Procedure Laterality Date  . Cholecystectomy    . Colon resection    . Total abdominal hysterectomy  2001  . Eye surgery Bilateral     cat   . Hernia repair      rt  . Appendectomy    . Partial mastectomy with needle localization and axillary sentinel lymph node bx Left 12/08/2012    Procedure: LEFT PARTIAL MASTECTOMY WITH NEEDLE LOCALIZATION AND AXILLARY SENTINEL LYMPH NODE BIOPSIES TIMES FOUR;  Surgeon: Ernestene Mention, MD;  Location: [facility];  Service: General;  Laterality: Left;  Marland Kitchen Mastectomy, partial Left 12/25/2012    Procedure: MASTECTOMY PARTIAL with re-excision of margins;  Surgeon: Ernestene Mention, MD;  Location: [facility];  Service: General;  Laterality: Left;    REVIEW OF SYSTEMS:  A 10 point review of systems was conducted and is otherwise negative except for what is noted above.    Health Maintenance  Mammogram: 11/2014, normal  Colonoscopy: none  Bone Density Scan: 02/2014, osteoporosis Pap Smear: s/p TAH/BSO 2001 Eye Exam: followed every 6 months due to diabetes Lipid Panel: followed every 6 months by PCP Vitamin D level: never   PHYSICAL EXAMINATION: Blood pressure 158/64, pulse 71, temperature 98 F (36.7 C), temperature source Oral, resp. rate 17, height 5\' 1"  (1.549 m), weight 178 lb 3.2 oz (80.831 kg), SpO2 98 %. Body mass index is 33.69 kg/(m^2). GENERAL: Patient is a well appearing female in no acute distress HEENT:  Sclerae anicteric.  Oropharynx clear and moist. No ulcerations or evidence of oropharyngeal candidiasis. Neck is supple.  NODES:  No cervical, supraclavicular, or axillary  lymphadenopathy palpated.  BREAST EXAM: right breast no masses, skin changes, nodules, nipple changes, left breast s/p lumpectomy, no nodularity, masses, or sign of recurrence.  Benign breast exam.   LUNGS:  Clear to auscultation bilaterally.  No wheezes or rhonchi. HEART:  Regular rate and rhythm. No murmur appreciated. ABDOMEN:  Soft, nontender.  Positive, normoactive bowel sounds. No organomegaly palpated. MSK:  No focal spinal tenderness to palpation. Full range of motion bilaterally in the upper extremities. EXTREMITIES:  No peripheral edema.   SKIN:  Clear with no obvious rashes or skin changes. No nail dyscrasia. NEURO:  Nonfocal. Well  oriented.  Appropriate affect. ECOG PERFORMANCE STATUS: 0 - Asymptomatic  LABORATORY DATA:  CBC Latest Ref Rng 08/21/2015 02/20/2015 02/28/2014  WBC 3.9 - 10.3 10e3/uL 9.1 11.8(H) 8.8  Hemoglobin 11.6 - 15.9 g/dL 13.4 13.5 13.3  Hematocrit 34.8 - 46.6 % 41.4 42.7 41.6  Platelets 145 - 400 10e3/uL 227 242 209    CMP Latest Ref Rng 08/21/2015 02/20/2015 02/28/2014  Glucose 70 - 140 mg/dl 154(H) 189(H) 224(H)  BUN 7.0 - 26.0 mg/dL 8.6 11.2 7.5  Creatinine 0.6 - 1.1 mg/dL 0.9 0.9 0.9  Sodium 136 - 145 mEq/L 141 141 142  Potassium 3.5 - 5.1 mEq/L 3.9 3.7 3.7  CO2 22 - 29 mEq/L 31(H) 28 26  Calcium 8.4 - 10.4 mg/dL 9.8 10.3 9.7  Total Protein 6.4 - 8.3 g/dL 7.0 7.1 7.4  Total Bilirubin 0.20 - 1.20 mg/dL 0.56 0.48 0.51  Alkaline Phos 40 - 150 U/L 50 56 72  AST 5 - 34 U/L 33 25 31  ALT 0 - 55 U/L 41 37 39   Bilateral diagnostic mammogram 11/09/2014 IMPRESSION: No findings worrisome for recurrent tumor or developing malignancy.   ASSESSMENT: 70 year old female with  1. Clinical stage II invasive lobular carcinoma of left breast, s/p neoadjuvant endocrine therapy, pTI CN0, ER +70% PR negative HER-2/neu negative. -She is doing clinically very well. Exam and last mammogram showed no evidence of recurrence -Lab reviewed, CBC and CMP within normal limits  except hyperglycemia -She will continue letrozole once daily, for total 5 years, until September 2019 -Continue yearly screening mammogram, due in June  -We discussed healthy diet and exercise, I encouraged her continue to be physically active.  2. Osteoporosis -She was on prolia, switch to Boniva by her primary care physician Dr. Brigitte Pulse  -Repeat bone density scan every 2 years. -We discussed letrozole may worse her osteoporosis. I encouraged her to continue calcium and vitamin D and regular exercise.  3. She will continue to follow up with Dr. Brigitte Pulse for her DM, HTN, CAD and other medical issues   I spent 15 minutes counseling the patient face to face. The total time spent in the appointment was 20 minutes.  Plan RTC in 6 month with lab,  Diagnostic mammogram in 3 months   Elizabeth Hebert  08/21/2015

## 2015-08-21 NOTE — Telephone Encounter (Signed)
sent Dr Burr Medico email to add mamma order willc all pt after reply

## 2015-09-06 DIAGNOSIS — J069 Acute upper respiratory infection, unspecified: Secondary | ICD-10-CM | POA: Diagnosis not present

## 2015-10-20 DIAGNOSIS — C50919 Malignant neoplasm of unspecified site of unspecified female breast: Secondary | ICD-10-CM | POA: Diagnosis not present

## 2015-10-20 DIAGNOSIS — Z6833 Body mass index (BMI) 33.0-33.9, adult: Secondary | ICD-10-CM | POA: Diagnosis not present

## 2015-10-20 DIAGNOSIS — E7800 Pure hypercholesterolemia, unspecified: Secondary | ICD-10-CM | POA: Diagnosis not present

## 2015-10-20 DIAGNOSIS — E11319 Type 2 diabetes mellitus with unspecified diabetic retinopathy without macular edema: Secondary | ICD-10-CM | POA: Diagnosis not present

## 2015-10-20 DIAGNOSIS — I2510 Atherosclerotic heart disease of native coronary artery without angina pectoris: Secondary | ICD-10-CM | POA: Diagnosis not present

## 2015-10-20 DIAGNOSIS — F322 Major depressive disorder, single episode, severe without psychotic features: Secondary | ICD-10-CM | POA: Diagnosis not present

## 2015-10-20 DIAGNOSIS — E669 Obesity, unspecified: Secondary | ICD-10-CM | POA: Diagnosis not present

## 2015-10-20 DIAGNOSIS — I700 Atherosclerosis of aorta: Secondary | ICD-10-CM | POA: Diagnosis not present

## 2015-10-20 DIAGNOSIS — I119 Hypertensive heart disease without heart failure: Secondary | ICD-10-CM | POA: Diagnosis not present

## 2015-11-07 DIAGNOSIS — J069 Acute upper respiratory infection, unspecified: Secondary | ICD-10-CM | POA: Diagnosis not present

## 2015-11-10 ENCOUNTER — Ambulatory Visit
Admission: RE | Admit: 2015-11-10 | Discharge: 2015-11-10 | Disposition: A | Discharge status: Home / Self Care | Payer: Medicare Other | Source: Ambulatory Visit | Attending: Hematology | Admitting: Hematology

## 2015-11-10 DIAGNOSIS — R922 Inconclusive mammogram: Secondary | ICD-10-CM | POA: Diagnosis not present

## 2015-11-10 DIAGNOSIS — C50412 Malignant neoplasm of upper-outer quadrant of left female breast: Secondary | ICD-10-CM

## 2015-11-14 DIAGNOSIS — H109 Unspecified conjunctivitis: Secondary | ICD-10-CM | POA: Diagnosis not present

## 2016-01-29 DIAGNOSIS — H26493 Other secondary cataract, bilateral: Secondary | ICD-10-CM | POA: Diagnosis not present

## 2016-01-29 DIAGNOSIS — H524 Presbyopia: Secondary | ICD-10-CM | POA: Diagnosis not present

## 2016-01-29 DIAGNOSIS — E113291 Type 2 diabetes mellitus with mild nonproliferative diabetic retinopathy without macular edema, right eye: Secondary | ICD-10-CM | POA: Diagnosis not present

## 2016-02-21 ENCOUNTER — Encounter: Payer: Medicare Other | Admitting: Hematology

## 2016-02-21 ENCOUNTER — Other Ambulatory Visit: Payer: Medicare Other

## 2016-02-21 NOTE — Progress Notes (Signed)
No show  This encounter was created in error - please disregard.

## 2016-02-22 ENCOUNTER — Telehealth: Payer: Self-pay | Admitting: Hematology

## 2016-02-22 DIAGNOSIS — H26491 Other secondary cataract, right eye: Secondary | ICD-10-CM | POA: Diagnosis not present

## 2016-02-22 NOTE — Telephone Encounter (Signed)
09/20 Appointment rescheduled to 10/11 per patient request. Patient requested AM appointment.

## 2016-03-13 ENCOUNTER — Other Ambulatory Visit (HOSPITAL_BASED_OUTPATIENT_CLINIC_OR_DEPARTMENT_OTHER): Payer: Medicare Other

## 2016-03-13 ENCOUNTER — Telehealth: Payer: Self-pay | Admitting: Hematology

## 2016-03-13 ENCOUNTER — Ambulatory Visit (HOSPITAL_BASED_OUTPATIENT_CLINIC_OR_DEPARTMENT_OTHER): Payer: Medicare Other | Admitting: Hematology

## 2016-03-13 VITALS — BP 138/60 | HR 76 | Temp 98.6°F | Resp 18 | Ht 61.0 in | Wt 174.8 lb

## 2016-03-13 DIAGNOSIS — Z170 Estrogen receptor positive status [ER+]: Secondary | ICD-10-CM | POA: Diagnosis not present

## 2016-03-13 DIAGNOSIS — Z79811 Long term (current) use of aromatase inhibitors: Secondary | ICD-10-CM

## 2016-03-13 DIAGNOSIS — M810 Age-related osteoporosis without current pathological fracture: Secondary | ICD-10-CM | POA: Diagnosis not present

## 2016-03-13 DIAGNOSIS — C50412 Malignant neoplasm of upper-outer quadrant of left female breast: Secondary | ICD-10-CM

## 2016-03-13 DIAGNOSIS — Z23 Encounter for immunization: Secondary | ICD-10-CM

## 2016-03-13 LAB — COMPREHENSIVE METABOLIC PANEL
ALBUMIN: 3.6 g/dL (ref 3.5–5.0)
ALK PHOS: 57 U/L (ref 40–150)
ALT: 25 U/L (ref 0–55)
ANION GAP: 13 meq/L — AB (ref 3–11)
AST: 25 U/L (ref 5–34)
BILIRUBIN TOTAL: 0.55 mg/dL (ref 0.20–1.20)
BUN: 9.9 mg/dL (ref 7.0–26.0)
CALCIUM: 9.9 mg/dL (ref 8.4–10.4)
CHLORIDE: 101 meq/L (ref 98–109)
CO2: 27 mEq/L (ref 22–29)
CREATININE: 1 mg/dL (ref 0.6–1.1)
EGFR: 56 mL/min/{1.73_m2} — ABNORMAL LOW (ref >90)
Glucose: 229 mg/dl — ABNORMAL HIGH (ref 70–140)
Potassium: 3.4 mEq/L — ABNORMAL LOW (ref 3.5–5.1)
Sodium: 142 mEq/L (ref 136–145)
TOTAL PROTEIN: 7 g/dL (ref 6.4–8.3)

## 2016-03-13 LAB — CBC WITH DIFFERENTIAL/PLATELET
BASO%: 0.9 % (ref 0.0–2.0)
Basophils Absolute: 0.1 10*3/uL (ref 0.0–0.1)
EOS%: 4.5 % (ref 0.0–7.0)
Eosinophils Absolute: 0.4 10*3/uL (ref 0.0–0.5)
HEMATOCRIT: 42.3 % (ref 34.8–46.6)
HEMOGLOBIN: 13.7 g/dL (ref 11.6–15.9)
LYMPH#: 1.7 10*3/uL (ref 0.9–3.3)
LYMPH%: 17.9 % (ref 14.0–49.7)
MCH: 27.6 pg (ref 25.1–34.0)
MCHC: 32.5 g/dL (ref 31.5–36.0)
MCV: 84.9 fL (ref 79.5–101.0)
MONO#: 0.9 10*3/uL (ref 0.1–0.9)
MONO%: 9.1 % (ref 0.0–14.0)
NEUT%: 67.6 % (ref 38.4–76.8)
NEUTROS ABS: 6.4 10*3/uL (ref 1.5–6.5)
PLATELETS: 233 10*3/uL (ref 145–400)
RBC: 4.98 10*6/uL (ref 3.70–5.45)
RDW: 15.1 % — ABNORMAL HIGH (ref 11.2–14.5)
WBC: 9.5 10*3/uL (ref 3.9–10.3)

## 2016-03-13 MED ORDER — LETROZOLE 2.5 MG PO TABS: ORAL_TABLET | ORAL | 3 refills | Status: DC

## 2016-03-13 MED ORDER — INFLUENZA VAC SPLIT QUAD 0.5 ML IM SUSY
0.5000 mL | PREFILLED_SYRINGE | Freq: Once | INTRAMUSCULAR | Status: AC
  Administered 2016-03-13: 0.5 mL via INTRAMUSCULAR
  Filled 2016-03-13: qty 0.5

## 2016-03-13 NOTE — Progress Notes (Signed)
[facility] OFFICE PROGRESS NOTE  CC Dr. Mayra Neer Dr. Fanny Skates Dr. Arloa Koh Dr. Paula Compton  DIAGNOSIS: 70 year old female with invasive lobular carcinoma of the left breast in the upper-outer quadrant stage IIA.  PRIOR THERAPY:  #1 patient originally presented to the multidisciplinary breast clinic in September 2013 with new diagnosis of left invasive lobular carcinoma by ultrasound measuring 1.6 and by MRI measuring 3 cm. She had undergone a needle core biopsy the tumor was lobular ER positive.  #2 she was seen by Dr. Valere Dross as well as Dr. Fanny Skates patient was interested in breast conservation. We recommended neoadjuvant antiestrogen therapy to try to reduce the size of the tumor for a good cosmetic result at this time of lumpectomy. She went on to receive letrozole 2.5 mg daily starting in 02/05/2012 through June 2014  #3 status post partial mastectomy with sentinel lymph node biopsy of the left breast. Final pathology did reveal residual disease measuring at T1 C. N0 ER positive PR negative HER-2/neu negative. Anterior margin was positive and it was reexcised. Oncotype score 29  #4 patient completed radiation therapy to the left breast on 02/18/2013.  #4 began letrozole 2.5 mg started 02/18/2013 for a total of 5 years  CURRENT THERAPY: letrozole 2.5 mg daily  INTERVAL HISTORY: Elizabeth Hebert 70 y.o. female returns for followup visit.  She is doing very well overall. She has moderate hot flush , especially at night, which wakes her up some time, but manageable overall, no other complains. She has good appetite and energy level, functions very well at home.    MEDICAL HISTORY: Past Medical History:  Diagnosis Date  . Anemia   . Anginal pain ([place])    infreq  . Arthritis   . Broken arm 2011  . Cancer ([place])    breaast  . Colitis   . Depression   . Diabetes mellitus without complication ([place])   . Diverticulitis   . Fibroid tumor    . GERD (gastroesophageal reflux disease)   . H/O hiatal hernia   . Heart murmur   . History of cataract surgery 2012  . Hx of hernia repair   . Hx of radiation therapy 01/26/13- 02/18/13   left breast 4250 cGy 17 sessions  . Hypertension   . Myocardial infarction    stents x 2 when had mi 65    ALLERGIES:  is allergic to vasotec [enalaprilat].  MEDICATIONS:  Current Outpatient Prescriptions  Medication Sig Dispense Refill  . aspirin 81 MG chewable tablet Chew 81 mg by mouth daily.    Marland Kitchen CALCIUM-MAGNESIUM-VITAMIN D PO Take 370 mg by mouth daily.    . cimetidine (TAGAMET) 200 MG tablet Take 200 mg by mouth 2 (two) times daily.    . Cranberry 500 MG CAPS Take 1 capsule by mouth daily.    . digoxin (LANOXIN) 0.125 MG tablet Take 0.125 mg by mouth daily.    . Exenatide ER (BYDUREON) 2 MG SUSR Inject 2 mg into the skin once a week.     . ezetimibe (ZETIA) 10 MG tablet Take 10 mg by mouth daily.    . ferrous sulfate 325 (65 FE) MG tablet Take 325 mg by mouth daily with breakfast.    . glimepiride (AMARYL) 4 MG tablet Take 4 mg by mouth daily before breakfast.    . ibandronate (BONIVA) 150 MG tablet Take 150 mg by mouth once a week.  6  . letrozole (FEMARA) 2.5 MG tablet TAKE 1  TABLET (2.5 MG TOTAL) BY MOUTH DAILY. 90 tablet 3  . metFORMIN (GLUCOPHAGE) 1000 MG tablet Take 1,000 mg by mouth 2 (two) times daily with a meal.    . metoprolol succinate (TOPROL-XL) 50 MG 24 hr tablet Take 50 mg by mouth daily. Take with or immediately following a meal.    . Multiple Vitamin (MULTIVITAMIN) capsule Take 1 capsule by mouth daily.    . nitroGLYCERIN (NITROSTAT) 0.4 MG SL tablet Place 0.4 mg under the tongue every 5 (five) minutes as needed for chest pain.     . rosuvastatin (CRESTOR) 20 MG tablet Take 20 mg by mouth daily.    . sertraline (ZOLOFT) 50 MG tablet Take 50 mg by mouth daily.    . sitaGLIPtin (JANUVIA) 100 MG tablet Take 100 mg by mouth daily.    . valsartan-hydrochlorothiazide  (DIOVAN-HCT) 320-25 MG per tablet Take 1 tablet by mouth daily.  5   No current facility-administered medications for this visit.     SURGICAL HISTORY:  Past Surgical History:  Procedure Laterality Date  . APPENDECTOMY    . CHOLECYSTECTOMY    . COLON RESECTION    . EYE SURGERY Bilateral    cat   . HERNIA REPAIR     rt  . MASTECTOMY, PARTIAL Left 12/25/2012   Procedure: MASTECTOMY PARTIAL with re-excision of margins;  Surgeon: Adin Hector, MD;  Location: [facility];  Service: General;  Laterality: Left;  . PARTIAL MASTECTOMY WITH NEEDLE LOCALIZATION AND AXILLARY SENTINEL LYMPH NODE BX Left 12/08/2012   Procedure: LEFT PARTIAL MASTECTOMY WITH NEEDLE LOCALIZATION AND AXILLARY SENTINEL LYMPH NODE BIOPSIES TIMES FOUR;  Surgeon: Adin Hector, MD;  Location: [facility];  Service: General;  Laterality: Left;  . TOTAL ABDOMINAL HYSTERECTOMY  2001    REVIEW OF SYSTEMS:  A 10 point review of systems was conducted and is otherwise negative except for what is noted above.    Health Maintenance  Mammogram: 11/2015, normal  Colonoscopy: none  Bone Density Scan: 02/2014, osteoporosis Pap Smear: s/p TAH/BSO 2001 Eye Exam: followed every 6 months due to diabetes Lipid Panel: followed every 6 months by PCP Vitamin D level: never   PHYSICAL EXAMINATION: Blood pressure 138/60, pulse 76, temperature 98.6 F (37 C), temperature source Oral, resp. rate 18, height '5\' 1"'$  (1.549 m), weight 174 lb 12.8 oz (79.3 kg), SpO2 96 %. Body mass index is 33.03 kg/m. GENERAL: Patient is a well appearing female in no acute distress HEENT:  Sclerae anicteric.  Oropharynx clear and moist. No ulcerations or evidence of oropharyngeal candidiasis. Neck is supple.  NODES:  No cervical, supraclavicular, or axillary lymphadenopathy palpated.  BREAST EXAM: right breast no masses, skin changes, nodules, nipple changes, left breast s/p lumpectomy, no nodularity, masses, or sign of recurrence.  Benign breast  exam.   LUNGS:  Clear to auscultation bilaterally.  No wheezes or rhonchi. HEART:  Regular rate and rhythm. No murmur appreciated. ABDOMEN:  Soft, nontender.  Positive, normoactive bowel sounds. No organomegaly palpated. MSK:  No focal spinal tenderness to palpation. Full range of motion bilaterally in the upper extremities. EXTREMITIES:  No peripheral edema.   SKIN:  Clear with no obvious rashes or skin changes. No nail dyscrasia. NEURO:  Nonfocal. Well oriented.  Appropriate affect. ECOG PERFORMANCE STATUS: 0 - Asymptomatic  LABORATORY DATA:  CBC Latest Ref Rng & Units 03/13/2016 08/21/2015 02/20/2015  WBC 3.9 - 10.3 10e3/uL 9.5 9.1 11.8(H)  Hemoglobin 11.6 - 15.9 g/dL 13.7 13.4 13.5  Hematocrit  34.8 - 46.6 % 42.3 41.4 42.7  Platelets 145 - 400 10e3/uL 233 227 242    CMP Latest Ref Rng & Units 03/13/2016 08/21/2015 02/20/2015  Glucose 70 - 140 mg/dl 229(H) 154(H) 189(H)  BUN 7.0 - 26.0 mg/dL 9.9 8.6 11.2  Creatinine 0.6 - 1.1 mg/dL 1.0 0.9 0.9  Sodium 136 - 145 mEq/L 142 141 141  Potassium 3.5 - 5.1 mEq/L 3.4(L) 3.9 3.7  Chloride 96 - 112 mEq/L - - -  CO2 22 - 29 mEq/L 27 31(H) 28  Calcium 8.4 - 10.4 mg/dL 9.9 9.8 10.3  Total Protein 6.4 - 8.3 g/dL 7.0 7.0 7.1  Total Bilirubin 0.20 - 1.20 mg/dL 0.55 0.56 0.48  Alkaline Phos 40 - 150 U/L 57 50 56  AST 5 - 34 U/L 25 33 25  ALT 0 - 55 U/L 25 41 37   Bilateral diagnostic mammogram 11/10/2015 IMPRESSION: No evidence of malignancy in either breast.  RECOMMENDATION: Bilateral diagnostic mammogram in 1 year is recommended.  ASSESSMENT: 70 year old female with  1. Clinical stage II invasive lobular carcinoma of left breast, s/p neoadjuvant endocrine therapy, pTI CN0, ER +70% PR negative HER-2/neu negative. -She is doing clinically very well. Exam and last mammogram showed no evidence of recurrence -Lab reviewed, CBC and CMP within normal limits except hyperglycemia -She will continue letrozole once daily, for total 5 years, until  September 2019 -Continue yearly screening mammogram, due in June  -We discussed healthy diet and exercise, I encouraged her continue to be physically active.  2. Osteoporosis -She was on prolia, switch to Boniva by her primary care physician Dr. Brigitte Pulse  -Repeat bone density scan every 2 years, she is due now  -We discussed letrozole may worse her osteoporosis. I encouraged her to continue calcium and vitamin D and regular exercise.  3. She will continue to follow up with Dr. Brigitte Pulse for her DM, HTN, CAD and other medical issues   I spent 15 minutes counseling the patient face to face. The total time spent in the appointment was 20 minutes.  Plan RTC in 6 month with lab,  DEXA within next month    Truitt Merle  03/13/2016

## 2016-03-13 NOTE — Telephone Encounter (Signed)
Avs report and appointment schedule given to patient per 03/13/16 los. °

## 2016-03-14 ENCOUNTER — Encounter: Payer: Self-pay | Admitting: Hematology

## 2016-04-11 ENCOUNTER — Other Ambulatory Visit: Payer: Medicare Other

## 2016-04-11 ENCOUNTER — Ambulatory Visit: Payer: Medicare Other | Admitting: Hematology

## 2016-05-17 DIAGNOSIS — J209 Acute bronchitis, unspecified: Secondary | ICD-10-CM | POA: Diagnosis not present

## 2016-07-10 DIAGNOSIS — K5090 Crohn's disease, unspecified, without complications: Secondary | ICD-10-CM | POA: Diagnosis not present

## 2016-07-10 DIAGNOSIS — Z0000 Encounter for general adult medical examination without abnormal findings: Secondary | ICD-10-CM | POA: Diagnosis not present

## 2016-07-10 DIAGNOSIS — I2510 Atherosclerotic heart disease of native coronary artery without angina pectoris: Secondary | ICD-10-CM | POA: Diagnosis not present

## 2016-07-10 DIAGNOSIS — C50919 Malignant neoplasm of unspecified site of unspecified female breast: Secondary | ICD-10-CM | POA: Diagnosis not present

## 2016-07-10 DIAGNOSIS — E11319 Type 2 diabetes mellitus with unspecified diabetic retinopathy without macular edema: Secondary | ICD-10-CM | POA: Diagnosis not present

## 2016-07-10 DIAGNOSIS — E669 Obesity, unspecified: Secondary | ICD-10-CM | POA: Diagnosis not present

## 2016-07-10 DIAGNOSIS — I700 Atherosclerosis of aorta: Secondary | ICD-10-CM | POA: Diagnosis not present

## 2016-07-10 DIAGNOSIS — E7800 Pure hypercholesterolemia, unspecified: Secondary | ICD-10-CM | POA: Diagnosis not present

## 2016-07-10 DIAGNOSIS — F322 Major depressive disorder, single episode, severe without psychotic features: Secondary | ICD-10-CM | POA: Diagnosis not present

## 2016-07-10 DIAGNOSIS — D509 Iron deficiency anemia, unspecified: Secondary | ICD-10-CM | POA: Diagnosis not present

## 2016-07-10 DIAGNOSIS — M810 Age-related osteoporosis without current pathological fracture: Secondary | ICD-10-CM | POA: Diagnosis not present

## 2016-07-10 DIAGNOSIS — I119 Hypertensive heart disease without heart failure: Secondary | ICD-10-CM | POA: Diagnosis not present

## 2016-07-24 ENCOUNTER — Ambulatory Visit
Admission: RE | Admit: 2016-07-24 | Discharge: 2016-07-24 | Disposition: A | Discharge status: Home / Self Care | Payer: Medicare Other | Source: Ambulatory Visit | Attending: Hematology | Admitting: Hematology

## 2016-07-24 DIAGNOSIS — M810 Age-related osteoporosis without current pathological fracture: Secondary | ICD-10-CM | POA: Diagnosis not present

## 2016-07-24 DIAGNOSIS — Z780 Asymptomatic menopausal state: Secondary | ICD-10-CM | POA: Diagnosis not present

## 2016-09-10 NOTE — Progress Notes (Signed)
[facility] OFFICE PROGRESS NOTE  CC Dr. [name] Dr. Claud Kelp Dr. Chipper Herb Dr. Huel Cote  DIAGNOSIS: 71 y.o.  female with invasive lobular carcinoma of the left breast in the upper-outer quadrant stage IIA.  PRIOR THERAPY:  #1 patient originally presented to the multidisciplinary breast clinic in September 2013 with new diagnosis of left invasive lobular carcinoma by ultrasound measuring 1.6 and by MRI measuring 3 cm. She had undergone a needle core biopsy the tumor was lobular ER positive.  #2 she was seen by Dr. Dayton Scrape as well as Dr. Claud Kelp patient was interested in breast conservation. We recommended neoadjuvant antiestrogen therapy to try to reduce the size of the tumor for a good cosmetic result at this time of lumpectomy. She went on to receive letrozole 2.5 mg daily starting in 02/05/2012 through June 2014  #3 status post partial mastectomy with sentinel lymph node biopsy of the left breast. Final pathology did reveal residual disease measuring at T1cN0 ER positive PR negative HER-2/neu negative. Anterior margin was positive and it was reexcised. Oncotype score 29  #4 patient completed radiation therapy to the left breast on 02/18/2013.  #4 began letrozole 2.5 mg started 02/18/2013 for a total of 7 years  CURRENT THERAPY: letrozole 2.5 mg daily  INTERVAL HISTORY: Elizabeth Hebert 71 y.o. female returns for followup visit. She states she is doing well. She reports she has a new grandson. She denies problems associated with Letrozole. She has hot flashes at night in which she sweats so much she has to wake up and change clothes. She denies joint pain. She has good appetite and energy level, functions very well at home.  MEDICAL HISTORY: Past Medical History:  Diagnosis Date  . Anemia   . Anginal pain ([facility])    infreq  . Arthritis   . Broken arm 2011  . Cancer ([facility])    breaast  . Colitis   . Depression   . Diabetes mellitus  without complication ([facility])   . Diverticulitis   . Fibroid tumor   . GERD (gastroesophageal reflux disease)   . H/O hiatal hernia   . Heart murmur   . History of cataract surgery 2012  . Hx of hernia repair   . Hx of radiation therapy 01/26/13- 02/18/13   left breast 4250 cGy 17 sessions  . Hypertension   . Myocardial infarction    stents x 2 when had mi 99    ALLERGIES:  is allergic to vasotec [enalaprilat].  MEDICATIONS:  Current Outpatient Prescriptions  Medication Sig Dispense Refill  . aspirin 81 MG chewable tablet Chew 81 mg by mouth daily.    Marland Kitchen CALCIUM-MAGNESIUM-VITAMIN D PO Take 370 mg by mouth daily.    . cimetidine (TAGAMET) 200 MG tablet Take 200 mg by mouth 2 (two) times daily.    . digoxin (LANOXIN) 0.125 MG tablet Take 0.125 mg by mouth daily.    . Exenatide ER (BYDUREON) 2 MG SUSR Inject 2 mg into the skin once a week.     . ezetimibe (ZETIA) 10 MG tablet Take 10 mg by mouth daily.    . ferrous sulfate 325 (65 FE) MG tablet Take 325 mg by mouth daily with breakfast.    . glimepiride (AMARYL) 4 MG tablet Take 4 mg by mouth daily before breakfast.    . ibandronate (BONIVA) 150 MG tablet Take 150 mg by mouth every 30 (thirty) days.   6  . letrozole (FEMARA) 2.5 MG tablet TAKE  1 TABLET (2.5 MG TOTAL) BY MOUTH DAILY. 90 tablet 3  . metFORMIN (GLUCOPHAGE) 1000 MG tablet Take 1,000 mg by mouth 2 (two) times daily with a meal.    . metoprolol succinate (TOPROL-XL) 50 MG 24 hr tablet Take 50 mg by mouth daily. Take with or immediately following a meal.    . rosuvastatin (CRESTOR) 20 MG tablet Take 20 mg by mouth daily.    . sertraline (ZOLOFT) 50 MG tablet Take 50 mg by mouth daily.    . sitaGLIPtin (JANUVIA) 100 MG tablet Take 100 mg by mouth daily.    . valsartan-hydrochlorothiazide (DIOVAN-HCT) 320-25 MG per tablet Take 1 tablet by mouth daily.  5   No current facility-administered medications for this visit.     SURGICAL HISTORY:  Past Surgical History:  Procedure  Laterality Date  . APPENDECTOMY    . CHOLECYSTECTOMY    . COLON RESECTION    . EYE SURGERY Bilateral    cat   . HERNIA REPAIR     rt  . MASTECTOMY, PARTIAL Left 12/25/2012   Procedure: MASTECTOMY PARTIAL with re-excision of margins;  Surgeon: Ernestene Mention, MD;  Location: [facility];  Service: General;  Laterality: Left;  . PARTIAL MASTECTOMY WITH NEEDLE LOCALIZATION AND AXILLARY SENTINEL LYMPH NODE BX Left 12/08/2012   Procedure: LEFT PARTIAL MASTECTOMY WITH NEEDLE LOCALIZATION AND AXILLARY SENTINEL LYMPH NODE BIOPSIES TIMES FOUR;  Surgeon: Ernestene Mention, MD;  Location: [facility];  Service: General;  Laterality: Left;  . TOTAL ABDOMINAL HYSTERECTOMY  2001    REVIEW OF SYSTEMS:  A 10 point review of systems was conducted and is otherwise negative except for what is noted above.    Health Maintenance Mammogram: 11/2015, normal  Colonoscopy: none  Bone Density Scan: 02/2014, osteoporosis Pap Smear: s/p TAH/BSO 2001 Eye Exam: followed every 6 months due to diabetes Lipid Panel: followed every 6 months by PCP Vitamin D level: never   PHYSICAL EXAMINATION: Blood pressure (!) 155/69, pulse 72, temperature 98.4 F (36.9 C), temperature source Oral, resp. rate 18, height 5\' 1"  (1.549 m), weight 173 lb 11.2 oz (78.8 kg), SpO2 97 %. Body mass index is 32.82 kg/m. GENERAL: Patient is a well appearing female in no acute distress HEENT:  Sclerae anicteric.  Oropharynx clear and moist. No ulcerations or evidence of oropharyngeal candidiasis. Neck is supple.  NODES:  No cervical, supraclavicular, or axillary lymphadenopathy palpated.  LUNGS:  Clear to auscultation bilaterally.  No wheezes or rhonchi. HEART:  Regular rate and rhythm. No murmur appreciated. ABDOMEN:  Soft, nontender.  Positive, normoactive bowel sounds. No organomegaly palpated. MSK:  No focal spinal tenderness to palpation. Full range of motion bilaterally in the upper extremities. EXTREMITIES:  No peripheral  edema.   SKIN:  Clear with no obvious rashes or skin changes. No nail dyscrasia. NEURO:  Nonfocal. Well oriented.  Appropriate affect. BREAST EXAM: right breast no masses, skin changes, nodules, nipple changes, left breast s/p lumpectomy, no nodularity, masses, or sign of recurrence.  Benign breast exam. ECOG PERFORMANCE STATUS: 0 - Asymptomatic  LABORATORY DATA:  CBC Latest Ref Rng & Units 09/11/2016 03/13/2016 08/21/2015  WBC 3.9 - 10.3 10e3/uL 9.4 9.5 9.1  Hemoglobin 11.6 - 15.9 g/dL 08/23/2015 06.6 84.5  Hematocrit 34.8 - 46.6 % 41.3 42.3 41.4  Platelets 145 - 400 10e3/uL 212 233 227    CMP Latest Ref Rng & Units 09/11/2016 03/13/2016 08/21/2015  Glucose 70 - 140 mg/dl 08/23/2015) 151(R) 791(Y)  BUN 7.0 -  26.0 mg/dL 10.3 9.9 8.6  Creatinine 0.6 - 1.1 mg/dL 0.9 1.0 0.9  Sodium 136 - 145 mEq/L 142 142 141  Potassium 3.5 - 5.1 mEq/L 3.5 3.4(L) 3.9  Chloride 96 - 112 mEq/L - - -  CO2 22 - 29 mEq/L 28 27 31(H)  Calcium 8.4 - 10.4 mg/dL 10.2 9.9 9.8  Total Protein 6.4 - 8.3 g/dL 6.7 7.0 7.0  Total Bilirubin 0.20 - 1.20 mg/dL 0.56 0.55 0.56  Alkaline Phos 40 - 150 U/L 45 57 50  AST 5 - 34 U/L 25 25 33  ALT 0 - 55 U/L 30 25 41   DG Bone Density 07/24/2016 ASSESSMENT: The BMD measured at Forearm Radius 33% is 0.636 g/cm2 with a T-score of -2.8. This patient is considered osteoporotic according to [facility]) criteria. Lumbar spine was not utilized due to being previously excluded. There has been a statistically significant increase in BMD of Left hip since prior exam dated 02/14/2014.  Bilateral diagnostic mammogram 11/10/2015 IMPRESSION: No evidence of malignancy in either breast. RECOMMENDATION: Bilateral diagnostic mammogram in 1 year is recommended.  ASSESSMENT: 71 y.o. female with  1. Clinical stage II invasive lobular carcinoma of left breast, s/p neoadjuvant endocrine therapy, pTI CN0, ER +70% PR negative HER-2/neu negative. -She is doing clinically very well.  Exam and last mammogram showed no evidence of recurrence -Lab reviewed, CBC and CMP within normal limits except hyperglycemia -She will continue letrozole once daily, for total 5 years, until September 2019 -Continue yearly screening mammogram. Mammogram in June 2017 was negative. -We discussed healthy diet and exercise, I encouraged her continue to be physically active. -Labs reviewed, no evidence of disease recurrence on clinical exam. Repeat mammogram in 11/2016.  2. Osteoporosis -She was on prolia, switch to Boniva by her primary care physician Dr. Brigitte Pulse  -Repeat bone density scan every 2 years. Bone Density on 07/24/16 was T-score -2.8; osteoporotic. -We discussed letrozole may worse her osteoporosis. I encouraged her to continue calcium and vitamin D and regular exercise.  3. She will continue to follow up with Dr. Brigitte Pulse for her DM, HTN, CAD and other medical issues   I spent 15 minutes counseling the patient face to face. The total time spent in the appointment was 20 minutes.  Plan -Lab and f/u in 6 months -Mammogram in 11/2016  Truitt Merle  09/11/2016    This document serves as a record of services personally performed by Truitt Merle, MD. It was created on her behalf by Darcus Austin, a trained medical scribe. The creation of this record is based on the scribe's personal observations and the provider's statements to them. This document has been checked and approved by the attending provider.

## 2016-09-11 ENCOUNTER — Ambulatory Visit (HOSPITAL_BASED_OUTPATIENT_CLINIC_OR_DEPARTMENT_OTHER): Payer: Medicare Other | Admitting: Hematology

## 2016-09-11 ENCOUNTER — Telehealth: Payer: Self-pay | Admitting: Hematology

## 2016-09-11 ENCOUNTER — Other Ambulatory Visit (HOSPITAL_BASED_OUTPATIENT_CLINIC_OR_DEPARTMENT_OTHER): Payer: Medicare Other

## 2016-09-11 VITALS — BP 155/69 | HR 72 | Temp 98.4°F | Resp 18 | Ht 61.0 in | Wt 173.7 lb

## 2016-09-11 DIAGNOSIS — M810 Age-related osteoporosis without current pathological fracture: Secondary | ICD-10-CM | POA: Diagnosis not present

## 2016-09-11 DIAGNOSIS — C50412 Malignant neoplasm of upper-outer quadrant of left female breast: Secondary | ICD-10-CM

## 2016-09-11 DIAGNOSIS — Z79811 Long term (current) use of aromatase inhibitors: Secondary | ICD-10-CM

## 2016-09-11 DIAGNOSIS — Z170 Estrogen receptor positive status [ER+]: Secondary | ICD-10-CM

## 2016-09-11 DIAGNOSIS — R739 Hyperglycemia, unspecified: Secondary | ICD-10-CM | POA: Diagnosis not present

## 2016-09-11 LAB — CBC WITH DIFFERENTIAL/PLATELET
BASO%: 0.8 % (ref 0.0–2.0)
BASOS ABS: 0.1 10*3/uL (ref 0.0–0.1)
EOS ABS: 0.9 10*3/uL — AB (ref 0.0–0.5)
EOS%: 9.2 % — AB (ref 0.0–7.0)
HEMATOCRIT: 41.3 % (ref 34.8–46.6)
HEMOGLOBIN: 13.7 g/dL (ref 11.6–15.9)
LYMPH%: 17.9 % (ref 14.0–49.7)
MCH: 27.7 pg (ref 25.1–34.0)
MCHC: 33.1 g/dL (ref 31.5–36.0)
MCV: 83.7 fL (ref 79.5–101.0)
MONO#: 0.8 10*3/uL (ref 0.1–0.9)
MONO%: 8.2 % (ref 0.0–14.0)
NEUT%: 63.9 % (ref 38.4–76.8)
NEUTROS ABS: 6 10*3/uL (ref 1.5–6.5)
Platelets: 212 10*3/uL (ref 145–400)
RBC: 4.93 10*6/uL (ref 3.70–5.45)
RDW: 15.3 % — AB (ref 11.2–14.5)
WBC: 9.4 10*3/uL (ref 3.9–10.3)
lymph#: 1.7 10*3/uL (ref 0.9–3.3)

## 2016-09-11 LAB — COMPREHENSIVE METABOLIC PANEL
ALBUMIN: 3.7 g/dL (ref 3.5–5.0)
ALT: 30 U/L (ref 0–55)
AST: 25 U/L (ref 5–34)
Alkaline Phosphatase: 45 U/L (ref 40–150)
Anion Gap: 12 mEq/L — ABNORMAL HIGH (ref 3–11)
BUN: 10.3 mg/dL (ref 7.0–26.0)
CALCIUM: 10.2 mg/dL (ref 8.4–10.4)
CHLORIDE: 101 meq/L (ref 98–109)
CO2: 28 meq/L (ref 22–29)
Creatinine: 0.9 mg/dL (ref 0.6–1.1)
EGFR: 63 mL/min/{1.73_m2} — ABNORMAL LOW (ref >90)
GLUCOSE: 155 mg/dL — AB (ref 70–140)
Potassium: 3.5 mEq/L (ref 3.5–5.1)
SODIUM: 142 meq/L (ref 136–145)
Total Bilirubin: 0.56 mg/dL (ref 0.20–1.20)
Total Protein: 6.7 g/dL (ref 6.4–8.3)

## 2016-09-11 NOTE — Telephone Encounter (Signed)
Gave patient avs report and appointments for October and mammo for 6/12 at the [facility].

## 2016-09-12 ENCOUNTER — Encounter: Payer: Self-pay | Admitting: Hematology

## 2016-11-07 DIAGNOSIS — I2510 Atherosclerotic heart disease of native coronary artery without angina pectoris: Secondary | ICD-10-CM | POA: Diagnosis not present

## 2016-11-07 DIAGNOSIS — I700 Atherosclerosis of aorta: Secondary | ICD-10-CM | POA: Diagnosis not present

## 2016-11-07 DIAGNOSIS — E669 Obesity, unspecified: Secondary | ICD-10-CM | POA: Diagnosis not present

## 2016-11-07 DIAGNOSIS — E113293 Type 2 diabetes mellitus with mild nonproliferative diabetic retinopathy without macular edema, bilateral: Secondary | ICD-10-CM | POA: Diagnosis not present

## 2016-11-07 DIAGNOSIS — F322 Major depressive disorder, single episode, severe without psychotic features: Secondary | ICD-10-CM | POA: Diagnosis not present

## 2016-11-07 DIAGNOSIS — E7800 Pure hypercholesterolemia, unspecified: Secondary | ICD-10-CM | POA: Diagnosis not present

## 2016-11-07 DIAGNOSIS — E11319 Type 2 diabetes mellitus with unspecified diabetic retinopathy without macular edema: Secondary | ICD-10-CM | POA: Diagnosis not present

## 2016-11-07 DIAGNOSIS — I119 Hypertensive heart disease without heart failure: Secondary | ICD-10-CM | POA: Diagnosis not present

## 2016-11-07 DIAGNOSIS — Z7984 Long term (current) use of oral hypoglycemic drugs: Secondary | ICD-10-CM | POA: Diagnosis not present

## 2016-11-12 ENCOUNTER — Ambulatory Visit
Admission: RE | Admit: 2016-11-12 | Discharge: 2016-11-12 | Disposition: A | Discharge status: Home / Self Care | Payer: Medicare Other | Source: Ambulatory Visit | Attending: Hematology | Admitting: Hematology

## 2016-11-12 DIAGNOSIS — Z170 Estrogen receptor positive status [ER+]: Principal | ICD-10-CM

## 2016-11-12 DIAGNOSIS — R928 Other abnormal and inconclusive findings on diagnostic imaging of breast: Secondary | ICD-10-CM | POA: Diagnosis not present

## 2016-11-12 DIAGNOSIS — C50412 Malignant neoplasm of upper-outer quadrant of left female breast: Secondary | ICD-10-CM

## 2016-11-12 HISTORY — DX: Malignant neoplasm of unspecified site of unspecified female breast: C50.919

## 2016-11-12 HISTORY — DX: Personal history of irradiation: Z92.3

## 2017-01-29 DIAGNOSIS — H26492 Other secondary cataract, left eye: Secondary | ICD-10-CM | POA: Diagnosis not present

## 2017-01-29 DIAGNOSIS — E113293 Type 2 diabetes mellitus with mild nonproliferative diabetic retinopathy without macular edema, bilateral: Secondary | ICD-10-CM | POA: Diagnosis not present

## 2017-01-29 DIAGNOSIS — H52203 Unspecified astigmatism, bilateral: Secondary | ICD-10-CM | POA: Diagnosis not present

## 2017-03-11 ENCOUNTER — Telehealth: Payer: Self-pay | Admitting: Hematology

## 2017-03-11 NOTE — Telephone Encounter (Signed)
R/s 10/10 appt to next week per YF request, patient is aware of appt date and time.

## 2017-03-12 ENCOUNTER — Other Ambulatory Visit: Payer: Medicare Other

## 2017-03-12 ENCOUNTER — Ambulatory Visit: Payer: Medicare Other | Admitting: Hematology

## 2017-03-18 ENCOUNTER — Ambulatory Visit (HOSPITAL_BASED_OUTPATIENT_CLINIC_OR_DEPARTMENT_OTHER): Payer: Medicare Other | Admitting: Nurse Practitioner

## 2017-03-18 ENCOUNTER — Telehealth: Payer: Self-pay | Admitting: Nurse Practitioner

## 2017-03-18 ENCOUNTER — Other Ambulatory Visit (HOSPITAL_BASED_OUTPATIENT_CLINIC_OR_DEPARTMENT_OTHER): Payer: Medicare Other

## 2017-03-18 VITALS — BP 145/63 | HR 78 | Temp 98.1°F | Resp 18 | Ht 61.0 in | Wt 172.9 lb

## 2017-03-18 DIAGNOSIS — Z23 Encounter for immunization: Secondary | ICD-10-CM | POA: Diagnosis present

## 2017-03-18 DIAGNOSIS — N951 Menopausal and female climacteric states: Secondary | ICD-10-CM

## 2017-03-18 DIAGNOSIS — Z79811 Long term (current) use of aromatase inhibitors: Secondary | ICD-10-CM | POA: Diagnosis not present

## 2017-03-18 DIAGNOSIS — M2550 Pain in unspecified joint: Secondary | ICD-10-CM | POA: Diagnosis not present

## 2017-03-18 DIAGNOSIS — I10 Essential (primary) hypertension: Secondary | ICD-10-CM

## 2017-03-18 DIAGNOSIS — C50412 Malignant neoplasm of upper-outer quadrant of left female breast: Secondary | ICD-10-CM

## 2017-03-18 DIAGNOSIS — I2510 Atherosclerotic heart disease of native coronary artery without angina pectoris: Secondary | ICD-10-CM | POA: Diagnosis not present

## 2017-03-18 DIAGNOSIS — M810 Age-related osteoporosis without current pathological fracture: Secondary | ICD-10-CM

## 2017-03-18 DIAGNOSIS — E119 Type 2 diabetes mellitus without complications: Secondary | ICD-10-CM

## 2017-03-18 DIAGNOSIS — C50411 Malignant neoplasm of upper-outer quadrant of right female breast: Secondary | ICD-10-CM

## 2017-03-18 DIAGNOSIS — Z170 Estrogen receptor positive status [ER+]: Secondary | ICD-10-CM | POA: Diagnosis not present

## 2017-03-18 LAB — CBC WITH DIFFERENTIAL/PLATELET
BASO%: 0.6 % (ref 0.0–2.0)
Basophils Absolute: 0.1 10*3/uL (ref 0.0–0.1)
EOS%: 6.9 % (ref 0.0–7.0)
Eosinophils Absolute: 0.7 10*3/uL — ABNORMAL HIGH (ref 0.0–0.5)
HEMATOCRIT: 44 % (ref 34.8–46.6)
HEMOGLOBIN: 14.5 g/dL (ref 11.6–15.9)
LYMPH#: 1.6 10*3/uL (ref 0.9–3.3)
LYMPH%: 15 % (ref 14.0–49.7)
MCH: 28.5 pg (ref 25.1–34.0)
MCHC: 33 g/dL (ref 31.5–36.0)
MCV: 86.4 fL (ref 79.5–101.0)
MONO#: 0.8 10*3/uL (ref 0.1–0.9)
MONO%: 7.4 % (ref 0.0–14.0)
NEUT%: 70.1 % (ref 38.4–76.8)
NEUTROS ABS: 7.4 10*3/uL — AB (ref 1.5–6.5)
Platelets: 224 10*3/uL (ref 145–400)
RBC: 5.09 10*6/uL (ref 3.70–5.45)
RDW: 15.3 % — AB (ref 11.2–14.5)
WBC: 10.5 10*3/uL — AB (ref 3.9–10.3)

## 2017-03-18 LAB — COMPREHENSIVE METABOLIC PANEL
ALBUMIN: 3.7 g/dL (ref 3.5–5.0)
ALK PHOS: 51 U/L (ref 40–150)
ALT: 27 U/L (ref 0–55)
AST: 22 U/L (ref 5–34)
Anion Gap: 13 mEq/L — ABNORMAL HIGH (ref 3–11)
BUN: 8.8 mg/dL (ref 7.0–26.0)
CALCIUM: 10.2 mg/dL (ref 8.4–10.4)
CO2: 27 mEq/L (ref 22–29)
CREATININE: 1 mg/dL (ref 0.6–1.1)
Chloride: 100 mEq/L (ref 98–109)
EGFR: 56 mL/min/{1.73_m2} — ABNORMAL LOW (ref >60)
GLUCOSE: 201 mg/dL — AB (ref 70–140)
POTASSIUM: 3.8 meq/L (ref 3.5–5.1)
SODIUM: 140 meq/L (ref 136–145)
TOTAL PROTEIN: 6.8 g/dL (ref 6.4–8.3)
Total Bilirubin: 0.59 mg/dL (ref 0.20–1.20)

## 2017-03-18 MED ORDER — INFLUENZA VAC SPLIT HIGH-DOSE 0.5 ML IM SUSY
0.5000 mL | PREFILLED_SYRINGE | INTRAMUSCULAR | Status: AC
  Administered 2017-03-18: 0.5 mL via INTRAMUSCULAR
  Filled 2017-03-18: qty 0.5

## 2017-03-18 MED ORDER — LETROZOLE 2.5 MG PO TABS: ORAL_TABLET | ORAL | 3 refills | Status: DC

## 2017-03-18 NOTE — Progress Notes (Signed)
  [facility] OFFICE PROGRESS NOTE   Diagnosis:  Breast cancer  INTERVAL HISTORY:   Ms. Schey returns as scheduled. She continues Letrozole 2.5 mg daily. She has hot flashes intermittently, worse at nighttime. She continues to have arthralgias. She reports a good appetite. She overall feels well. She remains very active. She is providing care for her 62-monthold grandchild. She recently started a new diabetes medication and notes improved control of blood sugars.  Objective:  Vital signs in last 24 hours:  Blood pressure (!) 145/63, pulse 78, temperature 98.1 F (36.7 C), temperature source Oral, resp. rate 18, height 5' 1" (1.549 m), weight 172 lb 14.4 oz (78.4 kg), SpO2 95 %.    HEENT: neck without mass. Lymphatics: no palpable cervical, supraclavicular or axillary lymph nodes. Resp: lungs clear bilaterally. Cardio: regular rate and rhythm. GI: abdomen soft and nontender. No hepatomegaly. Vascular: no leg edema. Breasts: status post left lumpectomy. No mass palpated in either breast.  Lab Results:  Lab Results  Component Value Date   WBC 10.5 (H) 03/18/2017   HGB 14.5 03/18/2017   HCT 44.0 03/18/2017   MCV 86.4 03/18/2017   PLT 224 03/18/2017   NEUTROABS 7.4 (H) 03/18/2017    Imaging:  No results found.  Medications: I have reviewed the patient's current medications.  Assessment/Plan: 1. Clinical stage II invasive lobular carcinoma of left breast, s/p neoadjuvant endocrine therapy, pTI CN0, ER +70% PR negative HER-2/neu negative. Letrozole 2.5 mg daily initiated 02/18/2013. Negative mammogram 11/12/2016. 2. Osteoporosis. Bone density scan every 2 years, last done 07/24/2016. She is taking Boniva. She continues calcium and vitamin D. 3. Diabetes mellitus, hypertension, CAD   Disposition:Ms. Hochberg appears stable. She remains in clinical remission from breast cancer. She will continue Letrozole. She is up-to-date on mammogram and bone density scans.  She will return for a follow-up visit in 6 months.  The influenza vaccine was administered at today's visit.  TNed CardANP/GNP-BC   03/18/2017  10:48 AM

## 2017-03-18 NOTE — Telephone Encounter (Signed)
Scheduled appt per 10/16 los - Gave patient AVS and calender per los.  

## 2017-03-20 DIAGNOSIS — J209 Acute bronchitis, unspecified: Secondary | ICD-10-CM | POA: Diagnosis not present

## 2017-03-20 DIAGNOSIS — J0100 Acute maxillary sinusitis, unspecified: Secondary | ICD-10-CM | POA: Diagnosis not present

## 2017-05-03 DIAGNOSIS — J0100 Acute maxillary sinusitis, unspecified: Secondary | ICD-10-CM | POA: Diagnosis not present

## 2017-05-03 DIAGNOSIS — R0600 Dyspnea, unspecified: Secondary | ICD-10-CM | POA: Diagnosis not present

## 2017-05-03 DIAGNOSIS — R05 Cough: Secondary | ICD-10-CM | POA: Diagnosis not present

## 2017-05-03 DIAGNOSIS — R0602 Shortness of breath: Secondary | ICD-10-CM | POA: Diagnosis not present

## 2017-06-18 DIAGNOSIS — J069 Acute upper respiratory infection, unspecified: Secondary | ICD-10-CM | POA: Diagnosis not present

## 2017-07-31 DIAGNOSIS — C50919 Malignant neoplasm of unspecified site of unspecified female breast: Secondary | ICD-10-CM | POA: Diagnosis not present

## 2017-07-31 DIAGNOSIS — M810 Age-related osteoporosis without current pathological fracture: Secondary | ICD-10-CM | POA: Diagnosis not present

## 2017-07-31 DIAGNOSIS — E669 Obesity, unspecified: Secondary | ICD-10-CM | POA: Diagnosis not present

## 2017-07-31 DIAGNOSIS — Z0000 Encounter for general adult medical examination without abnormal findings: Secondary | ICD-10-CM | POA: Diagnosis not present

## 2017-07-31 DIAGNOSIS — I700 Atherosclerosis of aorta: Secondary | ICD-10-CM | POA: Diagnosis not present

## 2017-07-31 DIAGNOSIS — K5090 Crohn's disease, unspecified, without complications: Secondary | ICD-10-CM | POA: Diagnosis not present

## 2017-07-31 DIAGNOSIS — E7800 Pure hypercholesterolemia, unspecified: Secondary | ICD-10-CM | POA: Diagnosis not present

## 2017-07-31 DIAGNOSIS — I119 Hypertensive heart disease without heart failure: Secondary | ICD-10-CM | POA: Diagnosis not present

## 2017-07-31 DIAGNOSIS — F322 Major depressive disorder, single episode, severe without psychotic features: Secondary | ICD-10-CM | POA: Diagnosis not present

## 2017-07-31 DIAGNOSIS — K219 Gastro-esophageal reflux disease without esophagitis: Secondary | ICD-10-CM | POA: Diagnosis not present

## 2017-07-31 DIAGNOSIS — I2510 Atherosclerotic heart disease of native coronary artery without angina pectoris: Secondary | ICD-10-CM | POA: Diagnosis not present

## 2017-07-31 DIAGNOSIS — E11319 Type 2 diabetes mellitus with unspecified diabetic retinopathy without macular edema: Secondary | ICD-10-CM | POA: Diagnosis not present

## 2017-08-01 DIAGNOSIS — S0502XA Injury of conjunctiva and corneal abrasion without foreign body, left eye, initial encounter: Secondary | ICD-10-CM | POA: Diagnosis not present

## 2017-08-06 DIAGNOSIS — S0502XD Injury of conjunctiva and corneal abrasion without foreign body, left eye, subsequent encounter: Secondary | ICD-10-CM | POA: Diagnosis not present

## 2017-09-15 NOTE — Progress Notes (Signed)
[facility] OFFICE PROGRESS NOTE  CC Dr. Mayra Neer Dr. Fanny Skates Dr. Arloa Koh Dr. Paula Compton  DIAGNOSIS: 72 y.o.  female with invasive lobular carcinoma of the left breast in the upper-outer quadrant stage IIA.  PRIOR THERAPY:  #1 patient originally presented to the multidisciplinary breast clinic in September 2013 with new diagnosis of left invasive lobular carcinoma by ultrasound measuring 1.6 and by MRI measuring 3 cm. She had undergone a needle core biopsy the tumor was lobular ER positive.  #2 she was seen by Dr. Valere Dross as well as Dr. Fanny Skates patient was interested in breast conservation. We recommended neoadjuvant antiestrogen therapy to try to reduce the size of the tumor for a good cosmetic result at this time of lumpectomy. She went on to receive letrozole 2.5 mg daily starting in 02/05/2012 through June 2014  #3 status post partial mastectomy with sentinel lymph node biopsy of the left breast. Final pathology did reveal residual disease measuring at T1cN0 ER positive PR negative HER-2/neu negative. Anterior margin was positive and it was reexcised. Oncotype score 29  #4 patient completed radiation therapy to the left breast on 02/18/2013.  #4 began letrozole 2.5 mg started 02/18/2013 for a total of 7 years  CURRENT THERAPY: Letrozole 2.5 mg daily  INTERVAL HISTORY: Elizabeth Hebert 72 y.o. female returns for follow up visit. She presents to the clinic today by herself. She reports she is doing well overall. She reports no new medical issues or hospitalizations. She is compliant with Letrozole and reports joint pain in her hip and neck but it is overall manageable. She takes Tylenol as needed. She does have hot flashes that wake her up on average once a night. She is tolerating it.   On review of systems, pt denies any other complaints at this time. Pertinent positives are listed and detailed within the above HPI.   MEDICAL  HISTORY: Past Medical History:  Diagnosis Date  . Anemia   . Anginal pain ([place])    infreq  . Arthritis   . Breast cancer ([place]) 12/08/2012   left lumpectomy  . Broken arm 2011  . Cancer ([place])    breaast  . Colitis   . Depression   . Diabetes mellitus without complication ([place])   . Diverticulitis   . Fibroid tumor   . GERD (gastroesophageal reflux disease)   . H/O hiatal hernia   . Heart murmur   . History of cataract surgery 2012  . Hx of hernia repair   . Hx of radiation therapy 01/26/13- 02/18/13   left breast 4250 cGy 17 sessions  . Hypertension   . Myocardial infarction ([place])    stents x 2 when had mi 99  . Personal history of radiation therapy 12/08/2012    ALLERGIES:  is allergic to vasotec [enalaprilat].  MEDICATIONS:  Current Outpatient Medications  Medication Sig Dispense Refill  . aspirin 81 MG chewable tablet Chew 81 mg by mouth daily.    Marland Kitchen CALCIUM-MAGNESIUM-VITAMIN D PO Take 370 mg by mouth daily.    . cimetidine (TAGAMET) 200 MG tablet Take 200 mg by mouth 2 (two) times daily.    . digoxin (LANOXIN) 0.125 MG tablet Take 0.125 mg by mouth daily.    . Empagliflozin-Linagliptin (GLYXAMBI) 25-5 MG TABS Take by mouth daily.    . Exenatide ER (BYDUREON) 2 MG SUSR Inject 2 mg into the skin once a week.     . ezetimibe (ZETIA) 10 MG tablet Take 10 mg  by mouth daily.    . ferrous sulfate 325 (65 FE) MG tablet Take 325 mg by mouth daily with breakfast.    . glimepiride (AMARYL) 4 MG tablet Take 4 mg by mouth daily before breakfast.    . ibandronate (BONIVA) 150 MG tablet Take 150 mg by mouth every 30 (thirty) days.   6  . letrozole (FEMARA) 2.5 MG tablet TAKE 1 TABLET (2.5 MG TOTAL) BY MOUTH DAILY. 90 tablet 3  . metFORMIN (GLUCOPHAGE) 1000 MG tablet Take 1,000 mg by mouth 2 (two) times daily with a meal.    . metoprolol succinate (TOPROL-XL) 50 MG 24 hr tablet Take 50 mg by mouth daily. Take with or immediately following a meal.    . rosuvastatin (CRESTOR) 20 MG  tablet Take 20 mg by mouth daily.    . sertraline (ZOLOFT) 50 MG tablet Take 50 mg by mouth daily.    . valsartan-hydrochlorothiazide (DIOVAN-HCT) 320-25 MG per tablet Take 1 tablet by mouth daily.  5   No current facility-administered medications for this visit.     SURGICAL HISTORY:  Past Surgical History:  Procedure Laterality Date  . APPENDECTOMY    . BREAST LUMPECTOMY Left 12/08/2012   left lumpectomy  . CHOLECYSTECTOMY    . COLON RESECTION    . EYE SURGERY Bilateral    cat   . HERNIA REPAIR     rt  . MASTECTOMY, PARTIAL Left 12/25/2012   Procedure: MASTECTOMY PARTIAL with re-excision of margins;  Surgeon: Adin Hector, MD;  Location: [facility];  Service: General;  Laterality: Left;  . PARTIAL MASTECTOMY WITH NEEDLE LOCALIZATION AND AXILLARY SENTINEL LYMPH NODE BX Left 12/08/2012   Procedure: LEFT PARTIAL MASTECTOMY WITH NEEDLE LOCALIZATION AND AXILLARY SENTINEL LYMPH NODE BIOPSIES TIMES FOUR;  Surgeon: Adin Hector, MD;  Location: [facility];  Service: General;  Laterality: Left;  . TOTAL ABDOMINAL HYSTERECTOMY  2001    REVIEW OF SYSTEMS:  A 10 point review of systems was conducted and is otherwise negative except for what is noted above.    Health Maintenance Mammogram: 11/2015, normal  Colonoscopy: none  Bone Density Scan: 02/2014, osteoporosis Pap Smear: s/p TAH/BSO 2001 Eye Exam: followed every 6 months due to diabetes Lipid Panel: followed every 6 months by PCP Vitamin D level: never   PHYSICAL EXAMINATION: Blood pressure (!) 119/47, pulse 76, temperature 98.6 F (37 C), temperature source Oral, resp. rate 18, height '5\' 1"'$  (1.549 m), weight 170 lb 4.8 oz (77.2 kg), SpO2 95 %. Body mass index is 32.18 kg/m. GENERAL: Patient is a well appearing female in no acute distress HEENT:  Sclerae anicteric.  Oropharynx clear and moist. No ulcerations or evidence of oropharyngeal candidiasis. Neck is supple.  NODES:  No cervical, supraclavicular, or axillary  lymphadenopathy palpated.  LUNGS:  Clear to auscultation bilaterally.  No wheezes or rhonchi. HEART:  Regular rate and rhythm. No murmur appreciated. ABDOMEN:  Soft, nontender.  Positive, normoactive bowel sounds. No organomegaly palpated. MSK:  No focal spinal tenderness to palpation. Full range of motion bilaterally in the upper extremities. EXTREMITIES:  No peripheral edema.   SKIN:  Clear with no obvious rashes or skin changes. No nail dyscrasia. NEURO:  Nonfocal. Well oriented.  Appropriate affect. BREAST EXAM: right breast no masses, skin changes, nodules, nipple changes, left breast s/p lumpectomy, no nodularity, masses, or sign of recurrence.  Benign breast exam. ECOG PERFORMANCE STATUS: 0 - Asymptomatic  LABORATORY DATA:  CBC Latest Ref Rng & Units 09/17/2017  03/18/2017 09/11/2016  WBC 3.9 - 10.3 K/uL 8.8 10.5(H) 9.4  Hemoglobin 11.6 - 15.9 g/dL 14.3 14.5 13.7  Hematocrit 34.8 - 46.6 % 43.8 44.0 41.3  Platelets 145 - 400 K/uL 229 224 212    CMP Latest Ref Rng & Units 09/17/2017 03/18/2017 09/11/2016  Glucose 70 - 140 mg/dL 235(H) 201(H) 155(H)  BUN 7 - 26 mg/dL 13 8.8 10.3  Creatinine 0.60 - 1.10 mg/dL 1.17(H) 1.0 0.9  Sodium 136 - 145 mmol/L 141 140 142  Potassium 3.5 - 5.1 mmol/L 3.9 3.8 3.5  Chloride 98 - 109 mmol/L 102 - -  CO2 22 - 29 mmol/L '29 27 28  '$ Calcium 8.4 - 10.4 mg/dL 10.4 10.2 10.2  Total Protein 6.4 - 8.3 g/dL 7.0 6.8 6.7  Total Bilirubin 0.2 - 1.2 mg/dL 0.4 0.59 0.56  Alkaline Phos 40 - 150 U/L 63 51 45  AST 5 - 34 U/L '23 22 25  '$ ALT 0 - 55 U/L '29 27 30   '$ Diagnostic Mammogram 11/12/16 IMPRESSION: No evidence of malignancy within either breast. Stable postsurgical changes within the left breast.  DG Bone Density 07/24/2016 ASSESSMENT: The BMD measured at Forearm Radius 33% is 0.636 g/cm2 with a T-score of -2.8. This patient is considered osteoporotic according to [facility]) criteria. Lumbar spine was not utilized due to being  previously excluded. There has been a statistically significant increase in BMD of Left hip since prior exam dated 02/14/2014.  Bilateral diagnostic mammogram 11/10/2015 IMPRESSION: No evidence of malignancy in either breast. RECOMMENDATION: Bilateral diagnostic mammogram in 1 year is recommended.  ASSESSMENT: 72 y.o. female with  1. Clinical stage II invasive lobular carcinoma of left breast, s/p neoadjuvant endocrine therapy, pTI CN0, ER +70% PR negative HER-2/neu negative. -the patient originally presented to the multidisciplinary breast clinic in September 2013 with new diagnosis of left invasive lobular carcinoma by ultrasound measuring 1.6 and by MRI measuring 3 cm. -Neoadjuvant antiestrogen therapy was recommended to try to reduce the size of the tumor for a good cosmetic result at that time. She went on to receive letrozole 2.5 mg daily starting in 02/05/2012 through June 2014 -She underwent a partial mastectomy with sentinel lymph node biopsy of the left breast. Final pathology did reveal residual disease measuring at T1cN0 ER positive PR negative HER-2/neu negative. Anterior margin was positive and it was reexcised. Oncotype score 29 -She completed radiation therapy to the left breast from Sept 2014 -October 2014 -She will continue letrozole once daily, for total 7 years, until September 2021 -We'll continue breast cancer surveillance. I encouraged her to do self exam.  -I encouraged her to continue healthy diet, remain to be physically active, and continue exercise. -She is clinically doing well. Labs reviewed, her labs are WNL with the exception of elevated Cr at 1.17 and decreased GFRs. I advised her to drink plenty of water and to avoid Nsaids if she can. Her physical exam was unremarkable. Her last mammogram from 11/12/16 revealed no evidence of malignancy. There is no clinical concern for recurrence.  -Next mammogram due in June 2019, ordered today -F/u in 6 months and then once a  year, will alternate with her annual check up with her PCP    2. Osteoporosis -She was on prolia, switch to Boniva by her primary care physician Dr. Brigitte Pulse  -Repeat bone density scan every 2 years. Bone Density on 07/24/16 was T-score -2.8; osteoporotic. -We previously discussed letrozole may worse her osteoporosis. I encouraged her to continue calcium  and vitamin D and regular exercise.  3.  DM, HTN, CAD and other medical issues  -She will continue to follow up with PCP Dr. Brigitte Pulse -Her glucose is 235 today, which could be contributing to her decreased kidney function. I recommend that she follow up with PCP more regularly instead of yearly.   Plan -Lab and f/u in 6 months with and then in 1 year -I encouraged her follow up with her PCP more than annually, to monitor her DM and HTN  -Continue Letrozole -Mammogram in 11/2017, ordered today  All questions were answered. The patient knows to call the clinic with any problems, questions or concerns. I spent 15 minutes counseling the patient face to face. The total time spent in the appointment was 20 minutes and more than 50% was on counseling.  This document serves as a record of services personally performed by Truitt Merle, MD. It was created on her behalf by Theresia Bough, a trained medical scribe. The creation of this record is based on the scribe's personal observations and the provider's statements to them.   I have reviewed the above documentation for accuracy and completeness, and I agree with the above.   Truitt Merle  09/17/2017

## 2017-09-17 ENCOUNTER — Encounter: Payer: Self-pay | Admitting: Hematology

## 2017-09-17 ENCOUNTER — Inpatient Hospital Stay: Payer: Medicare Other

## 2017-09-17 ENCOUNTER — Inpatient Hospital Stay: Payer: Medicare Other | Attending: Hematology | Admitting: Hematology

## 2017-09-17 ENCOUNTER — Telehealth: Payer: Self-pay | Admitting: Hematology

## 2017-09-17 VITALS — BP 119/47 | HR 76 | Temp 98.6°F | Resp 18 | Ht 61.0 in | Wt 170.3 lb

## 2017-09-17 DIAGNOSIS — I10 Essential (primary) hypertension: Secondary | ICD-10-CM | POA: Insufficient documentation

## 2017-09-17 DIAGNOSIS — I2510 Atherosclerotic heart disease of native coronary artery without angina pectoris: Secondary | ICD-10-CM | POA: Diagnosis not present

## 2017-09-17 DIAGNOSIS — C50412 Malignant neoplasm of upper-outer quadrant of left female breast: Secondary | ICD-10-CM | POA: Diagnosis not present

## 2017-09-17 DIAGNOSIS — I252 Old myocardial infarction: Secondary | ICD-10-CM | POA: Diagnosis not present

## 2017-09-17 DIAGNOSIS — M810 Age-related osteoporosis without current pathological fracture: Secondary | ICD-10-CM | POA: Insufficient documentation

## 2017-09-17 DIAGNOSIS — Z1231 Encounter for screening mammogram for malignant neoplasm of breast: Secondary | ICD-10-CM

## 2017-09-17 DIAGNOSIS — C50411 Malignant neoplasm of upper-outer quadrant of right female breast: Secondary | ICD-10-CM

## 2017-09-17 DIAGNOSIS — Z79811 Long term (current) use of aromatase inhibitors: Secondary | ICD-10-CM | POA: Diagnosis not present

## 2017-09-17 DIAGNOSIS — Z170 Estrogen receptor positive status [ER+]: Secondary | ICD-10-CM | POA: Insufficient documentation

## 2017-09-17 DIAGNOSIS — E119 Type 2 diabetes mellitus without complications: Secondary | ICD-10-CM | POA: Insufficient documentation

## 2017-09-17 LAB — COMPREHENSIVE METABOLIC PANEL
ALT: 29 U/L (ref 0–55)
ANION GAP: 10 (ref 3–11)
AST: 23 U/L (ref 5–34)
Albumin: 3.8 g/dL (ref 3.5–5.0)
Alkaline Phosphatase: 63 U/L (ref 40–150)
BUN: 13 mg/dL (ref 7–26)
CO2: 29 mmol/L (ref 22–29)
Calcium: 10.4 mg/dL (ref 8.4–10.4)
Chloride: 102 mmol/L (ref 98–109)
Creatinine, Ser: 1.17 mg/dL — ABNORMAL HIGH (ref 0.60–1.10)
GFR, EST AFRICAN AMERICAN: 52 mL/min — AB (ref >60)
GFR, EST NON AFRICAN AMERICAN: 45 mL/min — AB (ref >60)
Glucose, Bld: 235 mg/dL — ABNORMAL HIGH (ref 70–140)
POTASSIUM: 3.9 mmol/L (ref 3.5–5.1)
Sodium: 141 mmol/L (ref 136–145)
TOTAL PROTEIN: 7 g/dL (ref 6.4–8.3)
Total Bilirubin: 0.4 mg/dL (ref 0.2–1.2)

## 2017-09-17 LAB — CBC WITH DIFFERENTIAL/PLATELET
BASOS ABS: 0.1 10*3/uL (ref 0.0–0.1)
Basophils Relative: 1 %
EOS PCT: 7 %
Eosinophils Absolute: 0.6 10*3/uL — ABNORMAL HIGH (ref 0.0–0.5)
HCT: 43.8 % (ref 34.8–46.6)
HEMOGLOBIN: 14.3 g/dL (ref 11.6–15.9)
LYMPHS ABS: 1.6 10*3/uL (ref 0.9–3.3)
LYMPHS PCT: 18 %
MCH: 28.6 pg (ref 25.1–34.0)
MCHC: 32.7 g/dL (ref 31.5–36.0)
MCV: 87.3 fL (ref 79.5–101.0)
Monocytes Absolute: 0.6 10*3/uL (ref 0.1–0.9)
Monocytes Relative: 7 %
NEUTROS ABS: 5.9 10*3/uL (ref 1.5–6.5)
NEUTROS PCT: 67 %
PLATELETS: 229 10*3/uL (ref 145–400)
RBC: 5.02 MIL/uL (ref 3.70–5.45)
RDW: 15.9 % — ABNORMAL HIGH (ref 11.2–14.5)
WBC: 8.8 10*3/uL (ref 3.9–10.3)

## 2017-09-17 NOTE — Telephone Encounter (Signed)
Appointments scheduled AVS/Calendar printed per 4/17 los °

## 2017-12-01 DIAGNOSIS — I119 Hypertensive heart disease without heart failure: Secondary | ICD-10-CM | POA: Diagnosis not present

## 2017-12-01 DIAGNOSIS — I2510 Atherosclerotic heart disease of native coronary artery without angina pectoris: Secondary | ICD-10-CM | POA: Diagnosis not present

## 2017-12-01 DIAGNOSIS — E11319 Type 2 diabetes mellitus with unspecified diabetic retinopathy without macular edema: Secondary | ICD-10-CM | POA: Diagnosis not present

## 2017-12-01 DIAGNOSIS — Z6834 Body mass index (BMI) 34.0-34.9, adult: Secondary | ICD-10-CM | POA: Diagnosis not present

## 2017-12-01 DIAGNOSIS — E7800 Pure hypercholesterolemia, unspecified: Secondary | ICD-10-CM | POA: Diagnosis not present

## 2017-12-01 DIAGNOSIS — F322 Major depressive disorder, single episode, severe without psychotic features: Secondary | ICD-10-CM | POA: Diagnosis not present

## 2017-12-01 DIAGNOSIS — E669 Obesity, unspecified: Secondary | ICD-10-CM | POA: Diagnosis not present

## 2017-12-01 DIAGNOSIS — Z7984 Long term (current) use of oral hypoglycemic drugs: Secondary | ICD-10-CM | POA: Diagnosis not present

## 2018-01-16 ENCOUNTER — Other Ambulatory Visit: Payer: Self-pay | Admitting: Hematology

## 2018-01-16 DIAGNOSIS — Z9889 Other specified postprocedural states: Secondary | ICD-10-CM

## 2018-01-26 ENCOUNTER — Other Ambulatory Visit: Payer: Self-pay | Admitting: Adult Health

## 2018-01-26 DIAGNOSIS — Z9889 Other specified postprocedural states: Secondary | ICD-10-CM

## 2018-01-27 ENCOUNTER — Ambulatory Visit
Admission: RE | Admit: 2018-01-27 | Discharge: 2018-01-27 | Disposition: A | Discharge status: Home / Self Care | Payer: Medicare Other | Source: Ambulatory Visit | Attending: Hematology | Admitting: Hematology

## 2018-01-27 DIAGNOSIS — R922 Inconclusive mammogram: Secondary | ICD-10-CM | POA: Diagnosis not present

## 2018-01-27 DIAGNOSIS — Z853 Personal history of malignant neoplasm of breast: Secondary | ICD-10-CM | POA: Diagnosis not present

## 2018-01-27 DIAGNOSIS — Z9889 Other specified postprocedural states: Secondary | ICD-10-CM

## 2018-03-13 DIAGNOSIS — H26492 Other secondary cataract, left eye: Secondary | ICD-10-CM | POA: Diagnosis not present

## 2018-03-13 DIAGNOSIS — E113293 Type 2 diabetes mellitus with mild nonproliferative diabetic retinopathy without macular edema, bilateral: Secondary | ICD-10-CM | POA: Diagnosis not present

## 2018-03-18 DIAGNOSIS — Z23 Encounter for immunization: Secondary | ICD-10-CM | POA: Diagnosis not present

## 2018-03-18 NOTE — Progress Notes (Signed)
[facility] OFFICE PROGRESS NOTE  Date of Service:  03/19/2018   CC Dr. Mayra Neer Dr. Fanny Skates Dr. Arloa Koh Dr. Paula Compton  DIAGNOSIS: 72 y.o.  female with invasive lobular carcinoma of the left breast in the upper-outer quadrant stage IIA.  PRIOR THERAPY:  #1 patient originally presented to the multidisciplinary breast clinic in September 2013 with new diagnosis of left invasive lobular carcinoma by ultrasound measuring 1.6 and by MRI measuring 3 cm. She had undergone a needle core biopsy the tumor was lobular ER positive.  #2 she was seen by Dr. Valere Dross as well as Dr. Fanny Skates patient was interested in breast conservation. We recommended neoadjuvant antiestrogen therapy to try to reduce the size of the tumor for a good cosmetic result at this time of lumpectomy. She went on to receive letrozole 2.5 mg daily starting in 02/05/2012 through June 2014  #3 status post partial mastectomy with sentinel lymph node biopsy of the left breast. Final pathology did reveal residual disease measuring at T1cN0 ER positive PR negative HER-2/neu negative. Anterior margin was positive and it was reexcised. Oncotype score 29  #4 patient completed radiation therapy to the left breast on 02/18/2013.  #4 began letrozole 2.5 mg started 02/18/2013 for a total of 7 years  CURRENT THERAPY: Letrozole 2.5 mg daily  INTERVAL HISTORY: Elizabeth Hebert 72 y.o. female returns for follow up visit. She was last seen by me 6 months ago. Her 2019 mammogram was benign. She is here today by herself. She notes she is doing well with adequate energy and appetite. She lives with her husband and able to everything she wants to do.  She is still taking Letrozole, tolerating well with hot flashes. She has been managing it well but this does wake her up at night. She notes her BG has been elevated lately.  She had a fall recently in her upper left arm fracture. Her bone density is low  in that area. She has been on Boniva for this for a long time now.     MEDICAL HISTORY: Past Medical History:  Diagnosis Date  . Anemia   . Anginal pain ([place])    infreq  . Arthritis   . Breast cancer ([place]) 12/08/2012   left lumpectomy  . Broken arm 2011  . Cancer ([place])    breaast  . Colitis   . Depression   . Diabetes mellitus without complication ([place])   . Diverticulitis   . Fibroid tumor   . GERD (gastroesophageal reflux disease)   . H/O hiatal hernia   . Heart murmur   . History of cataract surgery 2012  . Hx of hernia repair   . Hx of radiation therapy 01/26/13- 02/18/13   left breast 4250 cGy 17 sessions  . Hypertension   . Myocardial infarction ([place])    stents x 2 when had mi 99  . Personal history of radiation therapy 12/08/2012    ALLERGIES:  is allergic to vasotec [enalaprilat].  MEDICATIONS:  Current Outpatient Medications  Medication Sig Dispense Refill  . aspirin 81 MG chewable tablet Chew 81 mg by mouth daily.    Marland Kitchen CALCIUM-MAGNESIUM-VITAMIN D PO Take 370 mg by mouth daily.    . cimetidine (TAGAMET) 200 MG tablet Take 200 mg by mouth 2 (two) times daily.    . digoxin (LANOXIN) 0.125 MG tablet Take 0.125 mg by mouth daily.    . Empagliflozin-Linagliptin (GLYXAMBI) 25-5 MG TABS Take by mouth daily.    Marland Kitchen  Exenatide ER (BYDUREON) 2 MG SUSR Inject 2 mg into the skin once a week.     . ezetimibe (ZETIA) 10 MG tablet Take 10 mg by mouth daily.    . ferrous sulfate 325 (65 FE) MG tablet Take 325 mg by mouth daily with breakfast.    . glimepiride (AMARYL) 4 MG tablet Take 4 mg by mouth daily before breakfast.    . ibandronate (BONIVA) 150 MG tablet Take 150 mg by mouth every 30 (thirty) days.   6  . letrozole (FEMARA) 2.5 MG tablet TAKE 1 TABLET (2.5 MG TOTAL) BY MOUTH DAILY. 90 tablet 3  . metFORMIN (GLUCOPHAGE) 1000 MG tablet Take 1,000 mg by mouth 2 (two) times daily with a meal.    . metoprolol succinate (TOPROL-XL) 50 MG 24 hr tablet Take 50 mg by mouth daily.  Take with or immediately following a meal.    . rosuvastatin (CRESTOR) 20 MG tablet Take 20 mg by mouth daily.    . sertraline (ZOLOFT) 50 MG tablet Take 50 mg by mouth daily.    . valsartan-hydrochlorothiazide (DIOVAN-HCT) 320-25 MG per tablet Take 1 tablet by mouth daily.  5   No current facility-administered medications for this visit.     SURGICAL HISTORY:  Past Surgical History:  Procedure Laterality Date  . APPENDECTOMY    . BREAST LUMPECTOMY Left 12/08/2012   left lumpectomy  . CHOLECYSTECTOMY    . COLON RESECTION    . EYE SURGERY Bilateral    cat   . HERNIA REPAIR     rt  . MASTECTOMY, PARTIAL Left 12/25/2012   Procedure: MASTECTOMY PARTIAL with re-excision of margins;  Surgeon: Adin Hector, MD;  Location: [facility];  Service: General;  Laterality: Left;  . PARTIAL MASTECTOMY WITH NEEDLE LOCALIZATION AND AXILLARY SENTINEL LYMPH NODE BX Left 12/08/2012   Procedure: LEFT PARTIAL MASTECTOMY WITH NEEDLE LOCALIZATION AND AXILLARY SENTINEL LYMPH NODE BIOPSIES TIMES FOUR;  Surgeon: Adin Hector, MD;  Location: [facility];  Service: General;  Laterality: Left;  . TOTAL ABDOMINAL HYSTERECTOMY  2001    REVIEW OF SYSTEMS:   Constitutional: Denies fevers, chills or abnormal night sweats (+) hot flashes  Eyes: Denies blurriness of vision, double vision or watery eyes Ears, nose, mouth, throat, and face: Denies mucositis or sore throat Respiratory: Denies cough, dyspnea or wheezes Cardiovascular: Denies palpitation, chest discomfort or lower extremity swelling Gastrointestinal:  Denies nausea, heartburn or change in bowel habits Skin: Denies abnormal skin rashes Lymphatics: Denies new lymphadenopathy or easy bruising Neurological:Denies numbness, tingling or new weaknesses Behavioral/Psych: Mood is stable, no new changes  All other systems were reviewed with the patient and are negative.   Health Maintenance Mammogram: 11/2015, normal  Colonoscopy: none  Bone  Density Scan: 02/2014, osteoporosis Pap Smear: s/p TAH/BSO 2001 Eye Exam: followed every 6 months due to diabetes Lipid Panel: followed every 6 months by PCP Vitamin D level: never   PHYSICAL EXAMINATION: Blood pressure (!) 148/68, pulse 86, temperature 98.3 F (36.8 C), temperature source Oral, resp. rate 17, height '5\' 1"'$  (1.549 m), weight 170 lb 14.4 oz (77.5 kg), SpO2 97 %. Body mass index is 32.29 kg/m.   GENERAL: Patient is a well appearing female in no acute distress HEENT:  Sclerae anicteric.  Oropharynx clear and moist. No ulcerations or evidence of oropharyngeal candidiasis. Neck is supple.  NODES:  No cervical, supraclavicular, or axillary lymphadenopathy palpated.  LUNGS:  Clear to auscultation bilaterally.  No wheezes or rhonchi. HEART:  Regular rate and rhythm. No murmur appreciated. ABDOMEN:  Soft, nontender.  Positive, normoactive bowel sounds. No organomegaly palpated. MSK:  No focal spinal tenderness to palpation. Full range of motion bilaterally in the upper extremities. EXTREMITIES:  No peripheral edema.   SKIN:  Clear with no obvious rashes or skin changes. No nail dyscrasia. NEURO:  Nonfocal. Well oriented.  Appropriate affect. BREAST EXAM: S/p left breast lumpectomy: surgical incision healed, right breast benign. There is b/l nipple inversion but no nodularity, masses, or sign of recurrence. Benign breast exam. ECOG PERFORMANCE STATUS: 0 - Asymptomatic  LABORATORY DATA:  CBC Latest Ref Rng & Units 03/19/2018 09/17/2017 03/18/2017  WBC 4.0 - 10.5 K/uL 10.3 8.8 10.5(H)  Hemoglobin 12.0 - 15.0 g/dL 14.9 14.3 14.5  Hematocrit 36.0 - 46.0 % 46.2(H) 43.8 44.0  Platelets 150 - 400 K/uL 213 229 224    CMP Latest Ref Rng & Units 03/19/2018 09/17/2017 03/18/2017  Glucose 70 - 99 mg/dL 187(H) 235(H) 201(H)  BUN 8 - 23 mg/dL 9 13 8.8  Creatinine 0.44 - 1.00 mg/dL 0.96 1.17(H) 1.0  Sodium 135 - 145 mmol/L 141 141 140  Potassium 3.5 - 5.1 mmol/L 4.0 3.9 3.8  Chloride 98  - 111 mmol/L 102 102 -  CO2 22 - 32 mmol/L '28 29 27  '$ Calcium 8.9 - 10.3 mg/dL 10.3 10.4 10.2  Total Protein 6.5 - 8.1 g/dL 6.8 7.0 6.8  Total Bilirubin 0.3 - 1.2 mg/dL 0.7 0.4 0.59  Alkaline Phos 38 - 126 U/L 59 63 51  AST 15 - 41 U/L '23 23 22  '$ ALT 0 - 44 U/L 32 29 27   01/27/2018 Mammogram IMPRESSION: No mammographic evidence of malignancy in either breast, status post left lumpectomy.   Diagnostic Mammogram 11/12/16 IMPRESSION: No evidence of malignancy within either breast. Stable postsurgical changes within the left breast.  DG Bone Density 07/24/2016 ASSESSMENT: The BMD measured at Forearm Radius 33% is 0.636 g/cm2 with a T-score of -2.8. This patient is considered osteoporotic according to [facility]) criteria. Lumbar spine was not utilized due to being previously excluded. There has been a statistically significant increase in BMD of Left hip since prior exam dated 02/14/2014.  Bilateral diagnostic mammogram 11/10/2015 IMPRESSION: No evidence of malignancy in either breast. RECOMMENDATION: Bilateral diagnostic mammogram in 1 year is recommended.  ASSESSMENT:  72 y.o. female with  1. Clinical stage II invasive lobular carcinoma of left breast, s/p neoadjuvant endocrine therapy, pTI CN0, ER +70% PR negative HER-2/neu negative. -the patient originally presented to the multidisciplinary breast clinic in September 2013 with new diagnosis of left invasive lobular carcinoma by ultrasound measuring 1.6 and by MRI measuring 3 cm. -Neoadjuvant antiestrogen therapy was recommended to try to reduce the size of the tumor for a good cosmetic result at that time. She went on to receive letrozole 2.5 mg daily starting in 02/05/2012 through June 2014 -She underwent a partial mastectomy with sentinel lymph node biopsy of the left breast. Final pathology did reveal residual disease measuring at T1cN0 ER positive PR negative HER-2/neu negative. Anterior margin was  positive and it was reexcised. Oncotype score 29 -She completed radiation therapy to the left breast from Sept 2014 -October 2014 -She will continue letrozole once daily, for total 7 years, until September 2021.  -We'll continue breast cancer surveillance. I encouraged her to do self exam.  -She is clinically doing well. Lab reviewed, her CBC is overall WNL and CMP is still pending. Her physical exam and her 01/2018  mammogram were unremarkable. There is no clinical concern for recurrence. -Next Mammogram in 01/2019.  -She is more than 5 years since her diagnosis. I will follow up with her yearly now. She will continue to follow up with Dr. Brigitte Pulse closely.    2. Osteoporosis -She was on prolia, switch to Boniva by her primary care physician Dr. Brigitte Pulse  -Repeat bone density scan every 2 years. Bone Density on 07/24/16 was T-score -2.8; osteoporotic. -We previously discussed letrozole may worse her osteoporosis.  -Continue Boniva, continue calcium and vitamin D and regular exercise. -Next DEXA in 01/2019  3.  DM, HTN, CAD and other medical issues  -She will continue to follow up with PCP Dr. Brigitte Pulse -Her glucose has been elevated in the 200 range previously which could be contributing to her decreased kidney function. She will continue to follow up with Dr. Brigitte Pulse closely.   4. Hot Flashes -secondary to Letrozole and have not improved since she has been on medication for about 5 years. This does wake her up at night  -I discussed managing this with Neurontin or SSRI. She is already on Zoloft '50mg'$ . I suggest she increase Zoloft to '100mg'$  to see if this helps her and she can tolerate it. She is agreeable    Plan -She is clinically doing well, no signs of recurrence.  -Continue Letrozole, refilled today  -Increase Zoloft to '100mg'$  daily for hot flush  -Lab and f/u in one year -Mammogram and DEXA in 01/2019   All questions were answered. The patient knows to call the clinic with any problems, questions or  concerns. I spent 15 minutes counseling the patient face to face. The total time spent in the appointment was 20 minutes and more than 50% was on counseling.  Oneal Deputy, am acting as scribe for Truitt Merle, MD.    I have reviewed the above documentation for accuracy and completeness, and I agree with the above.    Truitt Merle  03/19/2018

## 2018-03-19 ENCOUNTER — Inpatient Hospital Stay (HOSPITAL_BASED_OUTPATIENT_CLINIC_OR_DEPARTMENT_OTHER): Payer: Medicare Other | Admitting: Hematology

## 2018-03-19 ENCOUNTER — Telehealth: Payer: Self-pay | Admitting: Hematology

## 2018-03-19 ENCOUNTER — Inpatient Hospital Stay: Payer: Medicare Other | Attending: Hematology

## 2018-03-19 VITALS — BP 148/68 | HR 86 | Temp 98.3°F | Resp 17 | Ht 61.0 in | Wt 170.9 lb

## 2018-03-19 DIAGNOSIS — C50411 Malignant neoplasm of upper-outer quadrant of right female breast: Secondary | ICD-10-CM

## 2018-03-19 DIAGNOSIS — E119 Type 2 diabetes mellitus without complications: Secondary | ICD-10-CM | POA: Insufficient documentation

## 2018-03-19 DIAGNOSIS — Z79811 Long term (current) use of aromatase inhibitors: Secondary | ICD-10-CM | POA: Diagnosis not present

## 2018-03-19 DIAGNOSIS — C50412 Malignant neoplasm of upper-outer quadrant of left female breast: Secondary | ICD-10-CM | POA: Insufficient documentation

## 2018-03-19 DIAGNOSIS — N951 Menopausal and female climacteric states: Secondary | ICD-10-CM

## 2018-03-19 DIAGNOSIS — I2510 Atherosclerotic heart disease of native coronary artery without angina pectoris: Secondary | ICD-10-CM | POA: Diagnosis not present

## 2018-03-19 DIAGNOSIS — I10 Essential (primary) hypertension: Secondary | ICD-10-CM | POA: Diagnosis not present

## 2018-03-19 DIAGNOSIS — M810 Age-related osteoporosis without current pathological fracture: Secondary | ICD-10-CM | POA: Diagnosis not present

## 2018-03-19 DIAGNOSIS — Z170 Estrogen receptor positive status [ER+]: Secondary | ICD-10-CM | POA: Diagnosis not present

## 2018-03-19 DIAGNOSIS — E2839 Other primary ovarian failure: Secondary | ICD-10-CM

## 2018-03-19 DIAGNOSIS — Z1231 Encounter for screening mammogram for malignant neoplasm of breast: Secondary | ICD-10-CM

## 2018-03-19 LAB — CBC WITH DIFFERENTIAL/PLATELET
Abs Immature Granulocytes: 0.04 10*3/uL (ref 0.00–0.07)
Basophils Absolute: 0.1 10*3/uL (ref 0.0–0.1)
Basophils Relative: 1 %
EOS ABS: 0.7 10*3/uL — AB (ref 0.0–0.5)
EOS PCT: 7 %
HEMATOCRIT: 46.2 % — AB (ref 36.0–46.0)
Hemoglobin: 14.9 g/dL (ref 12.0–15.0)
Immature Granulocytes: 0 %
LYMPHS ABS: 1.3 10*3/uL (ref 0.7–4.0)
Lymphocytes Relative: 13 %
MCH: 28.4 pg (ref 26.0–34.0)
MCHC: 32.3 g/dL (ref 30.0–36.0)
MCV: 88 fL (ref 80.0–100.0)
MONO ABS: 0.8 10*3/uL (ref 0.1–1.0)
Monocytes Relative: 8 %
Neutro Abs: 7.4 10*3/uL (ref 1.7–7.7)
Neutrophils Relative %: 71 %
Platelets: 213 10*3/uL (ref 150–400)
RBC: 5.25 MIL/uL — ABNORMAL HIGH (ref 3.87–5.11)
RDW: 14.8 % (ref 11.5–15.5)
WBC: 10.3 10*3/uL (ref 4.0–10.5)
nRBC: 0 % (ref 0.0–0.2)

## 2018-03-19 LAB — COMPREHENSIVE METABOLIC PANEL
ALBUMIN: 3.7 g/dL (ref 3.5–5.0)
ALK PHOS: 59 U/L (ref 38–126)
ALT: 32 U/L (ref 0–44)
AST: 23 U/L (ref 15–41)
Anion gap: 11 (ref 5–15)
BUN: 9 mg/dL (ref 8–23)
CALCIUM: 10.3 mg/dL (ref 8.9–10.3)
CO2: 28 mmol/L (ref 22–32)
Chloride: 102 mmol/L (ref 98–111)
Creatinine, Ser: 0.96 mg/dL (ref 0.44–1.00)
GFR calc non Af Amer: 57 mL/min — ABNORMAL LOW (ref >60)
GLUCOSE: 187 mg/dL — AB (ref 70–99)
Potassium: 4 mmol/L (ref 3.5–5.1)
Sodium: 141 mmol/L (ref 135–145)
Total Bilirubin: 0.7 mg/dL (ref 0.3–1.2)
Total Protein: 6.8 g/dL (ref 6.5–8.1)

## 2018-03-19 MED ORDER — LETROZOLE 2.5 MG PO TABS: ORAL_TABLET | ORAL | 3 refills | Status: DC

## 2018-03-19 NOTE — Progress Notes (Deleted)
[facility]  [phone] [phone]  Clinic Follow up Note   Patient Care Team: Mayra Neer, MD as PCP - General (Family Medicine) 03/19/2018  DIAGNOSIS: 72 y.o.  female with invasive lobular carcinoma of the left breast in the upper-outer quadrant stage IIA.  PRIOR THERAPY:  #1 patient originally presented to the multidisciplinary breast clinic in September 2013 with new diagnosis of left invasive lobular carcinoma by ultrasound measuring 1.6 and by MRI measuring 3 cm. She had undergone a needle core biopsy the tumor was lobular ER positive.  #2 she was seen by Dr. Valere Dross as well as Dr. Fanny Skates patient was interested in breast conservation. We recommended neoadjuvant antiestrogen therapy to try to reduce the size of the tumor for a good cosmetic result at this time of lumpectomy. She went on to receive letrozole 2.5 mg daily starting in 02/05/2012 through June 2014  #3 status post partial mastectomy with sentinel lymph node biopsy of the left breast. Final pathology did reveal residual disease measuring at T1cN0 ER positive PR negative HER-2/neu negative. Anterior margin was positive and it was reexcised. Oncotype score 29  #4 patient completed radiation therapy to the left breast on 02/18/2013.  #4 began letrozole 2.5 mg started 02/18/2013 for a total of 7 years  CURRENT THERAPY: Letrozole 2.5 mg daily  INTERVAL HISTORY: Please see below for problem oriented charting.  REVIEW OF SYSTEMS:   Constitutional: Denies fevers, chills or abnormal weight loss Eyes: Denies blurriness of vision Ears, nose, mouth, throat, and face: Denies mucositis or sore throat Respiratory: Denies cough, dyspnea or wheezes Cardiovascular: Denies palpitation, chest discomfort or lower extremity swelling Gastrointestinal:  Denies nausea, heartburn or change in bowel habits Skin: Denies abnormal skin rashes Lymphatics: Denies new lymphadenopathy or  easy bruising Neurological:Denies numbness, tingling or new weaknesses Behavioral/Psych: Mood is stable, no new changes  All other systems were reviewed with the patient and are negative.  MEDICAL HISTORY:  Past Medical History:  Diagnosis Date  . Anemia   . Anginal pain ([place])    infreq  . Arthritis   . Breast cancer ([place]) 12/08/2012   left lumpectomy  . Broken arm 2011  . Cancer ([place])    breaast  . Colitis   . Depression   . Diabetes mellitus without complication ([place])   . Diverticulitis   . Fibroid tumor   . GERD (gastroesophageal reflux disease)   . H/O hiatal hernia   . Heart murmur   . History of cataract surgery 2012  . Hx of hernia repair   . Hx of radiation therapy 01/26/13- 02/18/13   left breast 4250 cGy 17 sessions  . Hypertension   . Myocardial infarction ([place])    stents x 2 when had mi 99  . Personal history of radiation therapy 12/08/2012    SURGICAL HISTORY: Past Surgical History:  Procedure Laterality Date  . APPENDECTOMY    . BREAST LUMPECTOMY Left 12/08/2012   left lumpectomy  . CHOLECYSTECTOMY    . COLON RESECTION    . EYE SURGERY Bilateral    cat   . HERNIA REPAIR     rt  . MASTECTOMY, PARTIAL Left 12/25/2012   Procedure: MASTECTOMY PARTIAL with re-excision of margins;  Surgeon: Adin Hector, MD;  Location: [facility];  Service: General;  Laterality: Left;  . PARTIAL MASTECTOMY WITH NEEDLE LOCALIZATION AND AXILLARY SENTINEL LYMPH NODE BX Left 12/08/2012   Procedure: LEFT PARTIAL MASTECTOMY WITH NEEDLE LOCALIZATION AND AXILLARY SENTINEL LYMPH  NODE BIOPSIES TIMES FOUR;  Surgeon: Adin Hector, MD;  Location: [facility];  Service: General;  Laterality: Left;  . TOTAL ABDOMINAL HYSTERECTOMY  2001    I have reviewed the social history and family history with the patient and they are unchanged from previous note.  ALLERGIES:  is allergic to vasotec [enalaprilat].  MEDICATIONS:  Current Outpatient Medications  Medication Sig  Dispense Refill  . aspirin 81 MG chewable tablet Chew 81 mg by mouth daily.    Marland Kitchen CALCIUM-MAGNESIUM-VITAMIN D PO Take 370 mg by mouth daily.    . cimetidine (TAGAMET) 200 MG tablet Take 200 mg by mouth 2 (two) times daily.    . digoxin (LANOXIN) 0.125 MG tablet Take 0.125 mg by mouth daily.    . Empagliflozin-Linagliptin (GLYXAMBI) 25-5 MG TABS Take by mouth daily.    . Exenatide ER (BYDUREON) 2 MG SUSR Inject 2 mg into the skin once a week.     . ezetimibe (ZETIA) 10 MG tablet Take 10 mg by mouth daily.    . ferrous sulfate 325 (65 FE) MG tablet Take 325 mg by mouth daily with breakfast.    . glimepiride (AMARYL) 4 MG tablet Take 4 mg by mouth daily before breakfast.    . ibandronate (BONIVA) 150 MG tablet Take 150 mg by mouth every 30 (thirty) days.   6  . letrozole (FEMARA) 2.5 MG tablet TAKE 1 TABLET (2.5 MG TOTAL) BY MOUTH DAILY. 90 tablet 3  . metFORMIN (GLUCOPHAGE) 1000 MG tablet Take 1,000 mg by mouth 2 (two) times daily with a meal.    . metoprolol succinate (TOPROL-XL) 50 MG 24 hr tablet Take 50 mg by mouth daily. Take with or immediately following a meal.    . rosuvastatin (CRESTOR) 20 MG tablet Take 20 mg by mouth daily.    . sertraline (ZOLOFT) 50 MG tablet Take 50 mg by mouth daily.    . valsartan-hydrochlorothiazide (DIOVAN-HCT) 320-25 MG per tablet Take 1 tablet by mouth daily.  5   No current facility-administered medications for this visit.     PHYSICAL EXAMINATION: ECOG PERFORMANCE STATUS: {CHL ONC ECOG PS:[phone]}  There were no vitals filed for this visit. There were no vitals filed for this visit.  GENERAL:alert, no distress and comfortable SKIN: skin color, texture, turgor are normal, no rashes or significant lesions EYES: normal, Conjunctiva are pink and non-injected, sclera clear OROPHARYNX:no exudate, no erythema and lips, buccal mucosa, and tongue normal  NECK: supple, thyroid normal size, non-tender, without nodularity LYMPH:  no palpable lymphadenopathy  in the cervical, axillary or inguinal LUNGS: clear to auscultation and percussion with normal breathing effort HEART: regular rate & rhythm and no murmurs and no lower extremity edema ABDOMEN:abdomen soft, non-tender and normal bowel sounds Musculoskeletal:no cyanosis of digits and no clubbing  NEURO: alert & oriented x 3 with fluent speech, no focal motor/sensory deficits  LABORATORY DATA:  I have reviewed the data as listed CBC Latest Ref Rng & Units 09/17/2017 03/18/2017 09/11/2016  WBC 3.9 - 10.3 K/uL 8.8 10.5(H) 9.4  Hemoglobin 11.6 - 15.9 g/dL 14.3 14.5 13.7  Hematocrit 34.8 - 46.6 % 43.8 44.0 41.3  Platelets 145 - 400 K/uL 229 224 212     CMP Latest Ref Rng & Units 09/17/2017 03/18/2017 09/11/2016  Glucose 70 - 140 mg/dL 235(H) 201(H) 155(H)  BUN 7 - 26 mg/dL 13 8.8 10.3  Creatinine 0.60 - 1.10 mg/dL 1.17(H) 1.0 0.9  Sodium 136 - 145 mmol/L 141 140 142  Potassium  3.5 - 5.1 mmol/L 3.9 3.8 3.5  Chloride 98 - 109 mmol/L 102 - -  CO2 22 - 29 mmol/L '29 27 28  '$ Calcium 8.4 - 10.4 mg/dL 10.4 10.2 10.2  Total Protein 6.4 - 8.3 g/dL 7.0 6.8 6.7  Total Bilirubin 0.2 - 1.2 mg/dL 0.4 0.59 0.56  Alkaline Phos 40 - 150 U/L 63 51 45  AST 5 - 34 U/L '23 22 25  '$ ALT 0 - 55 U/L '29 27 30   '$ MAMMO IMPRESSION: 01/27/18  No mammographic evidence of malignancy in either breast, status post left lumpectomy. RECOMMENDATION: Screening mammogram in one year.(Code:SM-B-01Y)   RADIOGRAPHIC STUDIES: I have personally reviewed the radiological images as listed and agreed with the findings in the report. No results found.   ASSESSMENT & PLAN: 72 y.o. female with  1. Clinical stage II invasive lobular carcinoma of left breast, s/p neoadjuvant endocrine therapy, pTI CN0, ER +70% PR negative HER-2/neu negative. -initially presented September 2013 with new diagnosis of left invasive lobular carcinoma by ultrasound measuring 1.6 and by MRI measuring 3 cm.  -Neoadjuvant antiestrogen therapy was recommended  to try to reduce the size of the tumor for a good cosmetic result at that time. She went on to receive letrozole 2.5 mg daily starting in 02/05/2012 through June 2014 -She underwent a partial mastectomy with sentinel lymph node biopsy of the left breast. Final pathology did reveal residual disease measuring at T1cN0 ER positive PR negative HER-2/neu negative. Anterior margin was positive and it was reexcised. Oncotype score 29 -She completed radiation therapy to the left breast from Sept 2014 -October 2014 -Dr. Burr Medico previously recommended to continue letrozole once daily, for total 7 years, until September 2021  -F/u in 6 months and then once a year, will alternate with her annual check up with her PCP    2. Osteoporosis -She was on prolia, switch to Boniva by her primary care physician Dr. Brigitte Pulse  -Repeat bone density scan every 2 years. Bone Density on 07/24/16 was T-score -2.8; osteoporotic. -previously discussed letrozole may worse her osteoporosis. I encouraged her to continue calcium and vitamin D and regular exercise.  3.  DM, HTN, CAD and other medical issues  -She will continue to follow up with PCP Dr. Brigitte Pulse  Plan No problem-specific Assessment & Plan notes found for this encounter.   No orders of the defined types were placed in this encounter.  All questions were answered. The patient knows to call the clinic with any problems, questions or concerns. No barriers to learning was detected. I spent {CHL ONC TIME VISIT - IDUPB:[id number]} counseling the patient face to face. The total time spent in the appointment was {CHL ONC TIME VISIT - SXQKS:[id number]} and more than 50% was on counseling and review of test results     Alla Feeling, NP 03/19/18

## 2018-03-19 NOTE — Telephone Encounter (Signed)
Gave pt avs and calendar  °

## 2018-03-20 ENCOUNTER — Encounter: Payer: Self-pay | Admitting: Hematology

## 2018-04-08 DIAGNOSIS — Z7984 Long term (current) use of oral hypoglycemic drugs: Secondary | ICD-10-CM | POA: Diagnosis not present

## 2018-04-08 DIAGNOSIS — I119 Hypertensive heart disease without heart failure: Secondary | ICD-10-CM | POA: Diagnosis not present

## 2018-04-08 DIAGNOSIS — F322 Major depressive disorder, single episode, severe without psychotic features: Secondary | ICD-10-CM | POA: Diagnosis not present

## 2018-04-08 DIAGNOSIS — I2510 Atherosclerotic heart disease of native coronary artery without angina pectoris: Secondary | ICD-10-CM | POA: Diagnosis not present

## 2018-04-08 DIAGNOSIS — R319 Hematuria, unspecified: Secondary | ICD-10-CM | POA: Diagnosis not present

## 2018-04-08 DIAGNOSIS — Z6832 Body mass index (BMI) 32.0-32.9, adult: Secondary | ICD-10-CM | POA: Diagnosis not present

## 2018-04-08 DIAGNOSIS — E11319 Type 2 diabetes mellitus with unspecified diabetic retinopathy without macular edema: Secondary | ICD-10-CM | POA: Diagnosis not present

## 2018-04-08 DIAGNOSIS — E7800 Pure hypercholesterolemia, unspecified: Secondary | ICD-10-CM | POA: Diagnosis not present

## 2018-04-08 DIAGNOSIS — R300 Dysuria: Secondary | ICD-10-CM | POA: Diagnosis not present

## 2018-04-08 DIAGNOSIS — E669 Obesity, unspecified: Secondary | ICD-10-CM | POA: Diagnosis not present

## 2018-04-08 DIAGNOSIS — J069 Acute upper respiratory infection, unspecified: Secondary | ICD-10-CM | POA: Diagnosis not present

## 2018-04-21 DIAGNOSIS — R300 Dysuria: Secondary | ICD-10-CM | POA: Diagnosis not present

## 2018-05-11 DIAGNOSIS — N390 Urinary tract infection, site not specified: Secondary | ICD-10-CM | POA: Diagnosis not present

## 2018-08-20 DIAGNOSIS — E669 Obesity, unspecified: Secondary | ICD-10-CM | POA: Diagnosis not present

## 2018-08-20 DIAGNOSIS — E7800 Pure hypercholesterolemia, unspecified: Secondary | ICD-10-CM | POA: Diagnosis not present

## 2018-08-20 DIAGNOSIS — K5090 Crohn's disease, unspecified, without complications: Secondary | ICD-10-CM | POA: Diagnosis not present

## 2018-08-20 DIAGNOSIS — M810 Age-related osteoporosis without current pathological fracture: Secondary | ICD-10-CM | POA: Diagnosis not present

## 2018-08-20 DIAGNOSIS — I700 Atherosclerosis of aorta: Secondary | ICD-10-CM | POA: Diagnosis not present

## 2018-08-20 DIAGNOSIS — C50919 Malignant neoplasm of unspecified site of unspecified female breast: Secondary | ICD-10-CM | POA: Diagnosis not present

## 2018-08-20 DIAGNOSIS — Z0000 Encounter for general adult medical examination without abnormal findings: Secondary | ICD-10-CM | POA: Diagnosis not present

## 2018-08-20 DIAGNOSIS — E11319 Type 2 diabetes mellitus with unspecified diabetic retinopathy without macular edema: Secondary | ICD-10-CM | POA: Diagnosis not present

## 2018-08-20 DIAGNOSIS — I119 Hypertensive heart disease without heart failure: Secondary | ICD-10-CM | POA: Diagnosis not present

## 2018-08-20 DIAGNOSIS — I2510 Atherosclerotic heart disease of native coronary artery without angina pectoris: Secondary | ICD-10-CM | POA: Diagnosis not present

## 2018-08-20 DIAGNOSIS — F322 Major depressive disorder, single episode, severe without psychotic features: Secondary | ICD-10-CM | POA: Diagnosis not present

## 2018-08-20 DIAGNOSIS — K219 Gastro-esophageal reflux disease without esophagitis: Secondary | ICD-10-CM | POA: Diagnosis not present

## 2018-09-24 DIAGNOSIS — Z7984 Long term (current) use of oral hypoglycemic drugs: Secondary | ICD-10-CM | POA: Diagnosis not present

## 2018-09-24 DIAGNOSIS — E11319 Type 2 diabetes mellitus with unspecified diabetic retinopathy without macular edema: Secondary | ICD-10-CM | POA: Diagnosis not present

## 2018-09-24 DIAGNOSIS — N390 Urinary tract infection, site not specified: Secondary | ICD-10-CM | POA: Diagnosis not present

## 2018-12-24 DIAGNOSIS — I119 Hypertensive heart disease without heart failure: Secondary | ICD-10-CM | POA: Diagnosis not present

## 2018-12-24 DIAGNOSIS — E11319 Type 2 diabetes mellitus with unspecified diabetic retinopathy without macular edema: Secondary | ICD-10-CM | POA: Diagnosis not present

## 2018-12-24 DIAGNOSIS — E669 Obesity, unspecified: Secondary | ICD-10-CM | POA: Diagnosis not present

## 2018-12-24 DIAGNOSIS — Z7984 Long term (current) use of oral hypoglycemic drugs: Secondary | ICD-10-CM | POA: Diagnosis not present

## 2018-12-24 DIAGNOSIS — Z6831 Body mass index (BMI) 31.0-31.9, adult: Secondary | ICD-10-CM | POA: Diagnosis not present

## 2018-12-24 DIAGNOSIS — Z7189 Other specified counseling: Secondary | ICD-10-CM | POA: Diagnosis not present

## 2018-12-24 DIAGNOSIS — E7800 Pure hypercholesterolemia, unspecified: Secondary | ICD-10-CM | POA: Diagnosis not present

## 2018-12-24 DIAGNOSIS — F322 Major depressive disorder, single episode, severe without psychotic features: Secondary | ICD-10-CM | POA: Diagnosis not present

## 2019-03-12 NOTE — Progress Notes (Signed)
[facility]   [phone]) [phone] Fax:(336) [phone]   Clinic Follow up Note   Patient Care Team: Mayra Neer, MD as PCP - General (Family Medicine)  Date of Service:  03/17/2019  CHIEF COMPLAINT: F/u of left breast cancer   SUMMARY OF ONCOLOGIC HISTORY: Oncology History  Cancer of upper-outer quadrant of female breast ([place])  01/24/2012 Initial Biopsy   Diagnosis 01/24/12 Breast, left, needle core biopsy, mass, 1 o'clock - POSITIVE FOR INVASIVE MAMMARY CARCINOMA WITH LOBULAR FEATURES. - SEE COMMENT. Results IMMUNOHISTOCHEMICAL AND MORPHOMETRIC ANALYSIS BY THE AUTOMATED CELLULAR IMAGING SYSTEM (ACIS) This invasive carcinoma shows the following breast prognostic profile. Estrogen Receptor (Negative, <1%): 100%,POSITIVE, STRONG STAINING INTENSITY Progesterone Receptor (Negative, <1%): 2%, POSITIVE,STRONG STAINING INTENSITY HER2 negative    01/30/2012 Initial Diagnosis   Cancer of upper-outer quadrant of female breast ([place])   02/05/2012 - 11/2012 Neo-Adjuvant Anti-estrogen oral therapy   We recommended neoadjuvant antiestrogen therapy to try to reduce the size of the tumor for a good cosmetic result at this time of lumpectomy. She went on to receive letrozole 2.5 mg daily starting in 02/05/2012 through June 2014    12/08/2012 Pathology Results   Diagnosis 12/08/12 1. Lymph node, sentinel, biopsy, left axilla, node #1 - THERE IS NO EVIDENCE OF CARCINOMA IN 1 OF 1 LYMPH NODE (0/1). - SEE COMMENT. 2. Breast, lumpectomy, left - INVASIVE LOBULAR CARCINOMA, GRADE II/III, SPANNING 1.6 CM. - LOBULAR CARCINOMA IN SITU. - INVASIVE CARCINOMA IS BROADLY LESS THAN 0.1 CM TO THE ANTERIOR MARGIN - SEE ONCOLOGY TABLE BELOW. 3. Lymph node, sentinel, biopsy, left axilla, node #2 - THERE IS NO EVIDENCE OF CARCINOMA IN 1 OF 1 LYMPH NODE (0/1) 4. Lymph node, sentinel, biopsy, left axilla, node #3 - THERE IS NO EVIDENCE OF CARCINOMA IN 1 OF 1 LYMPH NODE (0/1). 5. Lymph node,  sentinel, biopsy, left axilla, node #4 - THERE IS NO EVIDENCE OF CARCINOMA IN 1 OF 1 LYMPH NODE (0/1). Estrogen Receptor: 78%, POSITIVE, STRONG STAINING INTENSITY Progesterone Receptor: 0%, NEGATIVE HER-2/NEU BY CISH - NO AMPLIFICATION OF HER-2 DETECTED.   12/25/2012 Pathology Results   RE-excision surgery 12/25/12 Diagnosis Breast, excision, Left new anterior margin - BENIGN BREAST PARENCHYMA WITH FAT NECROSIS, CONSISTENT WITH PRIOR SURGICAL PROCEDURE. - THERE IS NO EVIDENCE OF MALIGNANCY. - SEE COMMENT.    Oncotype testing   Oncotype score 29    - 02/18/2013 Radiation Therapy   patient completed radiation therapy to the left breast on 02/18/2013.   02/18/2013 -  Anti-estrogen oral therapy   began letrozole 2.5 mg started 02/18/2013 for a total of 7 years    DIAGNOSIS: 73 y.o.  female with invasive lobular carcinoma of the left breast in the upper-outer quadrant stage IIA.  CURRENT THERAPY:  Letrozole 2.'5mg'$  daily   INTERVAL HISTORY:  Elizabeth Hebert is here for a follow up left breast cancer. She presents to the clinic alone. She notes she is doing well. She has been staying in the home or with family. She mostly is not in public. She notes she is tolerating Letrozole well with manageable hot flashes. I reviewed her medication list. Her PCP switched her DM medication for insulin. She has been on it for several months. She sees her PCP 1-2 times a year.    REVIEW OF SYSTEMS:   Constitutional: Denies fevers, chills or abnormal weight loss (+) Manageable hot flashes  Eyes: Denies blurriness of vision Ears, nose, mouth, throat, and face: Denies mucositis or sore throat Respiratory: Denies cough,  dyspnea or wheezes Cardiovascular: Denies palpitation, chest discomfort or lower extremity swelling Gastrointestinal:  Denies nausea, heartburn or change in bowel habits Skin: Denies abnormal skin rashes Lymphatics: Denies new lymphadenopathy or easy bruising Neurological:Denies  numbness, tingling or new weaknesses Behavioral/Psych: Mood is stable, no new changes  All other systems were reviewed with the patient and are negative.  MEDICAL HISTORY:  Past Medical History:  Diagnosis Date  . Anemia   . Anginal pain ([place])    infreq  . Arthritis   . Breast cancer ([place]) 12/08/2012   left lumpectomy  . Broken arm 2011  . Cancer ([place])    breaast  . Colitis   . Depression   . Diabetes mellitus without complication ([place])   . Diverticulitis   . Fibroid tumor   . GERD (gastroesophageal reflux disease)   . H/O hiatal hernia   . Heart murmur   . History of cataract surgery 2012  . Hx of hernia repair   . Hx of radiation therapy 01/26/13- 02/18/13   left breast 4250 cGy 17 sessions  . Hypertension   . Myocardial infarction ([place])    stents x 2 when had mi 99  . Personal history of radiation therapy 12/08/2012    SURGICAL HISTORY: Past Surgical History:  Procedure Laterality Date  . APPENDECTOMY    . BREAST LUMPECTOMY Left 12/08/2012   left lumpectomy  . CHOLECYSTECTOMY    . COLON RESECTION    . EYE SURGERY Bilateral    cat   . HERNIA REPAIR     rt  . MASTECTOMY, PARTIAL Left 12/25/2012   Procedure: MASTECTOMY PARTIAL with re-excision of margins;  Surgeon: Adin Hector, MD;  Location: [facility];  Service: General;  Laterality: Left;  . PARTIAL MASTECTOMY WITH NEEDLE LOCALIZATION AND AXILLARY SENTINEL LYMPH NODE BX Left 12/08/2012   Procedure: LEFT PARTIAL MASTECTOMY WITH NEEDLE LOCALIZATION AND AXILLARY SENTINEL LYMPH NODE BIOPSIES TIMES FOUR;  Surgeon: Adin Hector, MD;  Location: [facility];  Service: General;  Laterality: Left;  . TOTAL ABDOMINAL HYSTERECTOMY  2001    I have reviewed the social history and family history with the patient and they are unchanged from previous note.  ALLERGIES:  is allergic to vasotec [enalaprilat].  MEDICATIONS:  Current Outpatient Medications  Medication Sig Dispense Refill  . aspirin 81 MG chewable  tablet Chew 81 mg by mouth daily.    Marland Kitchen CALCIUM-MAGNESIUM-VITAMIN D PO Take 370 mg by mouth daily.    . cimetidine (TAGAMET) 200 MG tablet Take 200 mg by mouth 2 (two) times daily.    . digoxin (LANOXIN) 0.125 MG tablet Take 0.125 mg by mouth daily.    Marland Kitchen ezetimibe (ZETIA) 10 MG tablet Take 10 mg by mouth daily.    . ferrous sulfate 325 (65 FE) MG tablet Take 325 mg by mouth daily with breakfast.    . ibandronate (BONIVA) 150 MG tablet Take 150 mg by mouth every 30 (thirty) days.   6  . letrozole (FEMARA) 2.5 MG tablet TAKE 1 TABLET (2.5 MG TOTAL) BY MOUTH DAILY. 90 tablet 3  . metFORMIN (GLUCOPHAGE) 1000 MG tablet Take 1,000 mg by mouth 2 (two) times daily with a meal.    . metoprolol succinate (TOPROL-XL) 50 MG 24 hr tablet Take 50 mg by mouth daily. Take with or immediately following a meal.    . rosuvastatin (CRESTOR) 20 MG tablet Take 20 mg by mouth daily.    . sertraline (ZOLOFT) 50 MG tablet Take 50  mg by mouth daily.    . valsartan-hydrochlorothiazide (DIOVAN-HCT) 320-25 MG per tablet Take 1 tablet by mouth daily.  5  . Empagliflozin-Linagliptin (GLYXAMBI) 25-5 MG TABS Take by mouth daily.    . Exenatide ER (BYDUREON) 2 MG SUSR Inject 2 mg into the skin once a week.     Marland Kitchen glimepiride (AMARYL) 4 MG tablet Take 4 mg by mouth daily before breakfast.    . potassium chloride (KLOR-CON) 10 MEQ tablet Take 1 tablet (10 mEq total) by mouth daily. 10 tablet 0   No current facility-administered medications for this visit.     PHYSICAL EXAMINATION: ECOG PERFORMANCE STATUS: 0 - Asymptomatic  Vitals:   03/17/19 1057  BP: (!) 157/77  Pulse: 99  Resp: 16  Temp: 98.9 F (37.2 C)  SpO2: 95%   Filed Weights   03/17/19 1057  Weight: 171 lb 8 oz (77.8 kg)    GENERAL:alert, no distress and comfortable SKIN: skin color, texture, turgor are normal, no rashes or significant lesions EYES: normal, Conjunctiva are pink and non-injected, sclera clear  NECK: supple, thyroid normal size, non-tender,  without nodularity LYMPH:  no palpable lymphadenopathy in the cervical, axillary  LUNGS: clear to auscultation and percussion with normal breathing effort HEART: regular rate & rhythm and no murmurs (+) Mild b/l lower extremity edema ABDOMEN:abdomen soft, non-tender and normal bowel sounds Musculoskeletal:no cyanosis of digits and no clubbing  NEURO: alert & oriented x 3 with fluent speech, no focal motor/sensory deficits BREAST: S/p partial left mastectomy: surgical incision healed well. (+) Mild left nipple inversion. No palpable mass, nodules or adenopathy bilaterally. Breast exam benign.   LABORATORY DATA:  I have reviewed the data as listed CBC Latest Ref Rng & Units 03/17/2019 03/19/2018 09/17/2017  WBC 4.0 - 10.5 K/uL 10.5 10.3 8.8  Hemoglobin 12.0 - 15.0 g/dL 14.1 14.9 14.3  Hematocrit 36.0 - 46.0 % 43.3 46.2(H) 43.8  Platelets 150 - 400 K/uL 230 213 229     CMP Latest Ref Rng & Units 03/17/2019 03/19/2018 09/17/2017  Glucose 70 - 99 mg/dL 235(H) 187(H) 235(H)  BUN 8 - 23 mg/dL '8 9 13  '$ Creatinine 0.44 - 1.00 mg/dL 0.89 0.96 1.17(H)  Sodium 135 - 145 mmol/L 145 141 141  Potassium 3.5 - 5.1 mmol/L 3.2(L) 4.0 3.9  Chloride 98 - 111 mmol/L 103 102 102  CO2 22 - 32 mmol/L '28 28 29  '$ Calcium 8.9 - 10.3 mg/dL 9.8 10.3 10.4  Total Protein 6.5 - 8.1 g/dL 6.8 6.8 7.0  Total Bilirubin 0.3 - 1.2 mg/dL 0.7 0.7 0.4  Alkaline Phos 38 - 126 U/L 56 59 63  AST 15 - 41 U/L '24 23 23  '$ ALT 0 - 44 U/L 28 32 29      RADIOGRAPHIC STUDIES: I have personally reviewed the radiological images as listed and agreed with the findings in the report. No results found.   ASSESSMENT & PLAN:  Elizabeth Hebert is a 73 y.o. female with   1. Clinical stage II invasive lobular carcinoma of left breast, s/p neoadjuvant endocrine therapy, pTI CN0, ER +70% PR negative HER-2/neu negative. RS 29 -The patient originally presented to the multidisciplinary breast clinic in September 2013 with new diagnosis of  left invasive lobular carcinoma by ultrasound measuring 1.6 and by MRI measuring 3 cm.  -Neoadjuvant antiestrogen therapy was recommended to try to reduce the size of the tumor for a good cosmetic result at that time. She went on to receive letrozole 2.5 mg daily  starting in 02/05/2012 through June 2014 -She underwent a partial left mastectomy with sentinel lymph node biopsy of the left breast. Final pathology did reveal residual disease measuring at T1cN0 ER positive PR negative HER-2/neu negative. Anterior margin was positive and it was reexcised.  -She completed radiation therapy to the left breast from Sept 2014 -October 2014 -She will continue letrozole once daily, for total 7 years, until September 2021. Tolerating well with manageable hot flashes.  -She is clinically doing well. Lab reviewed, her CBC and CMP are within normal limits except K 3.2 and BG 235. Her physical exam was unremarkable with mild LE edema. There is no clinical concern for recurrence. -For her mild hypokalemia, I will call in 10 day dose of oral potassium and encouraged her to increase potassium in diet.  -Continue surveillance. She is overdue for mammogram, I encouraged her to have it done in the next 1-2 months.  -f/u in 1 year    2. Osteoporosis -She was on prolia, switch to Boniva by her primary care physician Dr. Brigitte Pulse  -Repeat bone density scan every 2 years. Bone Density on 07/24/16 was T-score -2.8; osteoporotic. -We previously discussed letrozole may worse her osteoporosis.  -Continue Boniva, continue calcium and vitamin D and regular exercise. -Next DEXA in the next 1-2 months   3. DM, HTN, CAD and other medical issues  -She will continue to follow up with PCP Dr. Brigitte Pulse -Her PCP switched her DM medication for insulin this year.  -BP at 157/77. BG at 235 today (03/17/19). I encouraged her to continue to monitor and reduce sugar intake.   4. Hot Flashes  -secondary to Letrozole, stable and manageable -I  previously discussed managing this with Neurontin or SSRI. She is already on Zoloft, will continue.    Plan -Flu shot today  -I called in Oral KCL today for a 10 day dose  -She is clinically doing well, no signs of recurrence.  -Continue Letrozole, refilled today  -Lab and f/u in one year, plan to stop Letrozole after next visit  -Mammogram and DEXA in 1-2 months, she will call to schedule     No problem-specific Assessment & Plan notes found for this encounter.   No orders of the defined types were placed in this encounter.  All questions were answered. The patient knows to call the clinic with any problems, questions or concerns. No barriers to learning was detected. I spent 15 minutes counseling the patient face to face. The total time spent in the appointment was 20 minutes and more than 50% was on counseling and review of test results     Truitt Merle, MD 03/17/2019   I, Joslyn Devon, am acting as scribe for Truitt Merle, MD.   I have reviewed the above documentation for accuracy and completeness, and I agree with the above.

## 2019-03-17 ENCOUNTER — Inpatient Hospital Stay (HOSPITAL_BASED_OUTPATIENT_CLINIC_OR_DEPARTMENT_OTHER): Payer: Medicare Other | Admitting: Hematology

## 2019-03-17 ENCOUNTER — Other Ambulatory Visit: Payer: Self-pay

## 2019-03-17 ENCOUNTER — Inpatient Hospital Stay: Payer: Medicare Other | Attending: Hematology

## 2019-03-17 ENCOUNTER — Encounter: Payer: Self-pay | Admitting: Hematology

## 2019-03-17 VITALS — BP 157/77 | HR 99 | Temp 98.9°F | Resp 16 | Ht 61.0 in | Wt 171.5 lb

## 2019-03-17 DIAGNOSIS — C50411 Malignant neoplasm of upper-outer quadrant of right female breast: Secondary | ICD-10-CM

## 2019-03-17 DIAGNOSIS — Z23 Encounter for immunization: Secondary | ICD-10-CM

## 2019-03-17 DIAGNOSIS — C50412 Malignant neoplasm of upper-outer quadrant of left female breast: Secondary | ICD-10-CM | POA: Diagnosis not present

## 2019-03-17 DIAGNOSIS — M810 Age-related osteoporosis without current pathological fracture: Secondary | ICD-10-CM | POA: Diagnosis not present

## 2019-03-17 LAB — CBC WITH DIFFERENTIAL/PLATELET
Abs Immature Granulocytes: 0.04 10*3/uL (ref 0.00–0.07)
Basophils Absolute: 0.1 10*3/uL (ref 0.0–0.1)
Basophils Relative: 1 %
Eosinophils Absolute: 0.3 10*3/uL (ref 0.0–0.5)
Eosinophils Relative: 3 %
HCT: 43.3 % (ref 36.0–46.0)
Hemoglobin: 14.1 g/dL (ref 12.0–15.0)
Immature Granulocytes: 0 %
Lymphocytes Relative: 14 %
Lymphs Abs: 1.5 10*3/uL (ref 0.7–4.0)
MCH: 29.4 pg (ref 26.0–34.0)
MCHC: 32.6 g/dL (ref 30.0–36.0)
MCV: 90.2 fL (ref 80.0–100.0)
Monocytes Absolute: 0.9 10*3/uL (ref 0.1–1.0)
Monocytes Relative: 8 %
Neutro Abs: 7.6 10*3/uL (ref 1.7–7.7)
Neutrophils Relative %: 74 %
Platelets: 230 10*3/uL (ref 150–400)
RBC: 4.8 MIL/uL (ref 3.87–5.11)
RDW: 14.6 % (ref 11.5–15.5)
WBC: 10.5 10*3/uL (ref 4.0–10.5)
nRBC: 0 % (ref 0.0–0.2)

## 2019-03-17 LAB — COMPREHENSIVE METABOLIC PANEL
ALT: 28 U/L (ref 0–44)
AST: 24 U/L (ref 15–41)
Albumin: 3.8 g/dL (ref 3.5–5.0)
Alkaline Phosphatase: 56 U/L (ref 38–126)
Anion gap: 14 (ref 5–15)
BUN: 8 mg/dL (ref 8–23)
CO2: 28 mmol/L (ref 22–32)
Calcium: 9.8 mg/dL (ref 8.9–10.3)
Chloride: 103 mmol/L (ref 98–111)
Creatinine, Ser: 0.89 mg/dL (ref 0.44–1.00)
GFR calc Af Amer: >60 mL/min (ref >60)
GFR calc non Af Amer: >60 mL/min (ref >60)
Glucose, Bld: 235 mg/dL — ABNORMAL HIGH (ref 70–99)
Potassium: 3.2 mmol/L — ABNORMAL LOW (ref 3.5–5.1)
Sodium: 145 mmol/L (ref 135–145)
Total Bilirubin: 0.7 mg/dL (ref 0.3–1.2)
Total Protein: 6.8 g/dL (ref 6.5–8.1)

## 2019-03-17 MED ORDER — INFLUENZA VAC A&B SA ADJ QUAD 0.5 ML IM PRSY
0.5000 mL | PREFILLED_SYRINGE | Freq: Once | INTRAMUSCULAR | Status: AC
  Administered 2019-03-17: 12:00:00 0.5 mL via INTRAMUSCULAR

## 2019-03-17 MED ORDER — INFLUENZA VAC A&B SA ADJ QUAD 0.5 ML IM PRSY
PREFILLED_SYRINGE | INTRAMUSCULAR | Status: AC
  Filled 2019-03-17: qty 0.5

## 2019-03-17 MED ORDER — POTASSIUM CHLORIDE ER 10 MEQ PO TBCR: 10.0000 meq | EXTENDED_RELEASE_TABLET | Freq: Every day | ORAL | 0 refills | Status: DC

## 2019-03-17 MED ORDER — LETROZOLE 2.5 MG PO TABS: ORAL_TABLET | ORAL | 3 refills | Status: DC

## 2019-03-18 ENCOUNTER — Telehealth: Payer: Self-pay | Admitting: Hematology

## 2019-03-18 NOTE — Telephone Encounter (Signed)
Scheduled appt per 10/14 los. ° °Spoke with pt daughter and she is aware of the appt date and time °

## 2019-03-24 DIAGNOSIS — Z961 Presence of intraocular lens: Secondary | ICD-10-CM | POA: Diagnosis not present

## 2019-03-24 DIAGNOSIS — E7800 Pure hypercholesterolemia, unspecified: Secondary | ICD-10-CM | POA: Diagnosis not present

## 2019-03-24 DIAGNOSIS — E11319 Type 2 diabetes mellitus with unspecified diabetic retinopathy without macular edema: Secondary | ICD-10-CM | POA: Diagnosis not present

## 2019-03-24 DIAGNOSIS — Z794 Long term (current) use of insulin: Secondary | ICD-10-CM | POA: Diagnosis not present

## 2019-03-24 DIAGNOSIS — E113293 Type 2 diabetes mellitus with mild nonproliferative diabetic retinopathy without macular edema, bilateral: Secondary | ICD-10-CM | POA: Diagnosis not present

## 2019-03-24 DIAGNOSIS — I119 Hypertensive heart disease without heart failure: Secondary | ICD-10-CM | POA: Diagnosis not present

## 2019-03-24 DIAGNOSIS — D509 Iron deficiency anemia, unspecified: Secondary | ICD-10-CM | POA: Diagnosis not present

## 2019-03-29 DIAGNOSIS — C50919 Malignant neoplasm of unspecified site of unspecified female breast: Secondary | ICD-10-CM | POA: Diagnosis not present

## 2019-03-29 DIAGNOSIS — N182 Chronic kidney disease, stage 2 (mild): Secondary | ICD-10-CM | POA: Diagnosis not present

## 2019-03-29 DIAGNOSIS — Z7189 Other specified counseling: Secondary | ICD-10-CM | POA: Diagnosis not present

## 2019-03-29 DIAGNOSIS — Z7984 Long term (current) use of oral hypoglycemic drugs: Secondary | ICD-10-CM | POA: Diagnosis not present

## 2019-03-29 DIAGNOSIS — E7800 Pure hypercholesterolemia, unspecified: Secondary | ICD-10-CM | POA: Diagnosis not present

## 2019-03-29 DIAGNOSIS — E11319 Type 2 diabetes mellitus with unspecified diabetic retinopathy without macular edema: Secondary | ICD-10-CM | POA: Diagnosis not present

## 2019-03-29 DIAGNOSIS — E669 Obesity, unspecified: Secondary | ICD-10-CM | POA: Diagnosis not present

## 2019-03-29 DIAGNOSIS — I119 Hypertensive heart disease without heart failure: Secondary | ICD-10-CM | POA: Diagnosis not present

## 2019-03-29 DIAGNOSIS — D509 Iron deficiency anemia, unspecified: Secondary | ICD-10-CM | POA: Diagnosis not present

## 2019-03-29 DIAGNOSIS — I2510 Atherosclerotic heart disease of native coronary artery without angina pectoris: Secondary | ICD-10-CM | POA: Diagnosis not present

## 2019-03-29 DIAGNOSIS — Z6832 Body mass index (BMI) 32.0-32.9, adult: Secondary | ICD-10-CM | POA: Diagnosis not present

## 2019-04-14 ENCOUNTER — Other Ambulatory Visit: Payer: Self-pay | Admitting: Hematology and Oncology

## 2019-04-14 ENCOUNTER — Other Ambulatory Visit: Payer: Self-pay | Admitting: Obstetrics and Gynecology

## 2019-04-14 DIAGNOSIS — Z1231 Encounter for screening mammogram for malignant neoplasm of breast: Secondary | ICD-10-CM

## 2019-04-19 ENCOUNTER — Other Ambulatory Visit: Payer: Self-pay

## 2019-04-19 ENCOUNTER — Ambulatory Visit
Admission: RE | Admit: 2019-04-19 | Discharge: 2019-04-19 | Disposition: A | Discharge status: Home / Self Care | Payer: Medicare Other | Source: Ambulatory Visit | Attending: Hematology and Oncology | Admitting: Hematology and Oncology

## 2019-04-19 DIAGNOSIS — Z1231 Encounter for screening mammogram for malignant neoplasm of breast: Secondary | ICD-10-CM

## 2019-09-22 DIAGNOSIS — F322 Major depressive disorder, single episode, severe without psychotic features: Secondary | ICD-10-CM | POA: Diagnosis not present

## 2019-09-22 DIAGNOSIS — E11319 Type 2 diabetes mellitus with unspecified diabetic retinopathy without macular edema: Secondary | ICD-10-CM | POA: Diagnosis not present

## 2019-09-22 DIAGNOSIS — E7800 Pure hypercholesterolemia, unspecified: Secondary | ICD-10-CM | POA: Diagnosis not present

## 2019-09-22 DIAGNOSIS — M810 Age-related osteoporosis without current pathological fracture: Secondary | ICD-10-CM | POA: Diagnosis not present

## 2019-09-22 DIAGNOSIS — K5090 Crohn's disease, unspecified, without complications: Secondary | ICD-10-CM | POA: Diagnosis not present

## 2019-09-22 DIAGNOSIS — I119 Hypertensive heart disease without heart failure: Secondary | ICD-10-CM | POA: Diagnosis not present

## 2019-09-22 DIAGNOSIS — I700 Atherosclerosis of aorta: Secondary | ICD-10-CM | POA: Diagnosis not present

## 2019-09-22 DIAGNOSIS — E669 Obesity, unspecified: Secondary | ICD-10-CM | POA: Diagnosis not present

## 2019-09-22 DIAGNOSIS — C50919 Malignant neoplasm of unspecified site of unspecified female breast: Secondary | ICD-10-CM | POA: Diagnosis not present

## 2019-09-22 DIAGNOSIS — I2510 Atherosclerotic heart disease of native coronary artery without angina pectoris: Secondary | ICD-10-CM | POA: Diagnosis not present

## 2019-09-22 DIAGNOSIS — D509 Iron deficiency anemia, unspecified: Secondary | ICD-10-CM | POA: Diagnosis not present

## 2019-09-22 DIAGNOSIS — Z0000 Encounter for general adult medical examination without abnormal findings: Secondary | ICD-10-CM | POA: Diagnosis not present

## 2019-09-27 ENCOUNTER — Other Ambulatory Visit: Payer: Self-pay | Admitting: Family Medicine

## 2019-09-27 DIAGNOSIS — M810 Age-related osteoporosis without current pathological fracture: Secondary | ICD-10-CM

## 2020-01-25 DIAGNOSIS — N182 Chronic kidney disease, stage 2 (mild): Secondary | ICD-10-CM | POA: Diagnosis not present

## 2020-01-25 DIAGNOSIS — F322 Major depressive disorder, single episode, severe without psychotic features: Secondary | ICD-10-CM | POA: Diagnosis not present

## 2020-01-25 DIAGNOSIS — E7800 Pure hypercholesterolemia, unspecified: Secondary | ICD-10-CM | POA: Diagnosis not present

## 2020-01-25 DIAGNOSIS — I119 Hypertensive heart disease without heart failure: Secondary | ICD-10-CM | POA: Diagnosis not present

## 2020-01-25 DIAGNOSIS — E11319 Type 2 diabetes mellitus with unspecified diabetic retinopathy without macular edema: Secondary | ICD-10-CM | POA: Diagnosis not present

## 2020-01-25 DIAGNOSIS — F4321 Adjustment disorder with depressed mood: Secondary | ICD-10-CM | POA: Diagnosis not present

## 2020-01-25 DIAGNOSIS — C50919 Malignant neoplasm of unspecified site of unspecified female breast: Secondary | ICD-10-CM | POA: Diagnosis not present

## 2020-01-25 DIAGNOSIS — I2510 Atherosclerotic heart disease of native coronary artery without angina pectoris: Secondary | ICD-10-CM | POA: Diagnosis not present

## 2020-03-03 ENCOUNTER — Other Ambulatory Visit: Payer: Self-pay | Admitting: Hematology

## 2020-03-03 DIAGNOSIS — Z23 Encounter for immunization: Secondary | ICD-10-CM | POA: Diagnosis not present

## 2020-03-03 DIAGNOSIS — C50411 Malignant neoplasm of upper-outer quadrant of right female breast: Secondary | ICD-10-CM

## 2020-03-15 NOTE — Progress Notes (Signed)
[place]   Telephone:(336) [phone] Fax:(336) [phone]   Clinic Follow up Note   Patient Care Team: Mayra Neer, MD as PCP - General (Family Medicine) April 09, 2020  CHIEF COMPLAINT: F/u breast cancer   SUMMARY OF ONCOLOGIC HISTORY: Oncology History  Cancer of upper-outer quadrant of female breast ([place])  01/24/2012 Initial Biopsy   Diagnosis 01/24/12 Breast, left, needle core biopsy, mass, 1 o'clock - POSITIVE FOR INVASIVE MAMMARY CARCINOMA WITH LOBULAR FEATURES. - SEE COMMENT. Results IMMUNOHISTOCHEMICAL AND MORPHOMETRIC ANALYSIS BY THE AUTOMATED CELLULAR IMAGING SYSTEM (ACIS) This invasive carcinoma shows the following breast prognostic profile. Estrogen Receptor (Negative, <1%): 100%,POSITIVE, STRONG STAINING INTENSITY Progesterone Receptor (Negative, <1%): 2%, POSITIVE,STRONG STAINING INTENSITY HER2 negative    01/30/2012 Initial Diagnosis   Cancer of upper-outer quadrant of female breast ([place])   02/05/2012 - 11/2012 Neo-Adjuvant Anti-estrogen oral therapy   We recommended neoadjuvant antiestrogen therapy to try to reduce the size of the tumor for a good cosmetic result at this time of lumpectomy. She went on to receive letrozole 2.5 mg daily starting in 02/05/2012 through June 2014    12/08/2012 Pathology Results   Diagnosis 12/08/12 1. Lymph node, sentinel, biopsy, left axilla, node #1 - THERE IS NO EVIDENCE OF CARCINOMA IN 1 OF 1 LYMPH NODE (0/1). - SEE COMMENT. 2. Breast, lumpectomy, left - INVASIVE LOBULAR CARCINOMA, GRADE II/III, SPANNING 1.6 CM. - LOBULAR CARCINOMA IN SITU. - INVASIVE CARCINOMA IS BROADLY LESS THAN 0.1 CM TO THE ANTERIOR MARGIN - SEE ONCOLOGY TABLE BELOW. 3. Lymph node, sentinel, biopsy, left axilla, node #2 - THERE IS NO EVIDENCE OF CARCINOMA IN 1 OF 1 LYMPH NODE (0/1) 4. Lymph node, sentinel, biopsy, left axilla, node #3 - THERE IS NO EVIDENCE OF CARCINOMA IN 1 OF 1 LYMPH NODE (0/1). 5. Lymph node, sentinel, biopsy, left axilla,  node #4 - THERE IS NO EVIDENCE OF CARCINOMA IN 1 OF 1 LYMPH NODE (0/1). Estrogen Receptor: 78%, POSITIVE, STRONG STAINING INTENSITY Progesterone Receptor: 0%, NEGATIVE HER-2/NEU BY CISH - NO AMPLIFICATION OF HER-2 DETECTED.   12/25/2012 Pathology Results   RE-excision surgery 12/25/12 Diagnosis Breast, excision, Left new anterior margin - BENIGN BREAST PARENCHYMA WITH FAT NECROSIS, CONSISTENT WITH PRIOR SURGICAL PROCEDURE. - THERE IS NO EVIDENCE OF MALIGNANCY. - SEE COMMENT.    Oncotype testing   Oncotype score 29    - 02/18/2013 Radiation Therapy   patient completed radiation therapy to the left breast on 02/18/2013.   02/18/2013 -  Anti-estrogen oral therapy   began letrozole 2.5 mg started 02/18/2013 for a total of 7 years     CURRENT THERAPY: Letrozole 2.5 mg daily 02/2013, goal 7 years   INTERVAL HISTORY: Elizabeth Hebert returns for annual f/u as scheduled. She was last seen by Dr. Burr Medico 04/10/19.  Her husband died unexpectedly in 01-25-2020 after a short battle with pancreatic cancer.  She is grieving, sertraline is helping, she has family support.  She continues letrozole, with moderate hot flashes.  Denies bone or joint pain.  Denies any concerns in her breast such as new lump/mass/nipple discharge or inversion.  She is having a Crohn's flare from her stress.  Denies any bleeding, pain, recent fever, chills, cough, chest pain, dyspnea or other new concerns.      MEDICAL HISTORY:  Past Medical History:  Diagnosis Date  . Anemia   . Anginal pain ([place])    infreq  . Arthritis   . Breast cancer ([place]) 12/08/2012   left lumpectomy  . Broken arm 2011  .  Cancer ([place])    breaast  . Colitis   . Depression   . Diabetes mellitus without complication ([place])   . Diverticulitis   . Fibroid tumor   . GERD (gastroesophageal reflux disease)   . H/O hiatal hernia   . Heart murmur   . History of cataract surgery 2012  . Hx of hernia repair   . Hx of radiation therapy 01/26/13- 02/18/13    left breast 4250 cGy 17 sessions  . Hypertension   . Myocardial infarction ([place])    stents x 2 when had mi 99  . Personal history of radiation therapy 12/08/2012    SURGICAL HISTORY: Past Surgical History:  Procedure Laterality Date  . APPENDECTOMY    . BREAST LUMPECTOMY Left 12/08/2012   left lumpectomy  . CHOLECYSTECTOMY    . COLON RESECTION    . EYE SURGERY Bilateral    cat   . HERNIA REPAIR     rt  . MASTECTOMY, PARTIAL Left 12/25/2012   Procedure: MASTECTOMY PARTIAL with re-excision of margins;  Surgeon: Adin Hector, MD;  Location: [facility];  Service: General;  Laterality: Left;  . PARTIAL MASTECTOMY WITH NEEDLE LOCALIZATION AND AXILLARY SENTINEL LYMPH NODE BX Left 12/08/2012   Procedure: LEFT PARTIAL MASTECTOMY WITH NEEDLE LOCALIZATION AND AXILLARY SENTINEL LYMPH NODE BIOPSIES TIMES FOUR;  Surgeon: Adin Hector, MD;  Location: [facility];  Service: General;  Laterality: Left;  . TOTAL ABDOMINAL HYSTERECTOMY  2001    I have reviewed the social history and family history with the patient and they are unchanged from previous note.  ALLERGIES:  is allergic to vasotec [enalaprilat].  MEDICATIONS:  Current Outpatient Medications  Medication Sig Dispense Refill  . aspirin 81 MG chewable tablet Chew 81 mg by mouth daily.    Marland Kitchen CALCIUM-MAGNESIUM-VITAMIN D PO Take 370 mg by mouth daily.    . cimetidine (TAGAMET) 200 MG tablet Take 200 mg by mouth 2 (two) times daily.    . digoxin (LANOXIN) 0.125 MG tablet Take 0.125 mg by mouth daily.    Marland Kitchen ezetimibe (ZETIA) 10 MG tablet Take 10 mg by mouth daily.    . ferrous sulfate 325 (65 FE) MG tablet Take 325 mg by mouth daily with breakfast.    . ibandronate (BONIVA) 150 MG tablet Take 150 mg by mouth every 30 (thirty) days.   6  . metFORMIN (GLUCOPHAGE) 1000 MG tablet Take 1,000 mg by mouth 2 (two) times daily with a meal.    . metoprolol succinate (TOPROL-XL) 50 MG 24 hr tablet Take 50 mg by mouth daily. Take with or  immediately following a meal.    . potassium chloride (KLOR-CON) 10 MEQ tablet Take 1 tablet (10 mEq total) by mouth daily. 10 tablet 0  . rosuvastatin (CRESTOR) 20 MG tablet Take 20 mg by mouth daily.    . sertraline (ZOLOFT) 50 MG tablet Take 50 mg by mouth daily.    . valsartan-hydrochlorothiazide (DIOVAN-HCT) 320-25 MG per tablet Take 1 tablet by mouth daily.  5  . Empagliflozin-Linagliptin (GLYXAMBI) 25-5 MG TABS Take by mouth daily.     No current facility-administered medications for this visit.    PHYSICAL EXAMINATION: ECOG PERFORMANCE STATUS: 1 - Symptomatic but completely ambulatory  Vitals:   03/16/20 1214  BP: (!) 151/98  Pulse: 80  Resp: 17  Temp: (!) 97.5 F (36.4 C)  SpO2: 96%   Filed Weights   03/16/20 1214  Weight: 172 lb 3.2 oz (78.1 kg)  GENERAL:alert, no distress and comfortable SKIN: No rash to exposed skin EYES: sclera clear NECK: Without mass LYMPH:  no palpable cervical or supraclavicular lymphadenopathy  LUNGS:  normal breathing effort HEART: Trace lower extremity edema NEURO: alert & oriented x 3 with fluent speech Breast exam: S/p left lumpectomy.  Bilateral nipple inversion without discharge.  No palpable mass in either breast or axilla that I could appreciate  LABORATORY DATA:  I have reviewed the data as listed CBC Latest Ref Rng & Units 03/16/2020 03/17/2019 03/19/2018  WBC 4.0 - 10.5 K/uL 8.8 10.5 10.3  Hemoglobin 12.0 - 15.0 g/dL 13.3 14.1 14.9  Hematocrit 36 - 46 % 42.2 43.3 46.2(H)  Platelets 150 - 400 K/uL 237 230 213     CMP Latest Ref Rng & Units 03/16/2020 03/17/2019 03/19/2018  Glucose 70 - 99 mg/dL 166(H) 235(H) 187(H)  BUN 8 - 23 mg/dL 7(L) 8 9  Creatinine 0.44 - 1.00 mg/dL 0.89 0.89 0.96  Sodium 135 - 145 mmol/L 140 145 141  Potassium 3.5 - 5.1 mmol/L 4.1 3.2(L) 4.0  Chloride 98 - 111 mmol/L 102 103 102  CO2 22 - 32 mmol/L 35(H) 28 28  Calcium 8.9 - 10.3 mg/dL 10.1 9.8 10.3  Total Protein 6.5 - 8.1 g/dL 6.8 6.8 6.8    Total Bilirubin 0.3 - 1.2 mg/dL 0.5 0.7 0.7  Alkaline Phos 38 - 126 U/L 56 56 59  AST 15 - 41 U/L _0 ALT 0 - 44 U/L 22 28 32      RADIOGRAPHIC STUDIES: I have personally reviewed the radiological images as listed and agreed with the findings in the report. No results found.   ASSESSMENT & PLAN: Elizabeth Hebert is a 74 y.o. female with   1. Clinical stage II invasive lobular carcinoma of left breast, s/p neoadjuvant endocrine therapy, pTI CN0, ER +70% PR negative HER-2/neu negative. RS 29 -Diagnosed 02/2012.  S/p neoadjuvant antiestrogen therapy letrozole 02/2012-11/2012  -partial left mastectomy with sentinel lymph node biopsy of the left breast. Final pathology did reveal residual disease measuring at T1cN0 ER positive PR negative HER-2/neu negative. Anterior margin was positive and it was reexcised.  -S/p adjuvant radiation therapy to the left breast from 02/2013 -03/2013 -She continued letrozole for total of 7 years, completing today 03/16/2020  -Continue annual screening mammograms and surveillance  2. Osteoporosis -She was on prolia, switch to Boniva by her primary care physician Dr. Brigitte Pulse  -Repeat bone density scan every 2 years. Bone Density on 07/24/16 was T-score -2.8; osteoporotic. -Continues Boniva, calcium, vitamin D - repeat DEXA due with mammogram 04/2020  3. DM, HTN, CAD and other medical issues -Follow-up with PCP  4. Hot Flashes  -secondary to Letrozole, stable and manageable -On Zoloft -Completed letrozole 03/2020  Disposition Elizabeth Hebert is clinically doing well.  She has now completed 7 years of adjuvant aromatase inhibitor with letrozole which she will discontinue today.  She tolerated well with mild-moderate hot flashes.  Breast exam is benign.  CBC and CMP are unremarkable.  There is no clinical concern for recurrence.  She will continue surveillance.  She is due for mammogram and DEXA in 04/2020.  The orders have been placed.  She prefers to  follow-up in our clinic.  I encouraged her to remain active with healthy diet, hydration, abstaining from smoking and limiting alcohol.    I offered her a referral to social work for grief counseling for her husband's recent death which she declined today.  She  will return in 1 year for annual surveillance visit, or sooner if needed.   Orders Placed This Encounter  Procedures  . MM 3D SCREEN BREAST BILATERAL    Standing Status:   Future    Standing Expiration Date:   03/16/2021    Order Specific Question:   Reason for Exam (SYMPTOM  OR DIAGNOSIS REQUIRED)    Answer:   History of left breast cancer 2013    Order Specific Question:   Preferred imaging location?    Answer:   [facility]  . DG Bone Density    Standing Status:   Future    Standing Expiration Date:   03/16/2021    Order Specific Question:   Reason for Exam (SYMPTOM  OR DIAGNOSIS REQUIRED)    Answer:   History of osteoporosis    Order Specific Question:   Preferred imaging location?    Answer:   [facility]   All questions were answered. The patient knows to call the clinic with any problems, questions or concerns. No barriers to learning were detected.     Alla Feeling, NP 03/16/20

## 2020-03-16 ENCOUNTER — Encounter: Payer: Self-pay | Admitting: Nurse Practitioner

## 2020-03-16 ENCOUNTER — Inpatient Hospital Stay (HOSPITAL_BASED_OUTPATIENT_CLINIC_OR_DEPARTMENT_OTHER): Payer: Medicare Other | Admitting: Nurse Practitioner

## 2020-03-16 ENCOUNTER — Inpatient Hospital Stay: Payer: Medicare Other | Attending: Nurse Practitioner

## 2020-03-16 ENCOUNTER — Other Ambulatory Visit: Payer: Self-pay

## 2020-03-16 VITALS — BP 151/98 | HR 80 | Temp 97.5°F | Resp 17 | Ht 61.0 in | Wt 172.2 lb

## 2020-03-16 DIAGNOSIS — E119 Type 2 diabetes mellitus without complications: Secondary | ICD-10-CM | POA: Diagnosis not present

## 2020-03-16 DIAGNOSIS — Z923 Personal history of irradiation: Secondary | ICD-10-CM | POA: Insufficient documentation

## 2020-03-16 DIAGNOSIS — M810 Age-related osteoporosis without current pathological fracture: Secondary | ICD-10-CM

## 2020-03-16 DIAGNOSIS — Z853 Personal history of malignant neoplasm of breast: Secondary | ICD-10-CM | POA: Diagnosis not present

## 2020-03-16 DIAGNOSIS — Z1231 Encounter for screening mammogram for malignant neoplasm of breast: Secondary | ICD-10-CM | POA: Diagnosis not present

## 2020-03-16 DIAGNOSIS — N951 Menopausal and female climacteric states: Secondary | ICD-10-CM | POA: Insufficient documentation

## 2020-03-16 DIAGNOSIS — I10 Essential (primary) hypertension: Secondary | ICD-10-CM | POA: Insufficient documentation

## 2020-03-16 DIAGNOSIS — Z79899 Other long term (current) drug therapy: Secondary | ICD-10-CM | POA: Diagnosis not present

## 2020-03-16 DIAGNOSIS — C50412 Malignant neoplasm of upper-outer quadrant of left female breast: Secondary | ICD-10-CM

## 2020-03-16 DIAGNOSIS — I2510 Atherosclerotic heart disease of native coronary artery without angina pectoris: Secondary | ICD-10-CM | POA: Diagnosis not present

## 2020-03-16 DIAGNOSIS — I252 Old myocardial infarction: Secondary | ICD-10-CM | POA: Insufficient documentation

## 2020-03-16 DIAGNOSIS — Z9012 Acquired absence of left breast and nipple: Secondary | ICD-10-CM | POA: Diagnosis not present

## 2020-03-16 LAB — CBC WITH DIFFERENTIAL/PLATELET
Abs Immature Granulocytes: 0.07 10*3/uL (ref 0.00–0.07)
Basophils Absolute: 0.1 10*3/uL (ref 0.0–0.1)
Basophils Relative: 1 %
Eosinophils Absolute: 0.6 10*3/uL — ABNORMAL HIGH (ref 0.0–0.5)
Eosinophils Relative: 7 %
HCT: 42.2 % (ref 36.0–46.0)
Hemoglobin: 13.3 g/dL (ref 12.0–15.0)
Immature Granulocytes: 1 %
Lymphocytes Relative: 22 %
Lymphs Abs: 1.9 10*3/uL (ref 0.7–4.0)
MCH: 28.9 pg (ref 26.0–34.0)
MCHC: 31.5 g/dL (ref 30.0–36.0)
MCV: 91.7 fL (ref 80.0–100.0)
Monocytes Absolute: 0.8 10*3/uL (ref 0.1–1.0)
Monocytes Relative: 9 %
Neutro Abs: 5.3 10*3/uL (ref 1.7–7.7)
Neutrophils Relative %: 60 %
Platelets: 237 10*3/uL (ref 150–400)
RBC: 4.6 MIL/uL (ref 3.87–5.11)
RDW: 15.3 % (ref 11.5–15.5)
WBC: 8.8 10*3/uL (ref 4.0–10.5)
nRBC: 0 % (ref 0.0–0.2)

## 2020-03-16 LAB — COMPREHENSIVE METABOLIC PANEL
ALT: 22 U/L (ref 0–44)
AST: 24 U/L (ref 15–41)
Albumin: 3.6 g/dL (ref 3.5–5.0)
Alkaline Phosphatase: 56 U/L (ref 38–126)
Anion gap: 3 — ABNORMAL LOW (ref 5–15)
BUN: 7 mg/dL — ABNORMAL LOW (ref 8–23)
CO2: 35 mmol/L — ABNORMAL HIGH (ref 22–32)
Calcium: 10.1 mg/dL (ref 8.9–10.3)
Chloride: 102 mmol/L (ref 98–111)
Creatinine, Ser: 0.89 mg/dL (ref 0.44–1.00)
GFR, Estimated: >60 mL/min (ref >60)
Glucose, Bld: 166 mg/dL — ABNORMAL HIGH (ref 70–99)
Potassium: 4.1 mmol/L (ref 3.5–5.1)
Sodium: 140 mmol/L (ref 135–145)
Total Bilirubin: 0.5 mg/dL (ref 0.3–1.2)
Total Protein: 6.8 g/dL (ref 6.5–8.1)

## 2020-03-17 ENCOUNTER — Telehealth: Payer: Self-pay | Admitting: Nurse Practitioner

## 2020-03-17 NOTE — Telephone Encounter (Signed)
Scheduled per 10/14 los. maling pt appt calendar.

## 2020-04-04 DIAGNOSIS — E113293 Type 2 diabetes mellitus with mild nonproliferative diabetic retinopathy without macular edema, bilateral: Secondary | ICD-10-CM | POA: Diagnosis not present

## 2020-04-04 DIAGNOSIS — H353131 Nonexudative age-related macular degeneration, bilateral, early dry stage: Secondary | ICD-10-CM | POA: Diagnosis not present

## 2020-04-04 DIAGNOSIS — H26492 Other secondary cataract, left eye: Secondary | ICD-10-CM | POA: Diagnosis not present

## 2020-04-04 DIAGNOSIS — H52203 Unspecified astigmatism, bilateral: Secondary | ICD-10-CM | POA: Diagnosis not present

## 2020-05-01 DIAGNOSIS — E11319 Type 2 diabetes mellitus with unspecified diabetic retinopathy without macular edema: Secondary | ICD-10-CM | POA: Diagnosis not present

## 2020-05-01 DIAGNOSIS — E7800 Pure hypercholesterolemia, unspecified: Secondary | ICD-10-CM | POA: Diagnosis not present

## 2020-05-01 DIAGNOSIS — F322 Major depressive disorder, single episode, severe without psychotic features: Secondary | ICD-10-CM | POA: Diagnosis not present

## 2020-05-01 DIAGNOSIS — I119 Hypertensive heart disease without heart failure: Secondary | ICD-10-CM | POA: Diagnosis not present

## 2020-05-01 DIAGNOSIS — N182 Chronic kidney disease, stage 2 (mild): Secondary | ICD-10-CM | POA: Diagnosis not present

## 2020-06-23 ENCOUNTER — Ambulatory Visit: Payer: 59

## 2020-06-23 ENCOUNTER — Other Ambulatory Visit: Payer: 59

## 2020-06-26 ENCOUNTER — Other Ambulatory Visit: Payer: Self-pay

## 2020-06-26 ENCOUNTER — Ambulatory Visit
Admission: RE | Admit: 2020-06-26 | Discharge: 2020-06-26 | Disposition: A | Discharge status: Home / Self Care | Payer: Medicare Other | Source: Ambulatory Visit | Attending: Nurse Practitioner | Admitting: Nurse Practitioner

## 2020-06-26 DIAGNOSIS — Z1231 Encounter for screening mammogram for malignant neoplasm of breast: Secondary | ICD-10-CM

## 2020-06-27 ENCOUNTER — Other Ambulatory Visit: Payer: Self-pay | Admitting: Nurse Practitioner

## 2020-06-27 DIAGNOSIS — R928 Other abnormal and inconclusive findings on diagnostic imaging of breast: Secondary | ICD-10-CM

## 2020-07-10 ENCOUNTER — Ambulatory Visit
Admission: RE | Admit: 2020-07-10 | Discharge: 2020-07-10 | Disposition: A | Discharge status: Home / Self Care | Payer: Medicare Other | Source: Ambulatory Visit | Attending: Nurse Practitioner | Admitting: Nurse Practitioner

## 2020-07-10 ENCOUNTER — Other Ambulatory Visit: Payer: Self-pay

## 2020-07-10 ENCOUNTER — Ambulatory Visit: Payer: Medicare Other

## 2020-07-10 DIAGNOSIS — R928 Other abnormal and inconclusive findings on diagnostic imaging of breast: Secondary | ICD-10-CM

## 2020-09-29 ENCOUNTER — Ambulatory Visit
Admission: RE | Admit: 2020-09-29 | Discharge: 2020-09-29 | Disposition: A | Discharge status: Home / Self Care | Payer: Medicare Other | Source: Ambulatory Visit | Attending: Family Medicine | Admitting: Family Medicine

## 2020-09-29 ENCOUNTER — Other Ambulatory Visit: Payer: Self-pay | Admitting: Family Medicine

## 2020-09-29 DIAGNOSIS — R059 Cough, unspecified: Secondary | ICD-10-CM

## 2020-10-25 ENCOUNTER — Ambulatory Visit
Admission: RE | Admit: 2020-10-25 | Discharge: 2020-10-25 | Disposition: A | Discharge status: Home / Self Care | Payer: 59 | Source: Ambulatory Visit | Attending: Nurse Practitioner | Admitting: Nurse Practitioner

## 2020-10-25 ENCOUNTER — Other Ambulatory Visit: Payer: Self-pay

## 2020-10-25 DIAGNOSIS — M810 Age-related osteoporosis without current pathological fracture: Secondary | ICD-10-CM

## 2020-10-26 ENCOUNTER — Telehealth: Payer: Self-pay

## 2020-10-26 NOTE — Telephone Encounter (Signed)
This nurse left message for patient to return call to clinic  related to results of DEXA.

## 2020-10-27 ENCOUNTER — Telehealth: Payer: Self-pay

## 2020-10-27 NOTE — Telephone Encounter (Signed)
-----   Message from Alla Feeling, NP sent at 10/26/2020 10:03 AM EDT ----- Please review DEXA which shows she still has osteoporosis, slightly better in forearm but worse in femur. Is she still taking boniva? If not, she can discuss restarting that with her PCP who initially managed. Also continue calcium, vitamin D, and activity/walking. Thanks, Regan Rakers, NP

## 2020-10-27 NOTE — Telephone Encounter (Signed)
I contacted the Patient to go over her Bone Density results. She verbalized understanding and confirms that she is still taking Boniva and will continue to take her calcium, Vit D, and she will continue to stay active by walking.

## 2021-02-01 ENCOUNTER — Ambulatory Visit
Admission: RE | Admit: 2021-02-01 | Discharge: 2021-02-01 | Disposition: A | Discharge status: Home / Self Care | Payer: Medicare Other | Source: Ambulatory Visit | Attending: Family Medicine | Admitting: Family Medicine

## 2021-02-01 ENCOUNTER — Other Ambulatory Visit: Payer: Self-pay | Admitting: Family Medicine

## 2021-02-01 DIAGNOSIS — R059 Cough, unspecified: Secondary | ICD-10-CM

## 2021-03-16 ENCOUNTER — Encounter: Payer: Self-pay | Admitting: Hematology

## 2021-03-16 ENCOUNTER — Inpatient Hospital Stay: Payer: Medicare Other

## 2021-03-16 ENCOUNTER — Other Ambulatory Visit: Payer: Self-pay

## 2021-03-16 ENCOUNTER — Inpatient Hospital Stay: Payer: Medicare Other | Attending: Hematology | Admitting: Hematology

## 2021-03-16 VITALS — BP 136/64 | HR 76 | Temp 98.0°F | Resp 18 | Ht 61.0 in | Wt 172.3 lb

## 2021-03-16 DIAGNOSIS — Z170 Estrogen receptor positive status [ER+]: Secondary | ICD-10-CM | POA: Insufficient documentation

## 2021-03-16 DIAGNOSIS — M810 Age-related osteoporosis without current pathological fracture: Secondary | ICD-10-CM | POA: Insufficient documentation

## 2021-03-16 DIAGNOSIS — I10 Essential (primary) hypertension: Secondary | ICD-10-CM | POA: Insufficient documentation

## 2021-03-16 DIAGNOSIS — E119 Type 2 diabetes mellitus without complications: Secondary | ICD-10-CM | POA: Insufficient documentation

## 2021-03-16 DIAGNOSIS — I2510 Atherosclerotic heart disease of native coronary artery without angina pectoris: Secondary | ICD-10-CM | POA: Insufficient documentation

## 2021-03-16 DIAGNOSIS — C50411 Malignant neoplasm of upper-outer quadrant of right female breast: Secondary | ICD-10-CM

## 2021-03-16 DIAGNOSIS — N951 Menopausal and female climacteric states: Secondary | ICD-10-CM | POA: Insufficient documentation

## 2021-03-16 DIAGNOSIS — Z923 Personal history of irradiation: Secondary | ICD-10-CM | POA: Insufficient documentation

## 2021-03-16 DIAGNOSIS — I252 Old myocardial infarction: Secondary | ICD-10-CM | POA: Insufficient documentation

## 2021-03-16 DIAGNOSIS — Z1231 Encounter for screening mammogram for malignant neoplasm of breast: Secondary | ICD-10-CM

## 2021-03-16 DIAGNOSIS — Z79899 Other long term (current) drug therapy: Secondary | ICD-10-CM | POA: Diagnosis not present

## 2021-03-16 DIAGNOSIS — Z90710 Acquired absence of both cervix and uterus: Secondary | ICD-10-CM | POA: Insufficient documentation

## 2021-03-16 DIAGNOSIS — Z9012 Acquired absence of left breast and nipple: Secondary | ICD-10-CM | POA: Insufficient documentation

## 2021-03-16 DIAGNOSIS — C50412 Malignant neoplasm of upper-outer quadrant of left female breast: Secondary | ICD-10-CM

## 2021-03-16 LAB — CMP (CANCER CENTER ONLY)
ALT: 22 U/L (ref 0–44)
AST: 33 U/L (ref 15–41)
Albumin: 3.6 g/dL (ref 3.5–5.0)
Alkaline Phosphatase: 49 U/L (ref 38–126)
Anion gap: 12 (ref 5–15)
BUN: 13 mg/dL (ref 8–23)
CO2: 30 mmol/L (ref 22–32)
Calcium: 9.1 mg/dL (ref 8.9–10.3)
Chloride: 94 mmol/L — ABNORMAL LOW (ref 98–111)
Creatinine: 0.92 mg/dL (ref 0.44–1.00)
GFR, Estimated: >60 mL/min (ref >60)
Glucose, Bld: 124 mg/dL — ABNORMAL HIGH (ref 70–99)
Potassium: 3 mmol/L — ABNORMAL LOW (ref 3.5–5.1)
Sodium: 136 mmol/L (ref 135–145)
Total Bilirubin: 0.8 mg/dL (ref 0.3–1.2)
Total Protein: 6.6 g/dL (ref 6.5–8.1)

## 2021-03-16 LAB — CBC WITH DIFFERENTIAL/PLATELET
Abs Immature Granulocytes: 0.03 10*3/uL (ref 0.00–0.07)
Basophils Absolute: 0 10*3/uL (ref 0.0–0.1)
Basophils Relative: 1 %
Eosinophils Absolute: 0.4 10*3/uL (ref 0.0–0.5)
Eosinophils Relative: 6 %
HCT: 43.9 % (ref 36.0–46.0)
Hemoglobin: 14.5 g/dL (ref 12.0–15.0)
Immature Granulocytes: 0 %
Lymphocytes Relative: 17 %
Lymphs Abs: 1.2 10*3/uL (ref 0.7–4.0)
MCH: 30.1 pg (ref 26.0–34.0)
MCHC: 33 g/dL (ref 30.0–36.0)
MCV: 91.1 fL (ref 80.0–100.0)
Monocytes Absolute: 0.7 10*3/uL (ref 0.1–1.0)
Monocytes Relative: 9 %
Neutro Abs: 4.8 10*3/uL (ref 1.7–7.7)
Neutrophils Relative %: 67 %
Platelets: 249 10*3/uL (ref 150–400)
RBC: 4.82 MIL/uL (ref 3.87–5.11)
RDW: 14.7 % (ref 11.5–15.5)
WBC: 7.2 10*3/uL (ref 4.0–10.5)
nRBC: 0 % (ref 0.0–0.2)

## 2021-03-16 NOTE — Progress Notes (Signed)
[place]   Telephone:(336) [phone] Fax:(336) [phone]   Clinic Follow up Note   Patient Care Team: Mayra Neer, MD as PCP - General (Family Medicine)  Date of Service:  03/16/2021  CHIEF COMPLAINT: f/u of left breast cancer  CURRENT THERAPY:  Surveillance  ASSESSMENT & PLAN:  Elizabeth Hebert is a 75 y.o. female with   1. Clinical stage II invasive lobular carcinoma of left breast, s/p neoadjuvant endocrine therapy, pTI CN0, ER +70% PR negative HER-2/neu negative. RS 29 -Diagnosed 02/2012.  S/p neoadjuvant antiestrogen therapy letrozole 02/2012-11/2012  -left lumpectomy with sentinel lymph node biopsy of the left breast. Final pathology did reveal residual disease measuring at T1cN0 ER positive PR negative HER-2/neu negative. Anterior margin was positive and it was reexcised.  -S/p adjuvant radiation therapy to the left breast from 02/2013 -03/2013 -She received 7 years of letrozole, completed 03/16/2020  -most recent mammograms in 06/2020 were negative. -she is clinically doing well. Labs reviewed, CBC WNL, CMP pending. Physical exam was unremarkable, there is no concern for recurrence. She is now 9 years out from diagnosis. We will see her as needed from now on. -Continue annual screening mammograms and surveillance   2. Osteoporosis -She was on prolia, switch to Boniva by her primary care physician Dr. Brigitte Pulse  -Bone Density on 07/24/16 was T-score -2.8; osteoporotic. Repeat on 10/25/20 showed slight improvement to -2.7. -Continues Boniva, calcium, vitamin D   3. DM, HTN, CAD and other medical issues  -Follow-up with PCP   4. Hot Flashes  -stable and manageable, still present despite completing letrozole in 03/2020 -On Zoloft   PLAN: -mammogram 07/2021 -f/u PRN   No problem-specific Assessment & Plan notes found for this encounter.   SUMMARY OF ONCOLOGIC HISTORY: Oncology History  Cancer of upper-outer quadrant of female breast ([place])  01/24/2012  Initial Biopsy   Diagnosis 01/24/12 Breast, left, needle core biopsy, mass, 1 o'clock - POSITIVE FOR INVASIVE MAMMARY CARCINOMA WITH LOBULAR FEATURES. - SEE COMMENT. Results IMMUNOHISTOCHEMICAL AND MORPHOMETRIC ANALYSIS BY THE AUTOMATED CELLULAR IMAGING SYSTEM (ACIS) This invasive carcinoma shows the following breast prognostic profile. Estrogen Receptor (Negative, <1%): 100%,POSITIVE, STRONG STAINING INTENSITY Progesterone Receptor (Negative, <1%): 2%, POSITIVE,STRONG STAINING INTENSITY HER2 negative    01/30/2012 Initial Diagnosis   Cancer of upper-outer quadrant of female breast ([place])   02/05/2012 - 11/2012 Neo-Adjuvant Anti-estrogen oral therapy   We recommended neoadjuvant antiestrogen therapy to try to reduce the size of the tumor for a good cosmetic result at this time of lumpectomy. She went on to receive letrozole 2.5 mg daily starting in 02/05/2012 through June 2014    12/08/2012 Pathology Results   Diagnosis 12/08/12 1. Lymph node, sentinel, biopsy, left axilla, node #1 - THERE IS NO EVIDENCE OF CARCINOMA IN 1 OF 1 LYMPH NODE (0/1). - SEE COMMENT. 2. Breast, lumpectomy, left - INVASIVE LOBULAR CARCINOMA, GRADE II/III, SPANNING 1.6 CM. - LOBULAR CARCINOMA IN SITU. - INVASIVE CARCINOMA IS BROADLY LESS THAN 0.1 CM TO THE ANTERIOR MARGIN - SEE ONCOLOGY TABLE BELOW. 3. Lymph node, sentinel, biopsy, left axilla, node #2 - THERE IS NO EVIDENCE OF CARCINOMA IN 1 OF 1 LYMPH NODE (0/1) 4. Lymph node, sentinel, biopsy, left axilla, node #3 - THERE IS NO EVIDENCE OF CARCINOMA IN 1 OF 1 LYMPH NODE (0/1). 5. Lymph node, sentinel, biopsy, left axilla, node #4 - THERE IS NO EVIDENCE OF CARCINOMA IN 1 OF 1 LYMPH NODE (0/1). Estrogen Receptor: 78%, POSITIVE, STRONG STAINING INTENSITY Progesterone Receptor: 0%, NEGATIVE HER-2/NEU  BY CISH - NO AMPLIFICATION OF HER-2 DETECTED.   12/25/2012 Pathology Results   RE-excision surgery 12/25/12 Diagnosis Breast, excision, Left new anterior margin -  BENIGN BREAST PARENCHYMA WITH FAT NECROSIS, CONSISTENT WITH PRIOR SURGICAL PROCEDURE. - THERE IS NO EVIDENCE OF MALIGNANCY. - SEE COMMENT.    Oncotype testing   Oncotype score 29    - 02/18/2013 Radiation Therapy   patient completed radiation therapy to the left breast on 02/18/2013.   02/18/2013 -  Anti-estrogen oral therapy   began letrozole 2.5 mg started 02/18/2013 for a total of 7 years      INTERVAL HISTORY:  Elizabeth Hebert is here for a follow up of breast cancer. She was last seen by NP Lacie a year ago. She presents to the clinic alone. She reports she is still grieving from the passing of her husband in 12/2019. She notes her oldest grandson lives with her. She reports her energy level is low, but she notes she is still able to do everything for herself.   All other systems were reviewed with the patient and are negative.  MEDICAL HISTORY:  Past Medical History:  Diagnosis Date   Anemia    Anginal pain ([place])    infreq   Arthritis    Breast cancer ([place]) 12/08/2012   left lumpectomy   Broken arm 2011   Cancer ([place])    breaast   Colitis    Depression    Diabetes mellitus without complication ([place])    Diverticulitis    Fibroid tumor    GERD (gastroesophageal reflux disease)    H/O hiatal hernia    Heart murmur    History of cataract surgery 2012   Hx of hernia repair    Hx of radiation therapy 01/26/13- 02/18/13   left breast 4250 cGy 17 sessions   Hypertension    Myocardial infarction ([place])    stents x 2 when had mi 99   Personal history of radiation therapy 12/08/2012    SURGICAL HISTORY: Past Surgical History:  Procedure Laterality Date   APPENDECTOMY     BREAST LUMPECTOMY Left 12/08/2012   left lumpectomy   CHOLECYSTECTOMY     COLON RESECTION     EYE SURGERY Bilateral    cat    HERNIA REPAIR     rt   MASTECTOMY, PARTIAL Left 12/25/2012   Procedure: MASTECTOMY PARTIAL with re-excision of margins;  Surgeon: Adin Hector, MD;  Location:  [facility];  Service: General;  Laterality: Left;   PARTIAL MASTECTOMY WITH NEEDLE LOCALIZATION AND AXILLARY SENTINEL LYMPH NODE BX Left 12/08/2012   Procedure: LEFT PARTIAL MASTECTOMY WITH NEEDLE LOCALIZATION AND AXILLARY SENTINEL LYMPH NODE BIOPSIES TIMES FOUR;  Surgeon: Adin Hector, MD;  Location: [facility];  Service: General;  Laterality: Left;   TOTAL ABDOMINAL HYSTERECTOMY  2001    I have reviewed the social history and family history with the patient and they are unchanged from previous note.  ALLERGIES:  is allergic to vasotec [enalaprilat].  MEDICATIONS:  Current Outpatient Medications  Medication Sig Dispense Refill   aspirin 81 MG chewable tablet Chew 81 mg by mouth daily.     CALCIUM-MAGNESIUM-VITAMIN D PO Take 370 mg by mouth daily.     cimetidine (TAGAMET) 200 MG tablet Take 200 mg by mouth 2 (two) times daily.     digoxin (LANOXIN) 0.125 MG tablet Take 0.125 mg by mouth daily.     Empagliflozin-Linagliptin (GLYXAMBI) 25-5 MG TABS Take by mouth daily.  ezetimibe (ZETIA) 10 MG tablet Take 10 mg by mouth daily.     ferrous sulfate 325 (65 FE) MG tablet Take 325 mg by mouth daily with breakfast.     ibandronate (BONIVA) 150 MG tablet Take 150 mg by mouth every 30 (thirty) days.   6   metFORMIN (GLUCOPHAGE) 1000 MG tablet Take 1,000 mg by mouth 2 (two) times daily with a meal.     metoprolol succinate (TOPROL-XL) 50 MG 24 hr tablet Take 50 mg by mouth daily. Take with or immediately following a meal.     potassium chloride (KLOR-CON) 10 MEQ tablet Take 1 tablet (10 mEq total) by mouth daily. 10 tablet 0   rosuvastatin (CRESTOR) 20 MG tablet Take 20 mg by mouth daily.     sertraline (ZOLOFT) 50 MG tablet Take 50 mg by mouth daily.     valsartan-hydrochlorothiazide (DIOVAN-HCT) 320-25 MG per tablet Take 1 tablet by mouth daily.  5   No current facility-administered medications for this visit.    PHYSICAL EXAMINATION: ECOG PERFORMANCE STATUS: 0 -  Asymptomatic  There were no vitals filed for this visit. Wt Readings from Last 3 Encounters:  03/16/20 172 lb 3.2 oz (78.1 kg)  03/17/19 171 lb 8 oz (77.8 kg)  03/19/18 170 lb 14.4 oz (77.5 kg)     GENERAL:alert, no distress and comfortable SKIN: skin color, texture, turgor are normal, no rashes or significant lesions EYES: normal, Conjunctiva are pink and non-injected, sclera clear  NECK: supple, thyroid normal size, non-tender, without nodularity LYMPH:  no palpable lymphadenopathy in the cervical, axillary  LUNGS: clear to auscultation and percussion with normal breathing effort HEART: regular rate & rhythm and no murmurs and no lower extremity edema ABDOMEN:abdomen soft, non-tender and normal bowel sounds Musculoskeletal:no cyanosis of digits and no clubbing  NEURO: alert & oriented x 3 with fluent speech, no focal motor/sensory deficits BREAST: No palpable mass, nodules or adenopathy bilaterally. Breast exam benign.   LABORATORY DATA:  I have reviewed the data as listed CBC Latest Ref Rng & Units 03/16/2020 03/17/2019 03/19/2018  WBC 4.0 - 10.5 K/uL 8.8 10.5 10.3  Hemoglobin 12.0 - 15.0 g/dL 13.3 14.1 14.9  Hematocrit 36.0 - 46.0 % 42.2 43.3 46.2(H)  Platelets 150 - 400 K/uL 237 230 213     CMP Latest Ref Rng & Units 03/16/2020 03/17/2019 03/19/2018  Glucose 70 - 99 mg/dL 166(H) 235(H) 187(H)  BUN 8 - 23 mg/dL 7(L) 8 9  Creatinine 0.44 - 1.00 mg/dL 0.89 0.89 0.96  Sodium 135 - 145 mmol/L 140 145 141  Potassium 3.5 - 5.1 mmol/L 4.1 3.2(L) 4.0  Chloride 98 - 111 mmol/L 102 103 102  CO2 22 - 32 mmol/L 35(H) 28 28  Calcium 8.9 - 10.3 mg/dL 10.1 9.8 10.3  Total Protein 6.5 - 8.1 g/dL 6.8 6.8 6.8  Total Bilirubin 0.3 - 1.2 mg/dL 0.5 0.7 0.7  Alkaline Phos 38 - 126 U/L 56 56 59  AST 15 - 41 U/L _0 ALT 0 - 44 U/L 22 28 32      RADIOGRAPHIC STUDIES: I have personally reviewed the radiological images as listed and agreed with the findings in the report. No results  found.    No orders of the defined types were placed in this encounter.  All questions were answered. The patient knows to call the clinic with any problems, questions or concerns. No barriers to learning was detected. The total time spent in the appointment was 25 minutes.  Truitt Merle, MD 03/16/2021   I, Wilburn Mylar, am acting as scribe for Truitt Merle, MD.   I have reviewed the above documentation for accuracy and completeness, and I agree with the above.

## 2021-03-17 ENCOUNTER — Encounter: Payer: Self-pay | Admitting: Hematology

## 2021-03-28 ENCOUNTER — Other Ambulatory Visit: Payer: Self-pay

## 2021-03-28 ENCOUNTER — Telehealth: Payer: Self-pay

## 2021-03-28 DIAGNOSIS — E876 Hypokalemia: Secondary | ICD-10-CM

## 2021-03-28 MED ORDER — POTASSIUM CHLORIDE CRYS ER 20 MEQ PO TBCR: 20.0000 meq | EXTENDED_RELEASE_TABLET | Freq: Two times a day (BID) | ORAL | 1 refills | Status: DC

## 2021-03-28 NOTE — Telephone Encounter (Signed)
-----   Message from Truitt Merle, MD sent at 03/20/2021 11:27 AM EDT ----- Caryl Asp,  Please let pt know her K was low last week, and make sure she is taking oral K (it's on her med list), and increase by 18meq daily for a week, and f/u with her PCP. I am not see her back anymore. I am copying her PCP Dr. Brigitte Pulse here.   Thanks  Krista Blue

## 2021-03-28 NOTE — Telephone Encounter (Signed)
This nurse reached out to patient and made aware of lab results and MD recommendations.  Patient states that she has not been taking potassium and will need a prescription.  This nurse verified pharmacy and sent in the script.  Advised patient to make sure to follow up with her PCP.  Patient acknowledged understanding.  No further questions or concerns at this time.

## 2021-04-12 ENCOUNTER — Ambulatory Visit: Payer: Medicare Other | Admitting: Internal Medicine

## 2021-04-12 ENCOUNTER — Other Ambulatory Visit: Payer: Self-pay

## 2021-04-12 ENCOUNTER — Encounter: Payer: Self-pay | Admitting: Internal Medicine

## 2021-04-12 VITALS — BP 118/60 | HR 63 | Ht 61.0 in | Wt 174.8 lb

## 2021-04-12 DIAGNOSIS — Z01810 Encounter for preprocedural cardiovascular examination: Secondary | ICD-10-CM

## 2021-04-12 DIAGNOSIS — Z01818 Encounter for other preprocedural examination: Secondary | ICD-10-CM

## 2021-04-12 DIAGNOSIS — I2510 Atherosclerotic heart disease of native coronary artery without angina pectoris: Secondary | ICD-10-CM | POA: Diagnosis not present

## 2021-04-12 NOTE — H&P (View-Only) (Signed)
Cardiology Office Note   Date:  04/12/2021   ID:  Elizabeth Hebert, [place] Mar 18, 1946, MRN [id number]  PCP:  Mayra Neer, MD  Cardiologist:   Dorris Carnes, MD   Patient referred for evaluation of atrial fibrillation and lower extremity edema.    History of Present Illness: Elizabeth Hebert is a 75 y.o. female with a history of Crohn's disease, type 2 diabetes, hypertension, CAD (s/p MI 1999  ANgina was L jaw pain ), obesity, anemia she is followed by Serita Grammes.  Also has a history of breast cancer, GE reflux.  The patient was seen in medicine clinic on November 8.  She presented with shortness of breath and lower extremity edema.  Similarly she presented in September with similar complaints of dyspnea lower extremity edema  EKG was not done in Sept.   In September she  was started on Lasix which she says did not help.  Echocardiogram was ordered in September and it showed that the left atria was dilated at 54 mm .  She continued to complain of shortness of breath edema.   In more recent visit she was found to be in atrial fibrillation.  Rate was controlled started on anticoagulation.   The pt says she does not feel like she did when she had an MI in 52    No dizziness   No syncope      Current Meds  Medication Sig   ALPRAZolam (XANAX) 0.25 MG tablet Take 0.25 mg by mouth 2 (two) times daily as needed.   apixaban (ELIQUIS) 5 MG TABS tablet Take 5 mg by mouth 2 (two) times daily.   CALCIUM-MAGNESIUM-VITAMIN D PO Take 370 mg by mouth daily.   cimetidine (TAGAMET) 200 MG tablet Take 200 mg by mouth 2 (two) times daily.   digoxin (LANOXIN) 0.125 MG tablet Take 0.125 mg by mouth daily.   ezetimibe (ZETIA) 10 MG tablet Take 10 mg by mouth daily.   ferrous sulfate 325 (65 FE) MG tablet Take 325 mg by mouth daily with breakfast.   furosemide (LASIX) 40 MG tablet Take 40 mg by mouth daily as needed.   ibandronate (BONIVA) 150 MG tablet Take 150 mg by mouth every 30 (thirty) days.     metFORMIN (GLUCOPHAGE) 1000 MG tablet Take 1,000 mg by mouth 2 (two) times daily with a meal.   metoprolol succinate (TOPROL-XL) 50 MG 24 hr tablet Take 50 mg by mouth 2 (two) times daily. Take with or immediately following a meal.   potassium chloride SA (KLOR-CON) 20 MEQ tablet Take 1 tablet (20 mEq total) by mouth 2 (two) times daily.   rosuvastatin (CRESTOR) 20 MG tablet Take 20 mg by mouth daily.   sertraline (ZOLOFT) 50 MG tablet Take 100 mg by mouth daily.   valsartan-hydrochlorothiazide (DIOVAN-HCT) 320-25 MG per tablet Take 1 tablet by mouth daily.   XULTOPHY 100-3.6 UNIT-MG/ML SOPN Inject into the skin.     Allergies:   Clonidine and Vasotec [enalaprilat]   Past Medical History:  Diagnosis Date   Anemia    Anginal pain ([place])    infreq   Arthritis    Breast cancer ([place]) 12/08/2012   left lumpectomy   Broken arm 2011   Cancer ([place])    breaast   Colitis    Depression    Diabetes mellitus without complication ([place])    Diverticulitis    Fibroid tumor    GERD (gastroesophageal reflux disease)    H/O hiatal hernia  Heart murmur    History of cataract surgery 2012   Hx of hernia repair    Hx of radiation therapy 01/26/13- 02/18/13   left breast 4250 cGy 17 sessions   Hypertension    Myocardial infarction ([place])    stents x 2 when had mi 99   Personal history of radiation therapy 12/08/2012    Past Surgical History:  Procedure Laterality Date   APPENDECTOMY     BREAST LUMPECTOMY Left 12/08/2012   left lumpectomy   CHOLECYSTECTOMY     COLON RESECTION     EYE SURGERY Bilateral    cat    HERNIA REPAIR     rt   MASTECTOMY, PARTIAL Left 12/25/2012   Procedure: MASTECTOMY PARTIAL with re-excision of margins;  Surgeon: Adin Hector, MD;  Location: [facility];  Service: General;  Laterality: Left;   PARTIAL MASTECTOMY WITH NEEDLE LOCALIZATION AND AXILLARY SENTINEL LYMPH NODE BX Left 12/08/2012   Procedure: LEFT PARTIAL MASTECTOMY WITH NEEDLE  LOCALIZATION AND AXILLARY SENTINEL LYMPH NODE BIOPSIES TIMES FOUR;  Surgeon: Adin Hector, MD;  Location: [facility];  Service: General;  Laterality: Left;   TOTAL ABDOMINAL HYSTERECTOMY  2001     Social History:  The patient  reports that she quit smoking about 23 years ago. Her smoking use included cigarettes. She has a 30.00 pack-year smoking history. She has never used smokeless tobacco. She reports current alcohol use. She reports that she does not use drugs.   Family History:  The patient's family history includes Breast cancer in her daughter and paternal grandmother; Breast cancer (age of onset: 71) in her maternal grandmother; Cancer in her cousin; Cervical cancer (age of onset: 13) in her paternal grandmother; Pancreatic cancer in her maternal aunt.    ROS:  Please see the history of present illness. All other systems are reviewed and  Negative to the above problem except as noted.    PHYSICAL EXAM: VS:  BP 118/60 (BP Location: Right Arm, Patient Position: Sitting, Cuff Size: Large)   Pulse 63   Ht 5\' 1"  (1.549 m)   Wt 174 lb 12.8 oz (79.3 kg)   SpO2 93%   BMI 33.03 kg/m   GEN: Well nourished, well developed, in no acute distress  HEENT: normal  Neck: no JVD, carotid bruits, Cardiac   Irreg irreg   no S3  No significant murmurs    2+ edema   Respiratory:  clear to auscultation bilaterally, normal work of breathing GI: soft, nontender, nondistended, + BS  No hepatomegaly  MS: no deformity Moving all extremities   Skin: warm and dry, no rash Neuro:  Strength and sensation are intact Psych: euthymic mood, full affect   EKG:  EKG is ordered today.  Atrial fibrillation   Sl ST deprresion  T wave inversion in II, III, AVF, V3 to V6    ST changes and afib new compared to EKG from 2014.   Lipid Panel No results found for: CHOL, TRIG, HDL, CHOLHDL, VLDL, LDLCALC, LDLDIRECT    Wt Readings from Last 3 Encounters:  04/12/21 174 lb 12.8 oz (79.3 kg)  03/16/21 172 lb 4.8 oz (78.2  kg)  03/16/20 172 lb 3.2 oz (78.1 kg)      ASSESSMENT AND PLAN: 1.  Atrial fibrillation.  Newly documented.    Afib may explain the patient's shortness of breath edema and fatigue.  She is on rate control and just started anticoagulation.  Will check thyroid as well as other electrolytes  again plan for cardioversion in 8 weeks.  Her left atrium is enlarged significantly   Will need to see how she holds.  She denies any symptoms to suggest sleep apnea.   2  Diastolic CHF    Unfort echo done outside  Cannot review images   LVEF reported normal  LV normal thickness   Atrial again dilated   Mild MR and TR. Will review labs    May increase diuresis   Limit fluid and salt     2 history of hypertension follow blood pressure is controlled   3 history of CAD.  Patient has known coronary artery disease with stent to the right coronary artery.  Her symptoms are different from what she had with the MI (jaw pain feeling sick she is not having any of that now.  LVEF is normal I would follow I am not convinced this is angina  4  Dyslipidemia.  Keep on rosuvastatin.    Current medicines are reviewed at length with the patient today.  The patient does not have concerns regarding medicines.  Signed, Dorris Carnes, MD  04/12/2021 3:03 PM    [facility] [address], [place], [place]  [address] Phone: [phone]; Fax: [phone]

## 2021-04-12 NOTE — Patient Instructions (Signed)
Medication Instructions:  Your physician recommends that you continue on your current medications as directed. Please refer to the Current Medication list given to you today.  *If you need a refill on your cardiac medications before your next appointment, please call your pharmacy*   Lab Work: TSH, BMET, PRO BNP, CBC If you have labs (blood work) drawn today and your tests are completely normal, you will receive your results only by: [place] (if you have MyChart) OR A paper copy in the mail If you have any lab test that is abnormal or we need to change your treatment, we will call you to review the results.   Testing/Procedures: Your physician has recommended that you have a Cardioversion (DCCV). Electrical Cardioversion uses a jolt of electricity to your heart either through paddles or wired patches attached to your chest. This is a controlled, usually prescheduled, procedure. Defibrillation is done under light anesthesia in the hospital, and you usually go home the day of the procedure. This is done to get your heart back into a normal rhythm. You are not awake for the procedure. Please see the instruction sheet given to you today.    Follow-Up: At [facility], you and your health needs are our priority.  As part of our continuing mission to provide you with exceptional heart care, we have created designated Provider Care Teams.  These Care Teams include your primary Cardiologist (physician) and Advanced Practice Providers (APPs -  Physician Assistants and Nurse Practitioners) who all work together to provide you with the care you need, when you need it.  We recommend signing up for the patient portal called "MyChart".  Sign up information is provided on this After Visit Summary.  MyChart is used to connect with patients for Virtual Visits (Telemedicine).  Patients are able to view lab/test results, encounter notes, upcoming appointments, etc.  Non-urgent messages can be sent to  your provider as well.   To learn more about what you can do with MyChart, go to [email or web address].    Your next appointment:   2 month(s)  The format for your next appointment:   In Person  Provider:   DR. Harrington Challenger OR AN APP    Other Instructions

## 2021-04-12 NOTE — Progress Notes (Addendum)
Cardiology Office Note   Date:  04/12/2021   ID:  Elizabeth Hebert September 08, 1945, MRN [id number]  PCP:  Mayra Neer, MD  Cardiologist:   Dorris Carnes, MD   Patient referred for evaluation of atrial fibrillation and lower extremity edema.    History of Present Illness: Elizabeth Hebert is a 75 y.o. female with a history of Crohn's disease, type 2 diabetes, hypertension, CAD (s/p MI 1999  ANgina was L jaw pain ), obesity, anemia she is followed by Elizabeth Hebert.  Also has a history of breast cancer, GE reflux.  The patient was seen in medicine clinic on November 8.  She presented with shortness of breath and lower extremity edema.  Similarly she presented in September with similar complaints of dyspnea lower extremity edema  EKG was not done in Sept.   In September she  was started on Lasix which she says did not help.  Echocardiogram was ordered in September and it showed that the left atria was dilated at 54 mm .  She continued to complain of shortness of breath edema.   In more recent visit she was found to be in atrial fibrillation.  Rate was controlled started on anticoagulation.   The pt says she does not feel like she did when she had an MI in 13    No dizziness   No syncope      Current Meds  Medication Sig   ALPRAZolam (XANAX) 0.25 MG tablet Take 0.25 mg by mouth 2 (two) times daily as needed.   apixaban (ELIQUIS) 5 MG TABS tablet Take 5 mg by mouth 2 (two) times daily.   CALCIUM-MAGNESIUM-VITAMIN D PO Take 370 mg by mouth daily.   cimetidine (TAGAMET) 200 MG tablet Take 200 mg by mouth 2 (two) times daily.   digoxin (LANOXIN) 0.125 MG tablet Take 0.125 mg by mouth daily.   ezetimibe (ZETIA) 10 MG tablet Take 10 mg by mouth daily.   ferrous sulfate 325 (65 FE) MG tablet Take 325 mg by mouth daily with breakfast.   furosemide (LASIX) 40 MG tablet Take 40 mg by mouth daily as needed.   ibandronate (BONIVA) 150 MG tablet Take 150 mg by mouth every 30 (thirty) days.     metFORMIN (GLUCOPHAGE) 1000 MG tablet Take 1,000 mg by mouth 2 (two) times daily with a meal.   metoprolol succinate (TOPROL-XL) 50 MG 24 hr tablet Take 50 mg by mouth 2 (two) times daily. Take with or immediately following a meal.   potassium chloride SA (KLOR-CON) 20 MEQ tablet Take 1 tablet (20 mEq total) by mouth 2 (two) times daily.   rosuvastatin (CRESTOR) 20 MG tablet Take 20 mg by mouth daily.   sertraline (ZOLOFT) 50 MG tablet Take 100 mg by mouth daily.   valsartan-hydrochlorothiazide (DIOVAN-HCT) 320-25 MG per tablet Take 1 tablet by mouth daily.   XULTOPHY 100-3.6 UNIT-MG/ML SOPN Inject into the skin.     Allergies:   Clonidine and Vasotec [enalaprilat]   Past Medical History:  Diagnosis Date   Anemia    Anginal pain ([place])    infreq   Arthritis    Breast cancer ([place]) 12/08/2012   left lumpectomy   Broken arm 2011   Cancer ([place])    breaast   Colitis    Depression    Diabetes mellitus without complication ([place])    Diverticulitis    Fibroid tumor    GERD (gastroesophageal reflux disease)    H/O hiatal hernia  Heart murmur    History of cataract surgery 2012   Hx of hernia repair    Hx of radiation therapy 01/26/13- 02/18/13   left breast 4250 cGy 17 sessions   Hypertension    Myocardial infarction ([place])    stents x 2 when had mi 99   Personal history of radiation therapy 12/08/2012    Past Surgical History:  Procedure Laterality Date   APPENDECTOMY     BREAST LUMPECTOMY Left 12/08/2012   left lumpectomy   CHOLECYSTECTOMY     COLON RESECTION     EYE SURGERY Bilateral    cat    HERNIA REPAIR     rt   MASTECTOMY, PARTIAL Left 12/25/2012   Procedure: MASTECTOMY PARTIAL with re-excision of margins;  Surgeon: Adin Hector, MD;  Location: [facility];  Service: General;  Laterality: Left;   PARTIAL MASTECTOMY WITH NEEDLE LOCALIZATION AND AXILLARY SENTINEL LYMPH NODE BX Left 12/08/2012   Procedure: LEFT PARTIAL MASTECTOMY WITH NEEDLE  LOCALIZATION AND AXILLARY SENTINEL LYMPH NODE BIOPSIES TIMES FOUR;  Surgeon: Adin Hector, MD;  Location: [facility];  Service: General;  Laterality: Left;   TOTAL ABDOMINAL HYSTERECTOMY  2001     Social History:  The patient  reports that she quit smoking about 23 years ago. Her smoking use included cigarettes. She has a 30.00 pack-year smoking history. She has never used smokeless tobacco. She reports current alcohol use. She reports that she does not use drugs.   Family History:  The patient's family history includes Breast cancer in her daughter and paternal grandmother; Breast cancer (age of onset: 39) in her maternal grandmother; Cancer in her cousin; Cervical cancer (age of onset: 83) in her paternal grandmother; Pancreatic cancer in her maternal aunt.    ROS:  Please see the history of present illness. All other systems are reviewed and  Negative to the above problem except as noted.    PHYSICAL EXAM: VS:  BP 118/60 (BP Location: Right Arm, Patient Position: Sitting, Cuff Size: Large)   Pulse 63   Ht 5\' 1"  (1.549 m)   Wt 174 lb 12.8 oz (79.3 kg)   SpO2 93%   BMI 33.03 kg/m   GEN: Well nourished, well developed, in no acute distress  HEENT: normal  Neck: no JVD, carotid bruits, Cardiac   Irreg irreg   no S3  No significant murmurs    2+ edema   Respiratory:  clear to auscultation bilaterally, normal work of breathing GI: soft, nontender, nondistended, + BS  No hepatomegaly  MS: no deformity Moving all extremities   Skin: warm and dry, no rash Neuro:  Strength and sensation are intact Psych: euthymic mood, full affect   EKG:  EKG is ordered today.  Atrial fibrillation   Sl ST deprresion  T wave inversion in II, III, AVF, V3 to V6    ST changes and afib new compared to EKG from 2014.   Lipid Panel No results found for: CHOL, TRIG, HDL, CHOLHDL, VLDL, LDLCALC, LDLDIRECT    Wt Readings from Last 3 Encounters:  04/12/21 174 lb 12.8 oz (79.3 kg)  03/16/21 172 lb 4.8 oz (78.2  kg)  03/16/20 172 lb 3.2 oz (78.1 kg)      ASSESSMENT AND PLAN: 1.  Atrial fibrillation.  Newly documented.    Afib may explain the patient's shortness of breath edema and fatigue.  She is on rate control and just started anticoagulation.  Will check thyroid as well as other electrolytes  again plan for cardioversion in 8 weeks.  Her left atrium is enlarged significantly   Will need to see how she holds.  She denies any symptoms to suggest sleep apnea.   2  Diastolic CHF    Unfort echo done outside  Cannot review images   LVEF reported normal  LV normal thickness   Atrial again dilated   Mild MR and TR. Will review labs    May increase diuresis   Limit fluid and salt     2 history of hypertension follow blood pressure is controlled   3 history of CAD.  Patient has known coronary artery disease with stent to the right coronary artery.  Her symptoms are different from what she had with the MI (jaw pain feeling sick she is not having any of that now.  LVEF is normal I would follow I am not convinced this is angina  4  Dyslipidemia.  Keep on rosuvastatin.    Current medicines are reviewed at length with the patient today.  The patient does not have concerns regarding medicines.  Signed, Dorris Carnes, MD  04/12/2021 3:03 PM    [facility] [address], [place], [place]  [address] Phone: [phone]; Fax: [phone]

## 2021-04-13 ENCOUNTER — Other Ambulatory Visit: Payer: Self-pay | Admitting: *Deleted

## 2021-04-13 DIAGNOSIS — I10 Essential (primary) hypertension: Secondary | ICD-10-CM

## 2021-04-13 DIAGNOSIS — I2510 Atherosclerotic heart disease of native coronary artery without angina pectoris: Secondary | ICD-10-CM

## 2021-04-13 LAB — BASIC METABOLIC PANEL
BUN/Creatinine Ratio: 12 (ref 12–28)
BUN: 11 mg/dL (ref 8–27)
CO2: 29 mmol/L (ref 20–29)
Calcium: 8.9 mg/dL (ref 8.7–10.3)
Chloride: 101 mmol/L (ref 96–106)
Creatinine, Ser: 0.92 mg/dL (ref 0.57–1.00)
Glucose: 108 mg/dL — ABNORMAL HIGH (ref 70–99)
Potassium: 3.6 mmol/L (ref 3.5–5.2)
Sodium: 144 mmol/L (ref 134–144)
eGFR: 64 mL/min/{1.73_m2} (ref >59)

## 2021-04-13 LAB — CBC
Hematocrit: 41.5 % (ref 34.0–46.6)
Hemoglobin: 13.8 g/dL (ref 11.1–15.9)
MCH: 30.3 pg (ref 26.6–33.0)
MCHC: 33.3 g/dL (ref 31.5–35.7)
MCV: 91 fL (ref 79–97)
Platelets: 233 10*3/uL (ref 150–450)
RBC: 4.55 x10E6/uL (ref 3.77–5.28)
RDW: 15.5 % — ABNORMAL HIGH (ref 11.7–15.4)
WBC: 6.9 10*3/uL (ref 3.4–10.8)

## 2021-04-13 LAB — TSH: TSH: 2.95 u[IU]/mL (ref 0.450–4.500)

## 2021-04-13 LAB — PRO B NATRIURETIC PEPTIDE: NT-Pro BNP: 3715 pg/mL — ABNORMAL HIGH (ref 0–738)

## 2021-04-23 ENCOUNTER — Other Ambulatory Visit: Payer: Medicare Other | Admitting: *Deleted

## 2021-04-23 ENCOUNTER — Encounter (HOSPITAL_COMMUNITY): Payer: Self-pay | Admitting: Cardiology

## 2021-04-23 ENCOUNTER — Other Ambulatory Visit: Payer: Self-pay

## 2021-04-23 DIAGNOSIS — I10 Essential (primary) hypertension: Secondary | ICD-10-CM

## 2021-04-23 DIAGNOSIS — I2510 Atherosclerotic heart disease of native coronary artery without angina pectoris: Secondary | ICD-10-CM

## 2021-04-23 NOTE — Progress Notes (Signed)
Attempted to obtain medical history via telephone, unable to reach at this time. I left a voicemail to return pre surgical testing department's phone call.  

## 2021-04-24 LAB — BASIC METABOLIC PANEL
BUN/Creatinine Ratio: 10 — ABNORMAL LOW (ref 12–28)
BUN: 11 mg/dL (ref 8–27)
CO2: 24 mmol/L (ref 20–29)
Calcium: 9.8 mg/dL (ref 8.7–10.3)
Chloride: 98 mmol/L (ref 96–106)
Creatinine, Ser: 1.13 mg/dL — ABNORMAL HIGH (ref 0.57–1.00)
Glucose: 103 mg/dL — ABNORMAL HIGH (ref 70–99)
Potassium: 5 mmol/L (ref 3.5–5.2)
Sodium: 141 mmol/L (ref 134–144)
eGFR: 50 mL/min/{1.73_m2} — ABNORMAL LOW (ref >59)

## 2021-04-24 LAB — PRO B NATRIURETIC PEPTIDE: NT-Pro BNP: 2114 pg/mL — ABNORMAL HIGH (ref 0–738)

## 2021-04-30 ENCOUNTER — Encounter: Payer: Self-pay | Admitting: Internal Medicine

## 2021-04-30 DIAGNOSIS — I10 Essential (primary) hypertension: Secondary | ICD-10-CM

## 2021-04-30 DIAGNOSIS — I2510 Atherosclerotic heart disease of native coronary artery without angina pectoris: Secondary | ICD-10-CM

## 2021-04-30 NOTE — Telephone Encounter (Signed)
Called pt reviewed results and MD recommendation.  F/u lab draw scheduled for 05/11/21  Called pt back to inform that labs will be drawn with pre DCCV labs on 05/08/21.  Pt verbalizes understanding.  Willeen Cass, RN added labs to hospital orders to be drawn.  Orders canceled.

## 2021-05-08 ENCOUNTER — Encounter (HOSPITAL_COMMUNITY): Payer: Self-pay | Admitting: Cardiology

## 2021-05-08 ENCOUNTER — Other Ambulatory Visit: Payer: Self-pay

## 2021-05-08 ENCOUNTER — Ambulatory Visit (HOSPITAL_COMMUNITY)
Admission: RE | Admit: 2021-05-08 | Discharge: 2021-05-08 | Disposition: A | Discharge status: Home / Self Care | Payer: Medicare Other | Attending: Cardiology | Admitting: Cardiology

## 2021-05-08 ENCOUNTER — Ambulatory Visit (HOSPITAL_COMMUNITY): Payer: Medicare Other | Admitting: Certified Registered"

## 2021-05-08 ENCOUNTER — Encounter (HOSPITAL_COMMUNITY)
Admission: RE | Disposition: A | Discharge status: Home / Self Care | Payer: Self-pay | Source: Home / Self Care | Attending: Cardiology

## 2021-05-08 DIAGNOSIS — I5030 Unspecified diastolic (congestive) heart failure: Secondary | ICD-10-CM | POA: Diagnosis not present

## 2021-05-08 DIAGNOSIS — Z87891 Personal history of nicotine dependence: Secondary | ICD-10-CM | POA: Diagnosis not present

## 2021-05-08 DIAGNOSIS — Z7901 Long term (current) use of anticoagulants: Secondary | ICD-10-CM | POA: Insufficient documentation

## 2021-05-08 DIAGNOSIS — I110 Hypertensive heart disease with heart failure: Secondary | ICD-10-CM | POA: Insufficient documentation

## 2021-05-08 DIAGNOSIS — I4891 Unspecified atrial fibrillation: Secondary | ICD-10-CM | POA: Diagnosis present

## 2021-05-08 DIAGNOSIS — I252 Old myocardial infarction: Secondary | ICD-10-CM | POA: Diagnosis not present

## 2021-05-08 DIAGNOSIS — I4819 Other persistent atrial fibrillation: Secondary | ICD-10-CM

## 2021-05-08 DIAGNOSIS — E119 Type 2 diabetes mellitus without complications: Secondary | ICD-10-CM | POA: Diagnosis not present

## 2021-05-08 DIAGNOSIS — I2510 Atherosclerotic heart disease of native coronary artery without angina pectoris: Secondary | ICD-10-CM | POA: Insufficient documentation

## 2021-05-08 HISTORY — PX: CARDIOVERSION: SHX1299

## 2021-05-08 LAB — BASIC METABOLIC PANEL
Anion gap: 10 (ref 5–15)
BUN: 11 mg/dL (ref 8–23)
CO2: 32 mmol/L (ref 22–32)
Calcium: 9.6 mg/dL (ref 8.9–10.3)
Chloride: 97 mmol/L — ABNORMAL LOW (ref 98–111)
Creatinine, Ser: 1.04 mg/dL — ABNORMAL HIGH (ref 0.44–1.00)
GFR, Estimated: 55 mL/min — ABNORMAL LOW (ref >60)
Glucose, Bld: 101 mg/dL — ABNORMAL HIGH (ref 70–99)
Potassium: 3.2 mmol/L — ABNORMAL LOW (ref 3.5–5.1)
Sodium: 139 mmol/L (ref 135–145)

## 2021-05-08 LAB — GLUCOSE, CAPILLARY: Glucose-Capillary: 107 mg/dL — ABNORMAL HIGH (ref 70–99)

## 2021-05-08 LAB — BRAIN NATRIURETIC PEPTIDE: B Natriuretic Peptide: 309.3 pg/mL — ABNORMAL HIGH (ref 0.0–100.0)

## 2021-05-08 SURGERY — CARDIOVERSION
Anesthesia: General

## 2021-05-08 MED ORDER — SODIUM CHLORIDE 0.9 % IV SOLN: INTRAVENOUS | Status: DC

## 2021-05-08 MED ORDER — LIDOCAINE 2% (20 MG/ML) 5 ML SYRINGE
INTRAMUSCULAR | Status: DC | PRN
Start: 2021-05-08 — End: 2021-05-08
  Administered 2021-05-08: 20 mg via INTRAVENOUS

## 2021-05-08 MED ORDER — PROPOFOL 10 MG/ML IV BOLUS
INTRAVENOUS | Status: DC | PRN
  Administered 2021-05-08: 10 mg via INTRAVENOUS
  Administered 2021-05-08: 40 mg via INTRAVENOUS

## 2021-05-08 NOTE — Interval H&P Note (Signed)
History and Physical Interval Note:  05/08/2021 11:17 AM  Elizabeth Hebert  has presented today for surgery, with the diagnosis of AFIB.  The various methods of treatment have been discussed with the patient and family. After consideration of risks, benefits and other options for treatment, the patient has consented to  Procedure(s): CARDIOVERSION (N/A) as a surgical intervention.  The patient's history has been reviewed, patient examined, no change in status, stable for surgery.  I have reviewed the patient's chart and labs.  Questions were answered to the patient's satisfaction.     Donato Heinz

## 2021-05-08 NOTE — Anesthesia Preprocedure Evaluation (Addendum)
Anesthesia Evaluation  Patient identified by MRN, date of birth, ID band Patient awake    Reviewed: Allergy & Precautions, NPO status , Patient's Chart, lab work & pertinent test results  History of Anesthesia Complications Negative for: history of anesthetic complications  Airway Mallampati: III  TM Distance: >3 FB Neck ROM: Full    Dental  (+) Dental Advisory Given   Pulmonary neg shortness of breath, neg COPD, former smoker,    breath sounds clear to auscultation       Cardiovascular hypertension, + CAD and + Past MI  + dysrhythmias  Rhythm:Irregular     Neuro/Psych PSYCHIATRIC DISORDERS Depression negative neurological ROS     GI/Hepatic Neg liver ROS, hiatal hernia, GERD  ,  Endo/Other  diabetes  Renal/GU Renal diseaseLab Results      Component                Value               Date                      CREATININE               1.13 (H)            04/23/2021                Musculoskeletal  (+) Arthritis ,   Abdominal   Peds  Hematology negative hematology ROS (+) anemia , Lab Results      Component                Value               Date                      WBC                      6.9                 04/12/2021                HGB                      13.8                04/12/2021                HCT                      41.5                04/12/2021                MCV                      91                  04/12/2021                PLT                      233                 04/12/2021              Anesthesia Other Findings   Reproductive/Obstetrics  Anesthesia Physical Anesthesia Plan  ASA: 3  Anesthesia Plan: General   Post-op Pain Management: Minimal or no pain anticipated   Induction: Intravenous  PONV Risk Score and Plan: 3 and Treatment may vary due to age or medical condition  Airway Management Planned: Mask  Additional Equipment:  None  Intra-op Plan:   Post-operative Plan:   Informed Consent: I have reviewed the patients History and Physical, chart, labs and discussed the procedure including the risks, benefits and alternatives for the proposed anesthesia with the patient or authorized representative who has indicated his/her understanding and acceptance.     Dental advisory given  Plan Discussed with: CRNA and Anesthesiologist  Anesthesia Plan Comments:        Anesthesia Quick Evaluation

## 2021-05-08 NOTE — Progress Notes (Signed)
Left voicemail to see if pt is planning to come in for her cardioverson.  Also tried alternate contact phone number - no answer.

## 2021-05-08 NOTE — CV Procedure (Signed)
Procedure:   DCCV  Indication:  Symptomatic atrial fibrillation  Procedure Note:  The patient signed informed consent.  They have had had therapeutic anticoagulation with Eliquis greater than 3 weeks.  Anesthesia was administered by Dr. Ermalene Postin and Barrett Henle, CRNA.  Adequate airway was maintained throughout and vital followed per protocol.  They were cardioverted x 1 with 200J of biphasic synchronized energy.  They converted to NSR with frequent PACs, rate 60s.  There were no apparent complications.  The patient had normal neuro status and respiratory status post procedure with vitals stable as recorded elsewhere.    Follow up:  They will continue on current medical therapy and follow up with cardiology as scheduled.  Oswaldo Milian, MD 05/08/2021 11:54 AM

## 2021-05-08 NOTE — Transfer of Care (Signed)
Immediate Anesthesia Transfer of Care Note  Patient: Elizabeth Hebert  Procedure(s) Performed: CARDIOVERSION  Patient Location: Endoscopy Unit  Anesthesia Type:General  Level of Consciousness: sedated and patient cooperative  Airway & Oxygen Therapy: Patient Spontanous Breathing and Patient connected to nasal cannula oxygen  Post-op Assessment: Report given to RN, Post -op Vital signs reviewed and stable and Patient moving all extremities  Post vital signs: Reviewed and stable  Last Vitals:  Vitals Value Taken Time  BP    Temp    Pulse    Resp    SpO2      Last Pain:  Vitals:   05/08/21 1100  TempSrc: Other (Comment)  PainSc: 0-No pain         Complications: No notable events documented.

## 2021-05-09 ENCOUNTER — Encounter (HOSPITAL_COMMUNITY): Payer: Self-pay | Admitting: Cardiology

## 2021-05-09 ENCOUNTER — Telehealth: Payer: Self-pay | Admitting: Internal Medicine

## 2021-05-09 NOTE — Telephone Encounter (Signed)
Pt advised to stay on the Eliquis and I will forward to Dr. Harrington Challenger to see when she would like to se her back in clinic.   Pt says she is feeling well.

## 2021-05-09 NOTE — Anesthesia Postprocedure Evaluation (Signed)
Anesthesia Post Note  Patient: Elizabeth Hebert  Procedure(s) Performed: CARDIOVERSION     Patient location during evaluation: Endoscopy Anesthesia Type: General Level of consciousness: awake and patient cooperative Pain management: pain level controlled Vital Signs Assessment: post-procedure vital signs reviewed and stable Respiratory status: spontaneous breathing, nonlabored ventilation, respiratory function stable and patient connected to nasal cannula oxygen Cardiovascular status: blood pressure returned to baseline and stable Postop Assessment: no apparent nausea or vomiting Anesthetic complications: no   No notable events documented.  Last Vitals:  Vitals:   05/08/21 1210 05/08/21 1219  BP: (!) 139/54 (!) 115/53  Pulse: 66 (!) 52  Resp: (!) 25 18  Temp:    SpO2: 97% (!) 88%    Last Pain:  Vitals:   05/08/21 1206  TempSrc:   PainSc: 0-No pain                 Elkin Belfield

## 2021-05-09 NOTE — Telephone Encounter (Signed)
   Pt c/o medication issue:  1. Name of Medication: apixaban (ELIQUIS) 5 MG TABS tablet  2. How are you currently taking this medication (dosage and times per day)? Take 5 mg by mouth 2 (two) times daily.  3. Are you having a reaction (difficulty breathing--STAT)?   4. What is your medication issue? Pt said, she just had his cardioversion yesterday and wanted to check in with Dr. Harrington Challenger if sh needs to keep taking her eliquis

## 2021-05-10 ENCOUNTER — Telehealth: Payer: Self-pay

## 2021-05-10 DIAGNOSIS — E876 Hypokalemia: Secondary | ICD-10-CM

## 2021-05-10 DIAGNOSIS — Z79899 Other long term (current) drug therapy: Secondary | ICD-10-CM

## 2021-05-10 DIAGNOSIS — I2510 Atherosclerotic heart disease of native coronary artery without angina pectoris: Secondary | ICD-10-CM

## 2021-05-10 MED ORDER — POTASSIUM CHLORIDE CRYS ER 20 MEQ PO TBCR: 20.0000 meq | EXTENDED_RELEASE_TABLET | Freq: Two times a day (BID) | ORAL | 6 refills | Status: DC

## 2021-05-10 MED ORDER — FUROSEMIDE 40 MG PO TABS: 40.0000 mg | ORAL_TABLET | Freq: Every day | ORAL | 6 refills | Status: DC

## 2021-05-10 NOTE — Telephone Encounter (Signed)
Made an appt for the pt to be seen 11/14/2021.

## 2021-05-10 NOTE — Telephone Encounter (Signed)
Pt says she had a few missed doses of her K but will pick up from the pharmacy today and take right away.   She will have repeat labs 05/14/21.

## 2021-05-10 NOTE — Telephone Encounter (Signed)
-----   Message from Dorris Carnes V, MD sent at 05/09/2021  9:28 PM EST ----- Patients fluid was up a little and her Potassium was a little low  Things should improve now that she is in SR    Is she taking KCL 2x per day?  K is a little low     I would cut back lasix to 40 daily Follow up BMET on Monday with MG

## 2021-05-14 ENCOUNTER — Other Ambulatory Visit: Payer: Self-pay

## 2021-05-14 ENCOUNTER — Other Ambulatory Visit: Payer: Medicare Other | Admitting: *Deleted

## 2021-05-14 DIAGNOSIS — Z79899 Other long term (current) drug therapy: Secondary | ICD-10-CM

## 2021-05-14 DIAGNOSIS — E876 Hypokalemia: Secondary | ICD-10-CM

## 2021-05-14 DIAGNOSIS — I2510 Atherosclerotic heart disease of native coronary artery without angina pectoris: Secondary | ICD-10-CM

## 2021-05-14 LAB — BASIC METABOLIC PANEL
BUN/Creatinine Ratio: 11 — ABNORMAL LOW (ref 12–28)
BUN: 11 mg/dL (ref 8–27)
CO2: 35 mmol/L — ABNORMAL HIGH (ref 20–29)
Calcium: 10.1 mg/dL (ref 8.7–10.3)
Chloride: 100 mmol/L (ref 96–106)
Creatinine, Ser: 0.96 mg/dL (ref 0.57–1.00)
Glucose: 105 mg/dL — ABNORMAL HIGH (ref 70–99)
Potassium: 4.1 mmol/L (ref 3.5–5.2)
Sodium: 141 mmol/L (ref 134–144)
eGFR: 61 mL/min/{1.73_m2} (ref >59)

## 2021-05-14 LAB — MAGNESIUM: Magnesium: 1.9 mg/dL (ref 1.6–2.3)

## 2021-09-11 ENCOUNTER — Other Ambulatory Visit: Payer: Self-pay | Admitting: Family Medicine

## 2021-09-11 ENCOUNTER — Ambulatory Visit
Admission: RE | Admit: 2021-09-11 | Discharge: 2021-09-11 | Disposition: A | Discharge status: Home / Self Care | Payer: Medicare Other | Source: Ambulatory Visit | Attending: Family Medicine | Admitting: Family Medicine

## 2021-09-11 DIAGNOSIS — R059 Cough, unspecified: Secondary | ICD-10-CM

## 2021-10-15 ENCOUNTER — Other Ambulatory Visit: Payer: Self-pay | Admitting: Family Medicine

## 2021-10-15 ENCOUNTER — Ambulatory Visit
Admission: RE | Admit: 2021-10-15 | Discharge: 2021-10-15 | Disposition: A | Discharge status: Home / Self Care | Payer: Medicare Other | Source: Ambulatory Visit | Attending: Family Medicine | Admitting: Family Medicine

## 2021-10-15 DIAGNOSIS — J690 Pneumonitis due to inhalation of food and vomit: Secondary | ICD-10-CM

## 2021-11-01 IMAGING — MG MM DIGITAL SCREENING BILAT W/ TOMO AND CAD
6 of 10 series · 6 of 30 positions shown · non-contrast
Comparison: Previous exam(s).

CLINICAL DATA: Screening.

EXAM:
DIGITAL SCREENING BILATERAL MAMMOGRAM WITH TOMO AND CAD

[R CC synth-2D]
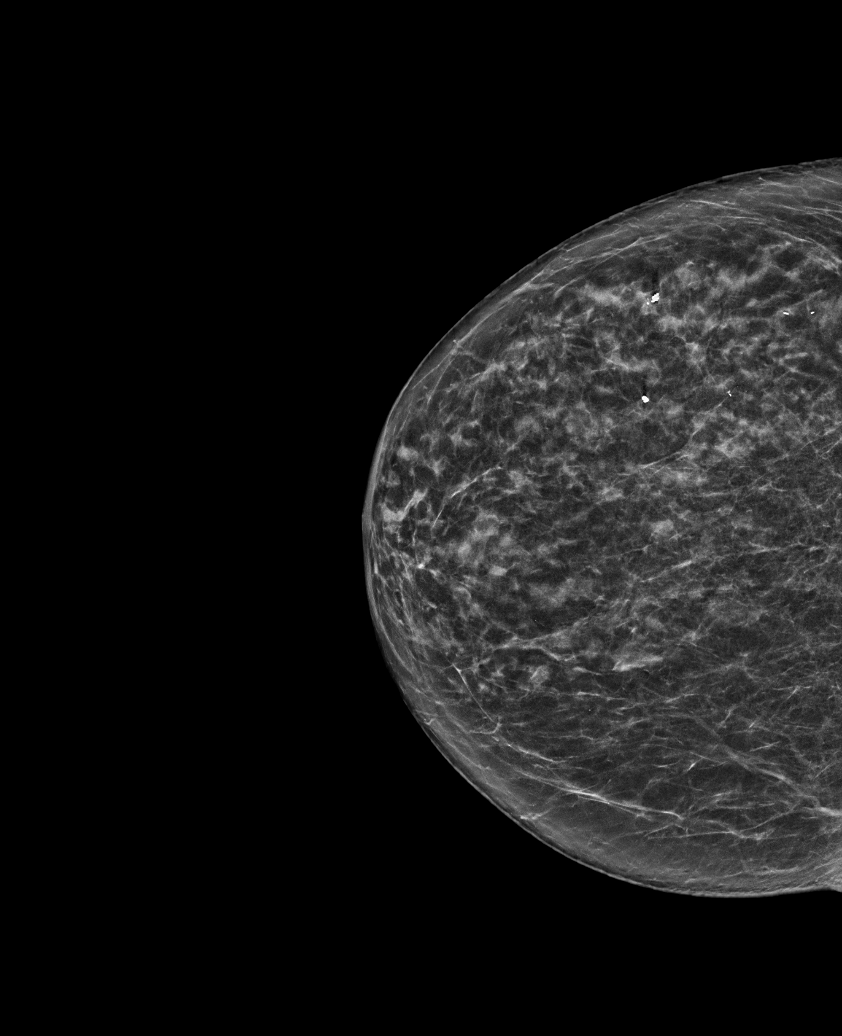

[L XCCL synth-2D]
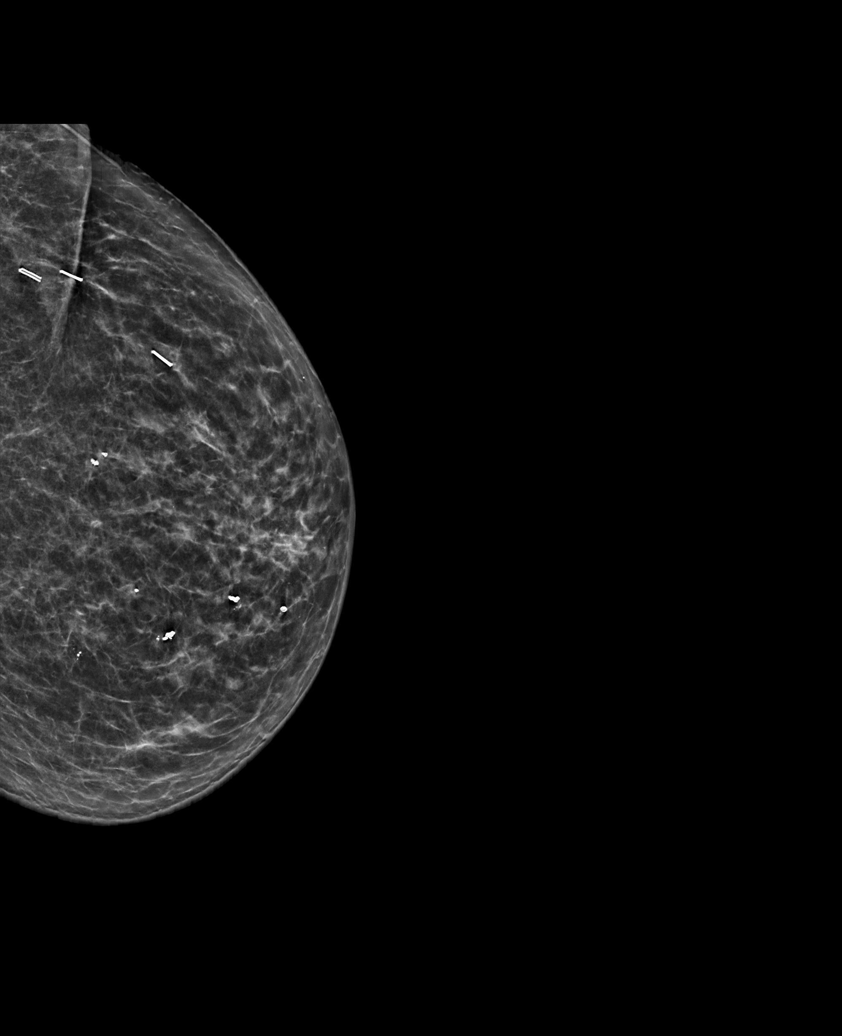

[L MLO synth-2D]
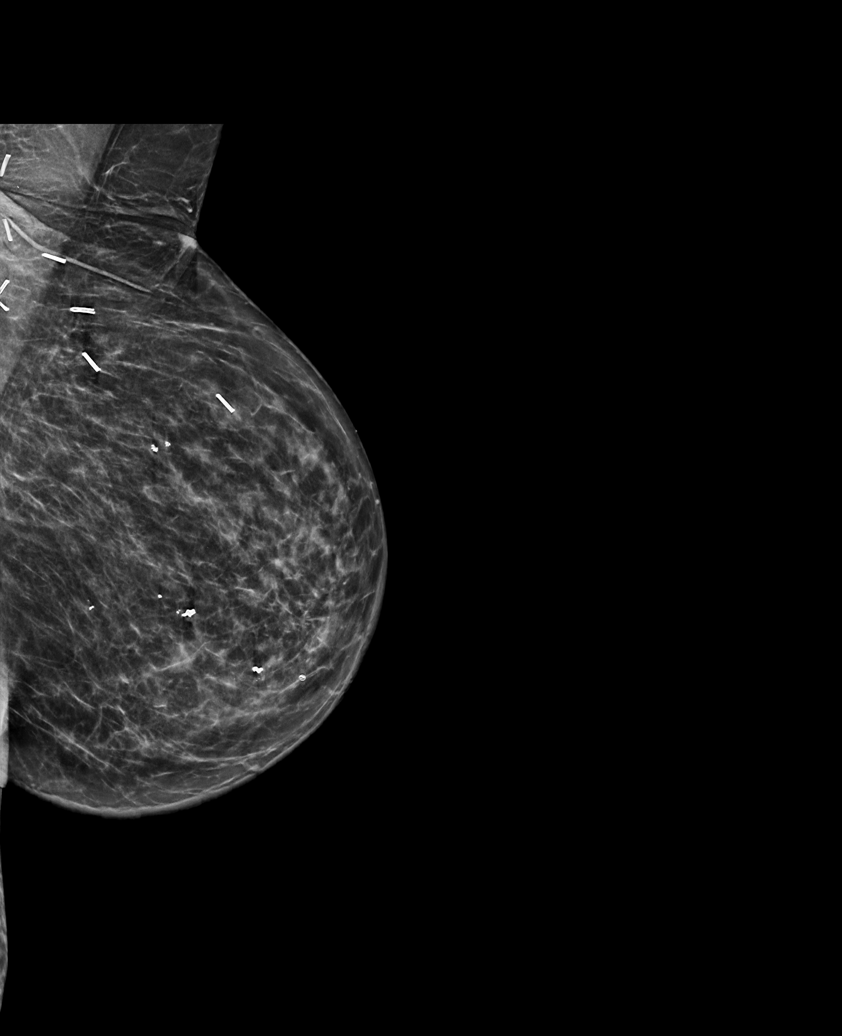

[L CC synth-2D]
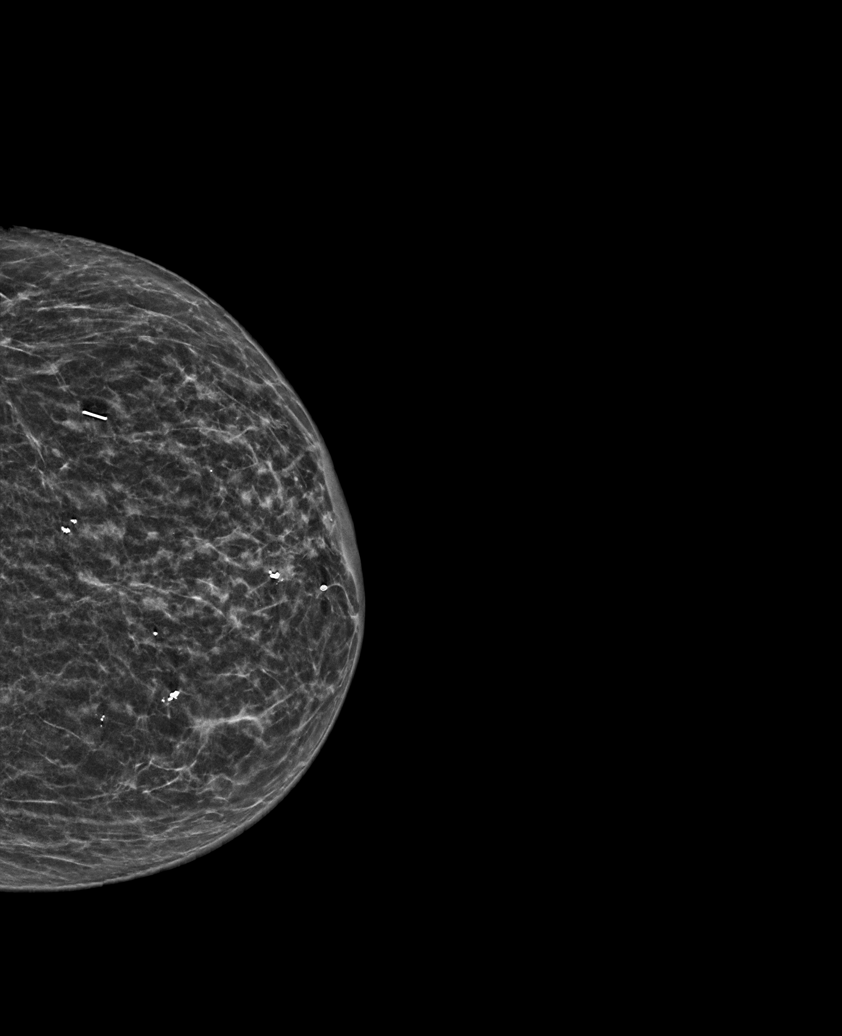

[R MLO synth-2D]
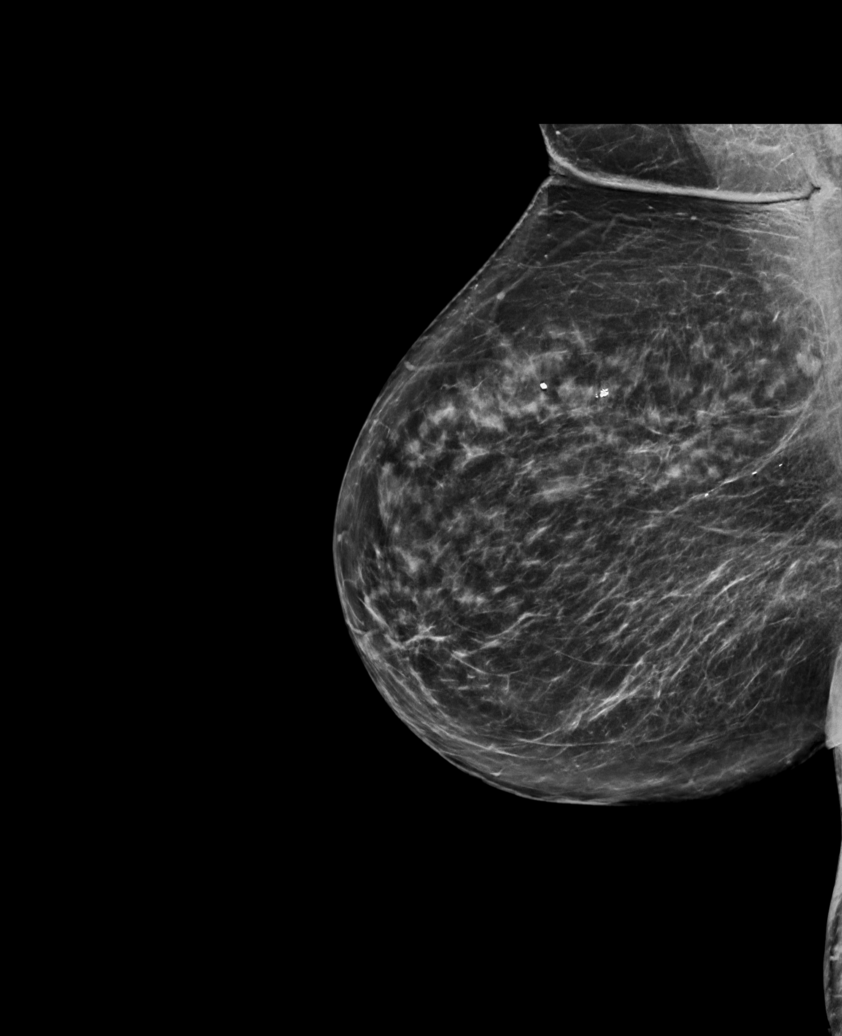

[L MLO tomo · tomo slice 33/66.0]
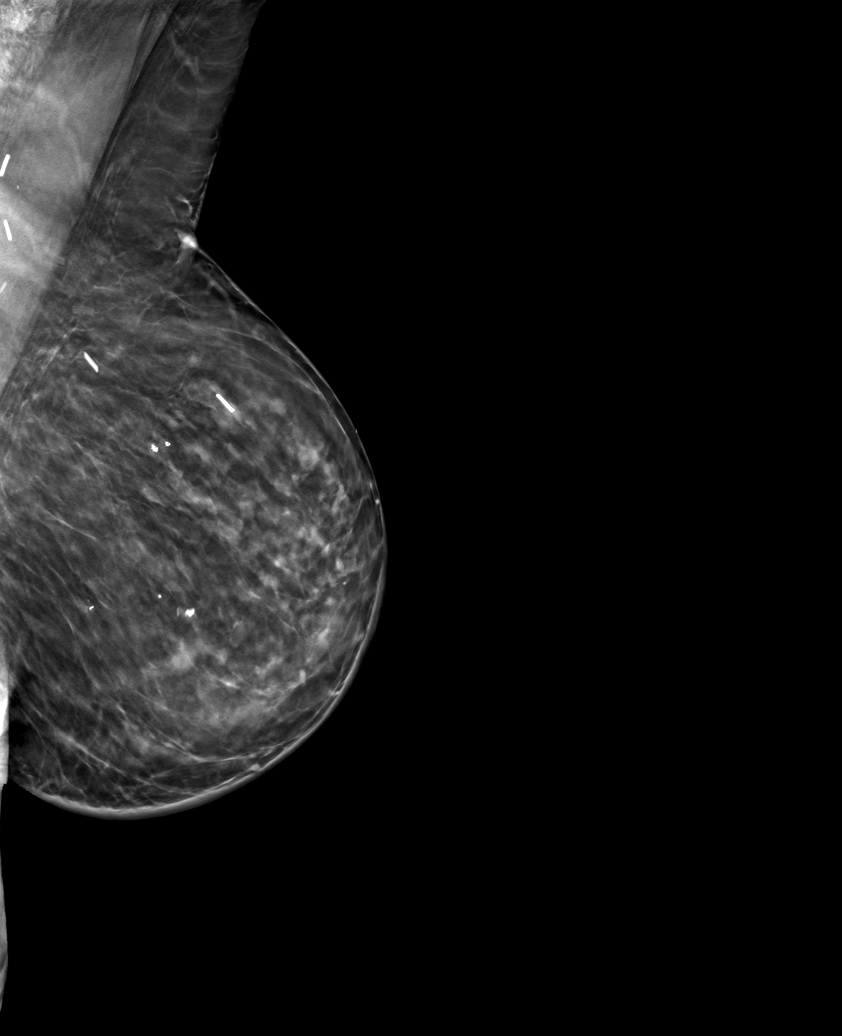

[6 of 30 positions shown; findings below may reference images not displayed]

ACR Breast Density Category c: The breast tissue is heterogeneously
dense, which may obscure small masses.
FINDINGS: In the right breast, a possible asymmetry warrants further
evaluation. In the left breast, no findings suspicious for
malignancy. The images were evaluated with computer-aided detection.
IMPRESSION: Further evaluation is suggested for possible asymmetry in the right
breast.

RECOMMENDATION:
Diagnostic mammogram and possibly ultrasound of the right breast.
(Code:[id number])

The patient will be contacted regarding the findings, and additional
imaging will be scheduled.

BI-RADS CATEGORY  0: Incomplete. Need additional imaging evaluation
and/or prior mammograms for comparison.

## 2021-11-13 NOTE — Progress Notes (Unsigned)
Cardiology Office Note   Date:  11/14/2021   ID:  Elizabeth Hebert, DOB 1945-12-10, MRN [id number]  PCP:  Mayra Neer, MD  Cardiologist:   Dorris Carnes, MD   The pt presents for follow up of atrial fibrilation    History of Present Illness: Elizabeth Hebert is a 76 y.o. female with a history of Crohn's disease, type 2 diabetes, hypertension, CAD (s/p MI 1999  ANgina was L jaw pain ), obesity, anemia she is followed by Serita Grammes.  Also has a history of breast cancer, GE reflux. In fall 2022 the pt presented with SOB and LE edema    EKG initially not done    Eco showed normal LVEF   The atrial were severely dilated   Follow up Visit an EKG showed atrial fibrillation    She was started on anticoagulation  After I saw her she was set up for DCCV in December    The pt returns for follow up    She says she stillget SOB with activity   She denies CP   Still has some LE edeam      Asking if she noted a difference after the cardioversion, she is not sure if she felt better in SR   She denies palpitaitons  She may have had more energy     Current Meds  Medication Sig   ALPRAZolam (XANAX) 0.25 MG tablet Take 0.25 mg by mouth 2 (two) times daily as needed for anxiety.   apixaban (ELIQUIS) 5 MG TABS tablet Take 5 mg by mouth 2 (two) times daily.   budesonide-formoterol (SYMBICORT) 80-4.5 MCG/ACT inhaler Inhale 2 puffs into the lungs daily.   calcium citrate (CALCITRATE - DOSED IN MG ELEMENTAL CALCIUM) 950 (200 Ca) MG tablet Take 200 mg of elemental calcium by mouth daily.   cimetidine (TAGAMET) 200 MG tablet Take 200 mg by mouth 2 (two) times daily.   digoxin (LANOXIN) 0.125 MG tablet Take 0.125 mg by mouth daily.   ezetimibe (ZETIA) 10 MG tablet Take 10 mg by mouth daily.   ferrous sulfate 325 (65 FE) MG tablet Take 325 mg by mouth daily with breakfast.   furosemide (LASIX) 40 MG tablet Take 1 tablet (40 mg total) by mouth daily.   ibandronate (BONIVA) 150 MG tablet Take 150 mg by  mouth every 30 (thirty) days.    ipratropium (ATROVENT) 0.06 % nasal spray Place 2 sprays into both nostrils 3 (three) times daily.   metFORMIN (GLUCOPHAGE) 1000 MG tablet Take 1,000 mg by mouth 2 (two) times daily with a meal.   metoprolol succinate (TOPROL-XL) 100 MG 24 hr tablet Take 100 mg by mouth 2 (two) times daily. Take with or immediately following a meal.   potassium chloride SA (KLOR-CON M) 20 MEQ tablet Take 1 tablet (20 mEq total) by mouth 2 (two) times daily.   rosuvastatin (CRESTOR) 20 MG tablet Take 20 mg by mouth daily.   sertraline (ZOLOFT) 100 MG tablet Take 100 mg by mouth daily.   valsartan-hydrochlorothiazide (DIOVAN-HCT) 320-25 MG per tablet Take 1 tablet by mouth daily.   XULTOPHY 100-3.6 UNIT-MG/ML SOPN Inject 50 Units into the skin daily.     Allergies:   Clonidine and Vasotec [enalaprilat]   Past Medical History:  Diagnosis Date   Anemia    Anginal pain ([place])    infreq   Arthritis    Breast cancer ([place]) 12/08/2012   left lumpectomy   Broken arm 2011   Cancer ([place])  breaast   Colitis    Depression    Diabetes mellitus without complication ([place])    Diverticulitis    Fibroid tumor    GERD (gastroesophageal reflux disease)    H/O hiatal hernia    Heart murmur    History of cataract surgery 2012   Hx of hernia repair    Hx of radiation therapy 01/26/13- 02/18/13   left breast 4250 cGy 17 sessions   Hypertension    Myocardial infarction ([place])    stents x 2 when had mi 99   Personal history of radiation therapy 12/08/2012    Past Surgical History:  Procedure Laterality Date   APPENDECTOMY     BREAST LUMPECTOMY Left 12/08/2012   left lumpectomy   CARDIOVERSION N/A 05/08/2021   Procedure: CARDIOVERSION;  Surgeon: Donato Heinz, MD;  Location: [facility];  Service: Cardiovascular;  Laterality: N/A;   CHOLECYSTECTOMY     COLON RESECTION     EYE SURGERY Bilateral    cat    HERNIA REPAIR     rt   MASTECTOMY, PARTIAL Left 12/25/2012    Procedure: MASTECTOMY PARTIAL with re-excision of margins;  Surgeon: Adin Hector, MD;  Location: [facility];  Service: General;  Laterality: Left;   PARTIAL MASTECTOMY WITH NEEDLE LOCALIZATION AND AXILLARY SENTINEL LYMPH NODE BX Left 12/08/2012   Procedure: LEFT PARTIAL MASTECTOMY WITH NEEDLE LOCALIZATION AND AXILLARY SENTINEL LYMPH NODE BIOPSIES TIMES FOUR;  Surgeon: Adin Hector, MD;  Location: [facility];  Service: General;  Laterality: Left;   TOTAL ABDOMINAL HYSTERECTOMY  2001     Social History:  The patient  reports that she quit smoking about 24 years ago. Her smoking use included cigarettes. She has a 30.00 pack-year smoking history. She has never used smokeless tobacco. She reports current alcohol use. She reports that she does not use drugs.   Family History:  The patient's family history includes Breast cancer in her daughter and paternal grandmother; Breast cancer (age of onset: 64) in her maternal grandmother; Cancer in her cousin; Cervical cancer (age of onset: 69) in her paternal grandmother; Pancreatic cancer in her maternal aunt.    ROS:  Please see the history of present illness. All other systems are reviewed and  Negative to the above problem except as noted.    PHYSICAL EXAM: VS:  BP 136/80   Pulse 74   Ht '5\' 1"'$  (1.549 m)   Wt 175 lb (79.4 kg)   SpO2 96%   BMI 33.07 kg/m   GEN: Well nourished, well developed, in no acute distress  HEENT: normal  Neck: no JVD, carotid bruits, Cardiac   Irreg irreg   no S3  No significant murmurs    1-2+ LE edema   Respiratory:  clear to auscultation bilaterally   GI: soft, nontender, nondistended, + BS  No hepatomegaly  MS: no deformity Moving all extremities   Skin: warm and dry, no rash Neuro:  Strength and sensation are intact Psych: euthymic mood, full affect   EKG:  EKG is ordered today.    Atrial fibrillation with occsaoinal PVC   77 bpm   Sl ST deprresion II, III, AVF, V3 to V6   T wave inversion V4,  V5 Lipid Panel No results found for: "CHOL", "TRIG", "HDL", "CHOLHDL", "VLDL", "LDLCALC", "LDLDIRECT"    Wt Readings from Last 3 Encounters:  11/14/21 175 lb (79.4 kg)  05/08/21 165 lb (74.8 kg)  04/12/21 174 lb 12.8 oz (79.3 kg)  ASSESSMENT AND PLAN: 1.  Atrial fibrillation. First documented last fall  S/P cardioversion   in Dec 2022  Now back in afib.     She is not clear if she felt better, had less SOB after the last cardioversion     I would recomm getting an echo since prevous was done at an outside institution.    I would also set up for a Lexiscan myovie to r/o ischemia    If negative, consider cardioverison.     2  Diastolic CHF    Neck veins are flat but ankles are swollen   Will get labs   Keep on same meds    With lasix she says she urinates a lot    Not sure that it helped much wit hswelling     2  HTN   BP is fair    3 history of CAD.  ANgina was jaw pain   She is not having any of that but question if SOB is anginal equivalent    Will set up for lexiscan myovuew  4  Dyslipidemia.  Excellent control in September    LDL 36  HDL 45     F/U and plans will be based on test results     Current medicines are reviewed at length with the patient today.  The patient does not have concerns regarding medicines.  Signed, Dorris Carnes, MD  11/14/2021 10:02 PM    [facility] [place], [place], [place]  [address] Phone: [phone]; Fax: [phone]

## 2021-11-14 ENCOUNTER — Ambulatory Visit: Payer: Medicare Other | Admitting: Internal Medicine

## 2021-11-14 ENCOUNTER — Encounter: Payer: Self-pay | Admitting: Internal Medicine

## 2021-11-14 VITALS — BP 136/80 | HR 74 | Ht 61.0 in | Wt 175.0 lb

## 2021-11-14 DIAGNOSIS — Z79899 Other long term (current) drug therapy: Secondary | ICD-10-CM

## 2021-11-14 DIAGNOSIS — I2510 Atherosclerotic heart disease of native coronary artery without angina pectoris: Secondary | ICD-10-CM

## 2021-11-14 DIAGNOSIS — R0602 Shortness of breath: Secondary | ICD-10-CM | POA: Diagnosis not present

## 2021-11-14 DIAGNOSIS — I10 Essential (primary) hypertension: Secondary | ICD-10-CM | POA: Diagnosis not present

## 2021-11-14 NOTE — Patient Instructions (Addendum)
Medication Instructions:  Your physician recommends that you continue on your current medications as directed. Please refer to the Current Medication list given to you today.  *If you need a refill on your cardiac medications before your next appointment, please call your pharmacy*   Lab Work: BMET, PRO BNP, TSH  If you have labs (blood work) drawn today and your tests are completely normal, you will receive your results only by: [place] (if you have MyChart) OR A paper copy in the mail If you have any lab test that is abnormal or we need to change your treatment, we will call you to review the results.   Testing/Procedures: Your physician has requested that you have a lexiscan myoview. For further information please visit [email or web address]. Please follow instruction sheet, as given.  Your physician has requested that you have an echocardiogram. Echocardiography is a painless test that uses sound waves to create images of your heart. It provides your doctor with information about the size and shape of your heart and how well your heart's chambers and valves are working. This procedure takes approximately one hour. There are no restrictions for this procedure.     Follow-Up: At [facility], you and your health needs are our priority.  As part of our continuing mission to provide you with exceptional heart care, we have created designated Provider Care Teams.  These Care Teams include your primary Cardiologist (physician) and Advanced Practice Providers (APPs -  Physician Assistants and Nurse Practitioners) who all work together to provide you with the care you need, when you need it.  We recommend signing up for the patient portal called "MyChart".  Sign up information is provided on this After Visit Summary.  MyChart is used to connect with patients for Virtual Visits (Telemedicine).  Patients are able to view lab/test results, encounter notes, upcoming appointments, etc.   Non-urgent messages can be sent to your provider as well.   To learn more about what you can do with MyChart, go to [email or web address].    Your next appointment:   3 month(s)  The format for your next appointment:   In Person  DR. PAULA ROSS   If primary card or EP is not listed click here to update    :1}    Other Instructions   Important Information About Sugar

## 2021-11-15 LAB — TSH: TSH: 3.26 u[IU]/mL (ref 0.450–4.500)

## 2021-11-15 LAB — BASIC METABOLIC PANEL WITH GFR
BUN/Creatinine Ratio: 11 — ABNORMAL LOW (ref 12–28)
BUN: 11 mg/dL (ref 8–27)
CO2: 30 mmol/L — ABNORMAL HIGH (ref 20–29)
Calcium: 10.2 mg/dL (ref 8.7–10.3)
Chloride: 101 mmol/L (ref 96–106)
Creatinine, Ser: 1 mg/dL (ref 0.57–1.00)
Glucose: 93 mg/dL (ref 70–99)
Potassium: 4.6 mmol/L (ref 3.5–5.2)
Sodium: 144 mmol/L (ref 134–144)
eGFR: 58 mL/min/1.73 — ABNORMAL LOW

## 2021-11-15 LAB — PRO B NATRIURETIC PEPTIDE: NT-Pro BNP: 2611 pg/mL — ABNORMAL HIGH (ref 0–738)

## 2021-11-19 ENCOUNTER — Encounter: Payer: Self-pay | Admitting: Internal Medicine

## 2021-11-19 DIAGNOSIS — Z01810 Encounter for preprocedural cardiovascular examination: Secondary | ICD-10-CM

## 2021-11-19 MED ORDER — FUROSEMIDE 40 MG PO TABS: 60.0000 mg | ORAL_TABLET | Freq: Every day | ORAL | 6 refills | Status: DC

## 2021-11-19 NOTE — Telephone Encounter (Signed)
Spoke with the pt and she will have labs 11/28/21.   Echo and Stress test 12/05/21. Advised pt if she could not make it in next week for labs we can do at her visit for her testing but she says she is fine coming in nest week.

## 2021-11-19 NOTE — Telephone Encounter (Signed)
-----   Message from Fay Records, MD sent at 11/16/2021  3:37 PM EDT ----- Reviewed with pateint   Kidney funciton normal FLuid is up Recomm:   Increase furosemide to 60 mg daily   Keep on KCL as doing  Set up for BMET in 10 days    Plan to get echo and stress test   Then, if no signficant abnormal findings , will plan for DCCV with close follow up after to confirm improvement in symtoms before starting antiarrhythmic

## 2021-11-19 NOTE — Telephone Encounter (Signed)
Spoke to patient    Discussed cardioversion, after testing first.   Will plan d/c cardioversion after with close follow up of symptoms.

## 2021-11-28 ENCOUNTER — Other Ambulatory Visit: Payer: Medicare Other

## 2021-11-28 ENCOUNTER — Telehealth (HOSPITAL_COMMUNITY): Payer: Self-pay | Admitting: *Deleted

## 2021-11-28 ENCOUNTER — Encounter (HOSPITAL_COMMUNITY): Payer: Self-pay | Admitting: *Deleted

## 2021-11-28 DIAGNOSIS — R0602 Shortness of breath: Secondary | ICD-10-CM

## 2021-11-28 DIAGNOSIS — Z79899 Other long term (current) drug therapy: Secondary | ICD-10-CM

## 2021-11-28 NOTE — Telephone Encounter (Signed)
Left message on voicemail in reference to upcoming appointment scheduled for 12/05/21 Phone number given for a call back so details instructions can be given.  Letter with instructions sent to my chart.  Kirstie Peri

## 2021-11-29 LAB — BASIC METABOLIC PANEL
BUN/Creatinine Ratio: 15 (ref 12–28)
BUN: 13 mg/dL (ref 8–27)
CO2: 26 mmol/L (ref 20–29)
Calcium: 9.6 mg/dL (ref 8.7–10.3)
Chloride: 99 mmol/L (ref 96–106)
Creatinine, Ser: 0.84 mg/dL (ref 0.57–1.00)
Glucose: 86 mg/dL (ref 70–99)
Potassium: 3.8 mmol/L (ref 3.5–5.2)
Sodium: 140 mmol/L (ref 134–144)
eGFR: 72 mL/min/{1.73_m2} (ref >59)

## 2021-11-29 NOTE — Telephone Encounter (Signed)
Lm and sent My Chart message to the pt for her Cardioversion scheduled for 12/12/21.

## 2021-11-30 ENCOUNTER — Encounter: Payer: Self-pay | Admitting: Internal Medicine

## 2021-11-30 NOTE — Telephone Encounter (Signed)
Called pt in regards to my chart message. Advised that DCCV is scheduled for 12/12/21.  Pt reports Lelon Frohlich, RN reviewed instructions.  Mailed out instruction letter as pt did not recall time of procedure.  No further concerns voiced.

## 2021-12-05 ENCOUNTER — Ambulatory Visit (HOSPITAL_COMMUNITY): Payer: Medicare Other | Attending: Cardiology

## 2021-12-05 ENCOUNTER — Other Ambulatory Visit: Payer: Medicare Other | Admitting: *Deleted

## 2021-12-05 ENCOUNTER — Ambulatory Visit (HOSPITAL_BASED_OUTPATIENT_CLINIC_OR_DEPARTMENT_OTHER): Payer: Medicare Other

## 2021-12-05 DIAGNOSIS — Z79899 Other long term (current) drug therapy: Secondary | ICD-10-CM

## 2021-12-05 DIAGNOSIS — I2510 Atherosclerotic heart disease of native coronary artery without angina pectoris: Secondary | ICD-10-CM

## 2021-12-05 DIAGNOSIS — I10 Essential (primary) hypertension: Secondary | ICD-10-CM

## 2021-12-05 DIAGNOSIS — R0602 Shortness of breath: Secondary | ICD-10-CM

## 2021-12-05 DIAGNOSIS — Z01810 Encounter for preprocedural cardiovascular examination: Secondary | ICD-10-CM

## 2021-12-05 LAB — ECHOCARDIOGRAM COMPLETE
AR max vel: 1.21 cm2
AV Area VTI: 1.34 cm2
AV Area mean vel: 1.24 cm2
AV Mean grad: 12 mmHg
AV Peak grad: 18.8 mmHg
Ao pk vel: 2.17 m/s
Area-P 1/2: 3.62 cm2
Height: 61 in
P 1/2 time: 1488 msec
S' Lateral: 2.4 cm
Weight: 2800 oz

## 2021-12-05 LAB — MYOCARDIAL PERFUSION IMAGING
LV dias vol: 73 mL (ref 46–106)
LV sys vol: 20 mL
Nuc Stress EF: 72 %
Peak HR: 76 {beats}/min
Rest HR: 73 {beats}/min
Rest Nuclear Isotope Dose: 10.9 mCi
SDS: 5
SRS: 5
SSS: 10
ST Depression (mm): 0 mm
Stress Nuclear Isotope Dose: 32.4 mCi
TID: 1

## 2021-12-05 LAB — CBC
Hematocrit: 42.3 % (ref 34.0–46.6)
Hemoglobin: 13.8 g/dL (ref 11.1–15.9)
MCH: 27.8 pg (ref 26.6–33.0)
MCHC: 32.6 g/dL (ref 31.5–35.7)
MCV: 85 fL (ref 79–97)
Platelets: 259 10*3/uL (ref 150–450)
RBC: 4.96 x10E6/uL (ref 3.77–5.28)
RDW: 17.4 % — ABNORMAL HIGH (ref 11.7–15.4)
WBC: 8.1 10*3/uL (ref 3.4–10.8)

## 2021-12-05 MED ORDER — TECHNETIUM TC 99M TETROFOSMIN IV KIT
10.9000 | PACK | Freq: Once | INTRAVENOUS | Status: AC | PRN
  Administered 2021-12-05: 10.9 via INTRAVENOUS

## 2021-12-05 MED ORDER — REGADENOSON 0.4 MG/5ML IV SOLN
0.4000 mg | Freq: Once | INTRAVENOUS | Status: AC
  Administered 2021-12-05: 0.4 mg via INTRAVENOUS

## 2021-12-05 MED ORDER — TECHNETIUM TC 99M TETROFOSMIN IV KIT
32.4000 | PACK | Freq: Once | INTRAVENOUS | Status: AC | PRN
  Administered 2021-12-05: 32.4 via INTRAVENOUS

## 2021-12-06 ENCOUNTER — Encounter (HOSPITAL_COMMUNITY): Payer: Self-pay | Admitting: Cardiology

## 2021-12-09 NOTE — H&P (Signed)
Cardiology Admission History and Physical:   Patient ID: [name] MRN: [id number]; DOB: 1946-01-20   Admission date: (Not on file)  PCP:  Mayra Neer, MD   [facility] Providers Cardiologist:  Harrington Challenger Chief Complaint:    Patient Profile:   Elizabeth Hebert is a 76 y.o. female with Atrial fibrillation  who was seen for continued evaluation of atrial fibrillatoin f History of Present Illness:   Elizabeth Hebert is a 76 y.o. female with a history of Crohn's disease, type 2 diabetes, hypertension, CAD (s/p MI 1999  ANgina was L jaw pain ), obesity, anemia she is followed by Serita Grammes.  Also has a history of breast cancer, GE reflux. In fall 2022 the pt presented with SOB and LE edema    EKG initially not done    Eco showed normal LVEF   The atrial were severely dilated   Follow up Visit an EKG showed atrial fibrillation    She was started on anticoagulation   After I saw her she was set up for DCCV in December    She got cardioverted to SR.   Was not seen again until June 2023     At that clinic visit she complained of SOB with activity   Some LE edema   No CP     Initially when asked if she felt better soon after cardioversion she could not think there was a difference  On reflection with her daughter, the feel that yest there was a difference in how she felt   After that clinic visit I have her set up for a Lexiscan myovue to r/o ischemia   This showed a small inferior inferct vs soft tissue attenuation   NO ischemia   LVEF normal Echo was also done that showed LVEF and RVEF were normal   Mild AS  The pt is set up for cardioversion on July 12       Past Medical History:  Diagnosis Date   Anemia    Anginal pain ([place])    infreq   Arthritis    Breast cancer ([place]) 12/08/2012   left lumpectomy   Broken arm 2011   Cancer ([place])    breaast   Colitis    Depression    Diabetes mellitus without complication ([place])    Diverticulitis    Fibroid tumor    GERD  (gastroesophageal reflux disease)    H/O hiatal hernia    Heart murmur    History of cataract surgery 2012   Hx of hernia repair    Hx of radiation therapy 01/26/13- 02/18/13   left breast 4250 cGy 17 sessions   Hypertension    Myocardial infarction ([place])    stents x 2 when had mi 99   Personal history of radiation therapy 12/08/2012    Past Surgical History:  Procedure Laterality Date   APPENDECTOMY     BREAST LUMPECTOMY Left 12/08/2012   left lumpectomy   CARDIOVERSION N/A 05/08/2021   Procedure: CARDIOVERSION;  Surgeon: Donato Heinz, MD;  Location: [facility];  Service: Cardiovascular;  Laterality: N/A;   CHOLECYSTECTOMY     COLON RESECTION     EYE SURGERY Bilateral    cat    HERNIA REPAIR     rt   MASTECTOMY, PARTIAL Left 12/25/2012   Procedure: MASTECTOMY PARTIAL with re-excision of margins;  Surgeon: Adin Hector, MD;  Location: [facility];  Service: General;  Laterality: Left;   PARTIAL MASTECTOMY WITH  NEEDLE LOCALIZATION AND AXILLARY SENTINEL LYMPH NODE BX Left 12/08/2012   Procedure: LEFT PARTIAL MASTECTOMY WITH NEEDLE LOCALIZATION AND AXILLARY SENTINEL LYMPH NODE BIOPSIES TIMES FOUR;  Surgeon: Adin Hector, MD;  Location: [facility];  Service: General;  Laterality: Left;   TOTAL ABDOMINAL HYSTERECTOMY  2001     Medications Prior to Admission: Prior to Admission medications   Medication Sig Start Date End Date Taking? Authorizing Provider  ALPRAZolam (XANAX) 0.25 MG tablet Take 0.25 mg by mouth 2 (two) times daily as needed for anxiety. 02/01/21  Yes [provider]  apixaban (ELIQUIS) 5 MG TABS tablet Take 5 mg by mouth 2 (two) times daily.   Yes [provider]  budesonide-formoterol (SYMBICORT) 80-4.5 MCG/ACT inhaler Inhale 2 puffs into the lungs daily.   Yes [provider]  calcium citrate (CALCITRATE - DOSED IN MG ELEMENTAL CALCIUM) 950 (200 Ca) MG tablet Take 200 mg of elemental calcium by mouth daily.   Yes  [provider]  cetirizine (ZYRTEC) 10 MG tablet Take 10 mg by mouth daily as needed for allergies.   Yes [provider]  cimetidine (TAGAMET) 200 MG tablet Take 200 mg by mouth 2 (two) times daily.   Yes [provider]  digoxin (LANOXIN) 0.125 MG tablet Take 0.125 mg by mouth daily.   Yes [provider]  ezetimibe (ZETIA) 10 MG tablet Take 10 mg by mouth daily.   Yes [provider]  ferrous sulfate 325 (65 FE) MG tablet Take 325 mg by mouth daily with breakfast.   Yes [provider]  furosemide (LASIX) 40 MG tablet Take 1.5 tablets (60 mg total) by mouth daily. 11/19/21  Yes Fay Records, MD  ipratropium (ATROVENT) 0.06 % nasal spray Place 2 sprays into both nostrils 3 (three) times daily.   Yes [provider]  metFORMIN (GLUCOPHAGE) 1000 MG tablet Take 1,000 mg by mouth 2 (two) times daily with a meal.   Yes [provider]  metoprolol succinate (TOPROL-XL) 100 MG 24 hr tablet Take 100 mg by mouth 2 (two) times daily. Take with or immediately following a meal.   Yes [provider]  potassium chloride SA (KLOR-CON M) 20 MEQ tablet Take 1 tablet (20 mEq total) by mouth 2 (two) times daily. 05/10/21  Yes Fay Records, MD  rosuvastatin (CRESTOR) 20 MG tablet Take 20 mg by mouth daily.   Yes [provider]  sertraline (ZOLOFT) 100 MG tablet Take 100 mg by mouth daily.   Yes [provider]  valsartan-hydrochlorothiazide (DIOVAN-HCT) 320-25 MG per tablet Take 1 tablet by mouth daily. 01/28/15  Yes [provider]  XULTOPHY 100-3.6 UNIT-MG/ML SOPN Inject 50 Units into the skin daily. 03/12/21  Yes [provider]     Allergies:    Allergies  Allergen Reactions   Clonidine Other (See Comments)    Dry mouth   Vasotec [Enalaprilat] Rash    Social History:   Social History   Socioeconomic History   Marital status: Widowed    Spouse name: Not on file   Number of children:  2   Years of education: Not on file   Highest education level: Not on file  Occupational History   Not on file  Tobacco Use   Smoking status: Former    Packs/day: 1.00    Years: 30.00    Total pack years: 30.00    Types: Cigarettes    Quit date: 06/03/1997    Years since quitting: 24.5  Smokeless tobacco: Never  Substance and Sexual Activity   Alcohol use: Yes    Alcohol/week: 0.0 - 1.0 standard drinks of alcohol   Drug use: No   Sexual activity: Not Currently  Other Topics Concern   Not on file  Social History Narrative   Not on file   Social Determinants of Health   Financial Resource Strain: Not on file  Food Insecurity: Not on file  Transportation Needs: Not on file  Physical Activity: Not on file  Stress: Not on file  Social Connections: Not on file  Intimate Partner Violence: Not on file    Family History:   The patient's family history includes Breast cancer in her daughter and paternal grandmother; Breast cancer (age of onset: 101) in her maternal grandmother; Cancer in her cousin; Cervical cancer (age of onset: 50) in her paternal grandmother; Pancreatic cancer in her maternal aunt.    ROS:  Please see the history of present illness.  All other ROS reviewed and negative.     Physical Exam/Data:  There were no vitals filed for this visit. No intake or output data in the 24 hours ending 12/09/21 1806    12/05/2021    7:08 AM 11/14/2021    8:49 AM 05/08/2021   11:00 AM  Last 3 Weights  Weight (lbs) 175 lb 175 lb 165 lb  Weight (kg) 79.379 kg 79.379 kg 74.844 kg     Exam on November 14, 2021   General:  Well nourished, well developed, in no acute distress HEENT: normal Neck: no JVD Vascular: No carotid bruits; Distal pulses 2+ bilaterally   Cardiac:  Irreg irreg  No S3   Lungs:  clear to auscultation bilaterally, no wheezing, rhonchi or rales Abd: soft, nontender, no hepatomegaly  Ext: no edema Musculoskeletal:  No deformities, BUE and BLE strength normal and  equal Skin: warm and dry  Neuro:  CNs 2-12 intact, no focal abnormalities noted Psych:  Normal affect    EKG:  The ECG that was done  was personally reviewed and demonstrated Atrial fibrillation 77 bpm  Occsaoinal PVC   Sl ST Depression with T wave inversion laterall   Relevant CV Studies:  Echo   12/05/21  Left ventricular ejection fraction, by estimation, is 60 to 65%. The left ventricle has normal function. The left ventricle has no regional wall motion abnormalities. There is moderate concentric left ventricular hypertrophy. Left ventricular diastolic parameters are indeterminate. 1. Right ventricular systolic function is normal. The right ventricular size is normal. There is mildly elevated pulmonary artery systolic pressure. The estimated right ventricular systolic pressure is 82.9 mmHg. 2. 3. Left atrial size was severely dilated. The mitral valve is abnormal. Mild mitral valve regurgitation. No evidence of mitral stenosis. Moderate to severe mitral annular calcification. 4. The aortic valve is tricuspid. There is severe calcifcation of the aortic valve. Aortic valve regurgitation is mild. Mild to moderate aortic valve stenosis. Aortic valve area, by VTI measures 1.34 cm. Aortic valve mean gradient measures 12.0 mmHg. 5. The inferior vena cava is normal in size with greater than 50% respiratory variability, suggesting right atrial pressure of 3 mmHg. 6. 7. The patient was in atrial fibrillation. Myoview   12/05/21     Findings are consistent with prior myocardial infarction. The study is low risk.   No ST deviation was noted.   LV perfusion is abnormal. Defect 1: There is a small defect with severe reduction in uptake present in the apical to mid inferolateral location(s) that  is fixed. There is abnormal wall motion in the defect area. Consistent with infarction.   Left ventricular function is normal. Nuclear stress EF: 72 %. The left ventricular ejection fraction is  hyperdynamic (>65%). End diastolic cavity size is normal.   Prior study not available for comparison.  Laboratory Data:  High Sensitivity Troponin:  No results for input(s): "TROPONINIHS" in the last 720 hours.    ChemistryNo results for input(s): "NA", "K", "CL", "CO2", "GLUCOSE", "BUN", "CREATININE", "CALCIUM", "MG", "GFRNONAA", "GFRAA", "ANIONGAP" in the last 168 hours.  No results for input(s): "PROT", "ALBUMIN", "AST", "ALT", "ALKPHOS", "BILITOT" in the last 168 hours. Lipids No results for input(s): "CHOL", "TRIG", "HDL", "LABVLDL", "LDLCALC", "CHOLHDL" in the last 168 hours. Hematology Recent Labs  Lab 12/05/21 1013  WBC 8.1  RBC 4.96  HGB 13.8  HCT 42.3  MCV 85  MCH 27.8  MCHC 32.6  RDW 17.4*  PLT 259   Thyroid No results for input(s): "TSH", "FREET4" in the last 168 hours. BNPNo results for input(s): "BNP", "PROBNP" in the last 168 hours.  DDimer No results for input(s): "DDIMER" in the last 168 hours.   Radiology/Studies:  No results found.   Assessment and Plan:   Atrial fibrillatoin   Pt seen for the first time in fall 2022  Cardioverted in Dec 2022   Unfortunately did not have follow up until July but says she is still symptomatic Echo normal   Myoview is without ischemia    I would recomm repeat cardioversoin and then close tracking on how she feels     Will be done July 12  Keep on same meds   Pt has not missed any anticoagulant    For questions or updates, please contact [place] Please consult [email or web address] for contact info under     Signed, Dorris Carnes, MD  12/09/2021 6:06 PM

## 2021-12-12 ENCOUNTER — Ambulatory Visit (HOSPITAL_COMMUNITY): Payer: Medicare Other | Admitting: Certified Registered"

## 2021-12-12 ENCOUNTER — Other Ambulatory Visit: Payer: Self-pay

## 2021-12-12 ENCOUNTER — Encounter (HOSPITAL_COMMUNITY)
Admission: RE | Disposition: A | Discharge status: Home / Self Care | Payer: Self-pay | Source: Home / Self Care | Attending: Cardiology

## 2021-12-12 ENCOUNTER — Ambulatory Visit (HOSPITAL_COMMUNITY)
Admission: RE | Admit: 2021-12-12 | Discharge: 2021-12-12 | Disposition: A | Discharge status: Home / Self Care | Payer: Medicare Other | Attending: Cardiology | Admitting: Cardiology

## 2021-12-12 ENCOUNTER — Encounter (HOSPITAL_COMMUNITY): Payer: Self-pay | Admitting: Cardiology

## 2021-12-12 ENCOUNTER — Ambulatory Visit (HOSPITAL_BASED_OUTPATIENT_CLINIC_OR_DEPARTMENT_OTHER): Payer: Medicare Other | Admitting: Certified Registered"

## 2021-12-12 DIAGNOSIS — E119 Type 2 diabetes mellitus without complications: Secondary | ICD-10-CM | POA: Diagnosis not present

## 2021-12-12 DIAGNOSIS — Z955 Presence of coronary angioplasty implant and graft: Secondary | ICD-10-CM | POA: Insufficient documentation

## 2021-12-12 DIAGNOSIS — Z01810 Encounter for preprocedural cardiovascular examination: Secondary | ICD-10-CM

## 2021-12-12 DIAGNOSIS — I252 Old myocardial infarction: Secondary | ICD-10-CM | POA: Diagnosis not present

## 2021-12-12 DIAGNOSIS — I4891 Unspecified atrial fibrillation: Secondary | ICD-10-CM

## 2021-12-12 DIAGNOSIS — I4819 Other persistent atrial fibrillation: Secondary | ICD-10-CM

## 2021-12-12 DIAGNOSIS — Z7984 Long term (current) use of oral hypoglycemic drugs: Secondary | ICD-10-CM | POA: Insufficient documentation

## 2021-12-12 DIAGNOSIS — Z87891 Personal history of nicotine dependence: Secondary | ICD-10-CM

## 2021-12-12 DIAGNOSIS — I10 Essential (primary) hypertension: Secondary | ICD-10-CM | POA: Diagnosis not present

## 2021-12-12 DIAGNOSIS — K219 Gastro-esophageal reflux disease without esophagitis: Secondary | ICD-10-CM | POA: Insufficient documentation

## 2021-12-12 DIAGNOSIS — M1990 Unspecified osteoarthritis, unspecified site: Secondary | ICD-10-CM | POA: Diagnosis not present

## 2021-12-12 DIAGNOSIS — Z79899 Other long term (current) drug therapy: Secondary | ICD-10-CM | POA: Insufficient documentation

## 2021-12-12 DIAGNOSIS — I2510 Atherosclerotic heart disease of native coronary artery without angina pectoris: Secondary | ICD-10-CM | POA: Insufficient documentation

## 2021-12-12 DIAGNOSIS — F32A Depression, unspecified: Secondary | ICD-10-CM | POA: Insufficient documentation

## 2021-12-12 DIAGNOSIS — I491 Atrial premature depolarization: Secondary | ICD-10-CM | POA: Diagnosis not present

## 2021-12-12 DIAGNOSIS — I25119 Atherosclerotic heart disease of native coronary artery with unspecified angina pectoris: Secondary | ICD-10-CM | POA: Diagnosis not present

## 2021-12-12 DIAGNOSIS — I080 Rheumatic disorders of both mitral and aortic valves: Secondary | ICD-10-CM | POA: Insufficient documentation

## 2021-12-12 HISTORY — PX: CARDIOVERSION: SHX1299

## 2021-12-12 SURGERY — CARDIOVERSION
Anesthesia: General

## 2021-12-12 MED ORDER — SODIUM CHLORIDE 0.9 % IV SOLN: INTRAVENOUS | Status: DC

## 2021-12-12 MED ORDER — PROPOFOL 10 MG/ML IV BOLUS
INTRAVENOUS | Status: DC | PRN
  Administered 2021-12-12: 50 mg via INTRAVENOUS

## 2021-12-12 MED ORDER — LIDOCAINE 2% (20 MG/ML) 5 ML SYRINGE
INTRAMUSCULAR | Status: DC | PRN
  Administered 2021-12-12: 60 mg via INTRAVENOUS

## 2021-12-12 NOTE — Discharge Instructions (Signed)

## 2021-12-12 NOTE — Transfer of Care (Signed)
Immediate Anesthesia Transfer of Care Note  Patient: Elizabeth Hebert  Procedure(s) Performed: CARDIOVERSION  Patient Location: PACU and Endoscopy Unit  Anesthesia Type:General  Level of Consciousness: awake, alert  and oriented  Airway & Oxygen Therapy: Patient Spontanous Breathing  Post-op Assessment: Report given to RN and Post -op Vital signs reviewed and stable  Post vital signs: Reviewed and stable  Last Vitals:  Vitals Value Taken Time  BP 148/66 12/12/21 1157  Temp    Pulse 59 12/12/21 1159  Resp 19 12/12/21 1159  SpO2 95 % 12/12/21 1159  Vitals shown include unvalidated device data.  Last Pain:  Vitals:   12/12/21 1016  TempSrc: Temporal  PainSc: 0-No pain         Complications: No notable events documented.

## 2021-12-12 NOTE — CV Procedure (Signed)
Procedure: Electrical Cardioversion Indications:  Atrial Fibrillation  Procedure Details:  Consent: Risks of procedure as well as the alternatives and risks of each were explained to the (patient/caregiver).  Consent for procedure obtained.  Time Out: Verified patient identification, verified procedure, site/side was marked, verified correct patient position, special equipment/implants available, medications/allergies/relevent history reviewed, required imaging and test results available. PERFORMED.  Patient placed on cardiac monitor, pulse oximetry, supplemental oxygen as necessary.  Sedation given:  Propofol '50mg'$ ; lidocaine '40mg'$  Pacer pads placed anterior and posterior chest.  Cardioverted 1 time(s).  Cardioversion with synchronized biphasic 150J shock.  Evaluation: Findings: Post procedure EKG shows: NSR Complications: None Patient did tolerate procedure well.  Time Spent Directly with the Patient:  31mnutes   HFreada Bergeron7/05/2022, 12:20 PM

## 2021-12-12 NOTE — Interval H&P Note (Signed)
History and Physical Interval Note:  12/12/2021 11:25 AM  Elizabeth Hebert  has presented today for surgery, with the diagnosis of AFIB.  The various methods of treatment have been discussed with the patient and family. After consideration of risks, benefits and other options for treatment, the patient has consented to  Procedure(s): CARDIOVERSION (N/A) as a surgical intervention.  The patient's history has been reviewed, patient examined, no change in status, stable for surgery.  I have reviewed the patient's chart and labs.  Questions were answered to the patient's satisfaction.     [name]

## 2021-12-12 NOTE — Anesthesia Procedure Notes (Signed)
Procedure Name: General with mask airway Date/Time: 12/12/2021 11:53 AM  Performed by: Anastasio Auerbach, CRNAPre-anesthesia Checklist: Patient identified, Emergency Drugs available, Suction available, Patient being monitored and Timeout performed Oxygen Delivery Method: Ambu bag Preoxygenation: Pre-oxygenation with 100% oxygen Induction Type: IV induction Ventilation: Mask ventilation without difficulty

## 2021-12-12 NOTE — Anesthesia Preprocedure Evaluation (Addendum)
Anesthesia Evaluation  Patient identified by MRN, date of birth, ID band Patient awake    Reviewed: Allergy & Precautions, NPO status , Patient's Chart, lab work & pertinent test results, reviewed documented beta blocker date and time   Airway Mallampati: II  TM Distance: >3 FB Neck ROM: Full    Dental  (+) Dental Advisory Given, Chipped,    Pulmonary neg pulmonary ROS, former smoker,    Pulmonary exam normal breath sounds clear to auscultation       Cardiovascular hypertension, Pt. on home beta blockers and Pt. on medications + angina + CAD, + Past MI and + Cardiac Stents  Normal cardiovascular exam+ dysrhythmias (on eliquis) Atrial Fibrillation + Valvular Problems/Murmurs (mild/mod) AS  Rhythm:Irregular Rate:Normal  TTE 2023 1. Left ventricular ejection fraction, by estimation, is 60 to 65%. The  left ventricle has normal function. The left ventricle has no regional  wall motion abnormalities. There is moderate concentric left ventricular  hypertrophy. Left ventricular  diastolic parameters are indeterminate.  2. Right ventricular systolic function is normal. The right ventricular  size is normal. There is mildly elevated pulmonary artery systolic  pressure. The estimated right ventricular systolic pressure is 93.7 mmHg.  3. Left atrial size was severely dilated.  4. The mitral valve is abnormal. Mild mitral valve regurgitation. No  evidence of mitral stenosis. Moderate to severe mitral annular  calcification.  5. The aortic valve is tricuspid. There is severe calcifcation of the  aortic valve. Aortic valve regurgitation is mild. Mild to moderate aortic  valve stenosis. Aortic valve area, by VTI measures 1.34 cm. Aortic valve  mean gradient measures 12.0 mmHg.  6. The inferior vena cava is normal in size with greater than 50%  respiratory variability, suggesting right atrial pressure of 3 mmHg.  7. The patient was in  atrial fibrillation.    Neuro/Psych PSYCHIATRIC DISORDERS Depression negative neurological ROS     GI/Hepatic Neg liver ROS, hiatal hernia, GERD  ,  Endo/Other  diabetes, Type 2, Oral Hypoglycemic Agents  Renal/GU negative Renal ROS  negative genitourinary   Musculoskeletal  (+) Arthritis ,   Abdominal   Peds  Hematology negative hematology ROS (+)   Anesthesia Other Findings   Reproductive/Obstetrics                            Anesthesia Physical Anesthesia Plan  ASA: 3  Anesthesia Plan: General   Post-op Pain Management:    Induction: Intravenous  PONV Risk Score and Plan: 3 and Propofol infusion and Treatment may vary due to age or medical condition  Airway Management Planned: Natural Airway  Additional Equipment:   Intra-op Plan:   Post-operative Plan:   Informed Consent: I have reviewed the patients History and Physical, chart, labs and discussed the procedure including the risks, benefits and alternatives for the proposed anesthesia with the patient or authorized representative who has indicated his/her understanding and acceptance.     Dental advisory given  Plan Discussed with: CRNA  Anesthesia Plan Comments:         Anesthesia Quick Evaluation

## 2021-12-13 ENCOUNTER — Encounter (HOSPITAL_COMMUNITY): Payer: Self-pay | Admitting: Cardiology

## 2021-12-13 NOTE — Anesthesia Postprocedure Evaluation (Signed)
Anesthesia Post Note  Patient: Elizabeth Hebert  Procedure(s) Performed: CARDIOVERSION     Patient location during evaluation: Endoscopy Anesthesia Type: General Level of consciousness: awake and alert Pain management: pain level controlled Vital Signs Assessment: post-procedure vital signs reviewed and stable Respiratory status: spontaneous breathing, nonlabored ventilation, respiratory function stable and patient connected to nasal cannula oxygen Cardiovascular status: blood pressure returned to baseline and stable Postop Assessment: no apparent nausea or vomiting Anesthetic complications: no   No notable events documented.  Last Vitals:  Vitals:   12/12/21 1230 12/12/21 1231  BP: 126/64 138/72  Pulse: (!) 59 (!) 59  Resp: 10 10  Temp:    SpO2: 93% 93%    Last Pain:  Vitals:   12/12/21 1231  TempSrc:   PainSc: 0-No pain                 Alyze Lauf L Taneya Conkel

## 2022-01-07 ENCOUNTER — Ambulatory Visit: Payer: Medicare Other | Admitting: Internal Medicine

## 2022-01-07 ENCOUNTER — Encounter: Payer: Self-pay | Admitting: Internal Medicine

## 2022-01-07 VITALS — BP 130/68 | HR 62 | Ht 61.0 in | Wt 178.8 lb

## 2022-01-07 DIAGNOSIS — I2510 Atherosclerotic heart disease of native coronary artery without angina pectoris: Secondary | ICD-10-CM | POA: Diagnosis not present

## 2022-01-07 DIAGNOSIS — Z79899 Other long term (current) drug therapy: Secondary | ICD-10-CM

## 2022-01-07 NOTE — Patient Instructions (Signed)
Medication Instructions:   *If you need a refill on your cardiac medications before your next appointment, please call your pharmacy*   Lab Work:  If you have labs (blood work) drawn today and your tests are completely normal, you will receive your results only by: [place] (if you have MyChart) OR A paper copy in the mail If you have any lab test that is abnormal or we need to change your treatment, we will call you to review the results.   Testing/Procedures:    Follow-Up: At [facility], you and your health needs are our priority.  As part of our continuing mission to provide you with exceptional heart care, we have created designated Provider Care Teams.  These Care Teams include your primary Cardiologist (physician) and Advanced Practice Providers (APPs -  Physician Assistants and Nurse Practitioners) who all work together to provide you with the care you need, when you need it.  We recommend signing up for the patient portal called "MyChart".  Sign up information is provided on this After Visit Summary.  MyChart is used to connect with patients for Virtual Visits (Telemedicine).  Patients are able to view lab/test results, encounter notes, upcoming appointments, etc.  Non-urgent messages can be sent to your provider as well.   To learn more about what you can do with MyChart, go to [email or web address].     Other Instructions   Important Information About Sugar

## 2022-01-07 NOTE — Progress Notes (Signed)
Cardiology Office Note   Date:  01/07/2022   ID:  Elizabeth Hebert, DOB December 31, 1945, MRN [id number]  PCP:  Mayra Neer, MD  Cardiologist:   Dorris Carnes, MD   The pt presents for follow up of atrial fibrilation    History of Present Illness: Elizabeth Hebert is a 76 y.o. female with a history of Crohn's disease, type 2 diabetes, hypertension, CAD (s/p MI 1999  ANgina was L jaw pain ), obesity, anemia.  Also has a history of breast cancer, GE reflux. In fall 2022 the pt presented with SOB and LE edema    EKG initially not done    Echo showed normal LVEF   The atrial were severely dilated   Follow up Visit an EKG showed atrial fibrillation    She was started on anticoagulation.  SHe underwent DCCV in Dec 2022.   I saw her then in [place]    Initially she felt there was no difference but then on reflection she said she felt better initially, then got more SOB       The pt went on to have a myoview to r/o ischemia as cause of dyspnea   This was negative for ischemia   Echo showed normal LVEF/RVEF   Mild AS    The pt was set up for a repeat cardioversion   She had this done on 7/15   She says she felt better after, had less SOB, could do more The pt says that recently she has noticed more DOE  Denies CP  NO palpitatoins    Current Meds  Medication Sig   ALPRAZolam (XANAX) 0.25 MG tablet Take 0.25 mg by mouth 2 (two) times daily as needed for anxiety.   apixaban (ELIQUIS) 5 MG TABS tablet Take 5 mg by mouth 2 (two) times daily.   budesonide-formoterol (SYMBICORT) 80-4.5 MCG/ACT inhaler Inhale 2 puffs into the lungs daily.   calcium citrate (CALCITRATE - DOSED IN MG ELEMENTAL CALCIUM) 950 (200 Ca) MG tablet Take 200 mg of elemental calcium by mouth daily.   cetirizine (ZYRTEC) 10 MG tablet Take 10 mg by mouth daily as needed for allergies.   cimetidine (TAGAMET) 200 MG tablet Take 200 mg by mouth 2 (two) times daily.   digoxin (LANOXIN) 0.125 MG tablet Take 0.125 mg by mouth daily.    ezetimibe (ZETIA) 10 MG tablet Take 10 mg by mouth daily.   ferrous sulfate 325 (65 FE) MG tablet Take 325 mg by mouth daily with breakfast.   furosemide (LASIX) 40 MG tablet Take 1.5 tablets (60 mg total) by mouth daily.   ipratropium (ATROVENT) 0.06 % nasal spray Place 2 sprays into both nostrils 3 (three) times daily.   metFORMIN (GLUCOPHAGE) 1000 MG tablet Take 1,000 mg by mouth 2 (two) times daily with a meal.   metoprolol succinate (TOPROL-XL) 100 MG 24 hr tablet Take 100 mg by mouth 2 (two) times daily. Take with or immediately following a meal.   potassium chloride SA (KLOR-CON M) 20 MEQ tablet Take 1 tablet (20 mEq total) by mouth 2 (two) times daily.   rosuvastatin (CRESTOR) 20 MG tablet Take 20 mg by mouth daily.   sertraline (ZOLOFT) 100 MG tablet Take 100 mg by mouth daily.   valsartan-hydrochlorothiazide (DIOVAN-HCT) 320-25 MG per tablet Take 1 tablet by mouth daily.   XULTOPHY 100-3.6 UNIT-MG/ML SOPN Inject 50 Units into the skin daily.     Allergies:   Clonidine and Vasotec [enalaprilat]   Past Medical History:  Diagnosis Date   Anemia    Anginal pain ([name])    infreq   Arthritis    Breast cancer ([place]) 12/08/2012   left lumpectomy   Broken arm 2011   Cancer ([place])    breaast   Colitis    Depression    Diabetes mellitus without complication ([place])    Diverticulitis    Fibroid tumor    GERD (gastroesophageal reflux disease)    H/O hiatal hernia    Heart murmur    History of cataract surgery 2012   Hx of hernia repair    Hx of radiation therapy 01/26/13- 02/18/13   left breast 4250 cGy 17 sessions   Hypertension    Myocardial infarction ([place])    stents x 2 when had mi 99   Personal history of radiation therapy 12/08/2012    Past Surgical History:  Procedure Laterality Date   APPENDECTOMY     BREAST LUMPECTOMY Left 12/08/2012   left lumpectomy   CARDIOVERSION N/A 05/08/2021   Procedure: CARDIOVERSION;  Surgeon: Donato Heinz, MD;  Location: [place];  Service: Cardiovascular;  Laterality: N/A;   CARDIOVERSION N/A 12/12/2021   Procedure: CARDIOVERSION;  Surgeon: [name], MD;  Location: [place];  Service: Cardiovascular;  Laterality: N/A;   CHOLECYSTECTOMY     COLON RESECTION     EYE SURGERY Bilateral    cat    HERNIA REPAIR     rt   MASTECTOMY, PARTIAL Left 12/25/2012   Procedure: MASTECTOMY PARTIAL with re-excision of margins;  Surgeon: Adin Hector, MD;  Location: [place];  Service: General;  Laterality: Left;   PARTIAL MASTECTOMY WITH NEEDLE LOCALIZATION AND AXILLARY SENTINEL LYMPH NODE BX Left 12/08/2012   Procedure: LEFT PARTIAL MASTECTOMY WITH NEEDLE LOCALIZATION AND AXILLARY SENTINEL LYMPH NODE BIOPSIES TIMES FOUR;  Surgeon: Adin Hector, MD;  Location: [place];  Service: General;  Laterality: Left;   TOTAL ABDOMINAL HYSTERECTOMY  2001     Social History:  The patient  reports that she quit smoking about 24 years ago. Her smoking use included cigarettes. She has a 30.00 pack-year smoking history. She has never used smokeless tobacco. She reports current alcohol use. She reports that she does not use drugs.   Family History:  The patient's family history includes Breast cancer in her daughter and paternal grandmother; Breast cancer (age of onset: 2) in her maternal grandmother; Cancer in her cousin; Cervical cancer (age of onset: 8) in her paternal grandmother; Pancreatic cancer in her maternal aunt.    ROS:  Please see the history of present illness. All other systems are reviewed and  Negative to the above problem except as noted.    PHYSICAL EXAM: VS:  BP 130/68   Pulse 62   Ht '5\' 1"'$  (1.549 m)   Wt 178 lb 12.8 oz (81.1 kg)   SpO2 92%   BMI 33.78 kg/m   GEN: Well nourished, well developed, in no acute distress  HEENT: normal  Neck: no JVD, \no bruits  Cardiac   Irreg irreg   no S3  No significant murmurs    1-2+ LE edema   Respiratory:  clear to auscultation  bilaterally   GI: soft, nontender, nondistended, + BS  No hepatomegaly  MS: no deformity Moving all extremities   Skin: warm and dry, no rash Neuro:  Strength and sensation are intact Psych: euthymic mood, full affect   EKG:  EKG is ordered today.    Atrial fibrillation with  occsaoinal PVC   62  bpm   Sl ST deprresion II, III, AVF, V3 to V6   T wave inversion V4, V5 Lipid Panel No results found for: "CHOL", "TRIG", "HDL", "CHOLHDL", "VLDL", "LDLCALC", "LDLDIRECT"    Wt Readings from Last 3 Encounters:  01/07/22 178 lb 12.8 oz (81.1 kg)  12/12/21 175 lb 0.7 oz (79.4 kg)  12/05/21 175 lb (79.4 kg)      ASSESSMENT AND PLAN: 1.  Atrial fibrillation Pt just underwent cardioversion back in July   She is back in afib now   Denies palpitations   She does say taht she felt good initially and now may be a little more SOB with activity  Would recomm Stopping digoxin.   Start multaq 400 bid    Take with food on stomach    In 2 wks if still in afbi then would set up for cardioversion again       2  Diastolic CHF    Continues to have LE edema   Did not take lasix today    Continue current dosing for now       2  HTN   BP is good   follow     3 history of CAD.  Recent myoview showed no ischemia     4  Dyslipidemia.  Excellent control in September    LDL 36  HDL 45       Current medicines are reviewed at length with the patient today.  The patient does not have concerns regarding medicines.  Signed, Dorris Carnes, MD  01/07/2022 3:14 PM    [facility] [address], [place], [place]  [address] Phone: [phone]; Fax: [phone]

## 2022-01-08 ENCOUNTER — Telehealth: Payer: Self-pay

## 2022-01-08 MED ORDER — DRONEDARONE HCL 400 MG PO TABS: 400.0000 mg | ORAL_TABLET | Freq: Two times a day (BID) | ORAL | 3 refills | Status: DC

## 2022-01-08 NOTE — Telephone Encounter (Signed)
Pt advised and will come in for nurse visit with me 01/23/22 for an EKG.

## 2022-01-08 NOTE — Telephone Encounter (Signed)
-----   Message from [place], MD sent at 01/07/2022  9:55 PM EDT ----- REcomm 1  Stop Digoxin 2  Start Multaq 400 bid with food    3  Follow up EKG in 2  wks   If still in afib then cardiovert on this.

## 2022-01-16 ENCOUNTER — Telehealth: Payer: Self-pay | Admitting: Internal Medicine

## 2022-01-16 ENCOUNTER — Other Ambulatory Visit: Payer: Self-pay

## 2022-01-16 ENCOUNTER — Emergency Department (HOSPITAL_BASED_OUTPATIENT_CLINIC_OR_DEPARTMENT_OTHER): Payer: Medicare Other | Admitting: Radiology

## 2022-01-16 ENCOUNTER — Emergency Department (HOSPITAL_BASED_OUTPATIENT_CLINIC_OR_DEPARTMENT_OTHER)
Admission: EM | Admit: 2022-01-16 | Discharge: 2022-01-16 | Disposition: A | Discharge status: Home / Self Care | Payer: Medicare Other | Attending: Emergency Medicine | Admitting: Emergency Medicine

## 2022-01-16 ENCOUNTER — Encounter (HOSPITAL_BASED_OUTPATIENT_CLINIC_OR_DEPARTMENT_OTHER): Payer: Self-pay

## 2022-01-16 DIAGNOSIS — Z7901 Long term (current) use of anticoagulants: Secondary | ICD-10-CM | POA: Diagnosis not present

## 2022-01-16 DIAGNOSIS — R0602 Shortness of breath: Secondary | ICD-10-CM | POA: Diagnosis not present

## 2022-01-16 DIAGNOSIS — Z20822 Contact with and (suspected) exposure to covid-19: Secondary | ICD-10-CM | POA: Insufficient documentation

## 2022-01-16 DIAGNOSIS — R600 Localized edema: Secondary | ICD-10-CM | POA: Diagnosis not present

## 2022-01-16 DIAGNOSIS — I509 Heart failure, unspecified: Secondary | ICD-10-CM | POA: Insufficient documentation

## 2022-01-16 LAB — CBC
HCT: 39.5 % (ref 36.0–46.0)
Hemoglobin: 12.6 g/dL (ref 12.0–15.0)
MCH: 27.8 pg (ref 26.0–34.0)
MCHC: 31.9 g/dL (ref 30.0–36.0)
MCV: 87 fL (ref 80.0–100.0)
Platelets: 219 10*3/uL (ref 150–400)
RBC: 4.54 MIL/uL (ref 3.87–5.11)
RDW: 16.5 % — ABNORMAL HIGH (ref 11.5–15.5)
WBC: 8.7 10*3/uL (ref 4.0–10.5)
nRBC: 0 % (ref 0.0–0.2)

## 2022-01-16 LAB — COMPREHENSIVE METABOLIC PANEL
ALT: 20 U/L (ref 0–44)
AST: 19 U/L (ref 15–41)
Albumin: 4.1 g/dL (ref 3.5–5.0)
Alkaline Phosphatase: 50 U/L (ref 38–126)
Anion gap: 10 (ref 5–15)
BUN: 12 mg/dL (ref 8–23)
CO2: 28 mmol/L (ref 22–32)
Calcium: 9.7 mg/dL (ref 8.9–10.3)
Chloride: 100 mmol/L (ref 98–111)
Creatinine, Ser: 0.9 mg/dL (ref 0.44–1.00)
GFR, Estimated: >60 mL/min (ref >60)
Glucose, Bld: 84 mg/dL (ref 70–99)
Potassium: 4.1 mmol/L (ref 3.5–5.1)
Sodium: 138 mmol/L (ref 135–145)
Total Bilirubin: 1.2 mg/dL (ref 0.3–1.2)
Total Protein: 7.2 g/dL (ref 6.5–8.1)

## 2022-01-16 LAB — TROPONIN I (HIGH SENSITIVITY)
Troponin I (High Sensitivity): 7 ng/L (ref <18)
Troponin I (High Sensitivity): 10 ng/L (ref <18)

## 2022-01-16 LAB — SARS CORONAVIRUS 2 BY RT PCR: SARS Coronavirus 2 by RT PCR: NEGATIVE

## 2022-01-16 LAB — BRAIN NATRIURETIC PEPTIDE: B Natriuretic Peptide: 408.8 pg/mL — ABNORMAL HIGH (ref 0.0–100.0)

## 2022-01-16 MED ORDER — FUROSEMIDE 10 MG/ML IJ SOLN
60.0000 mg | Freq: Once | INTRAMUSCULAR | Status: AC
  Administered 2022-01-16: 60 mg via INTRAVENOUS
  Filled 2022-01-16: qty 6

## 2022-01-16 NOTE — Discharge Instructions (Signed)
Please increase your Lasix to 80 mg in the morning for the next 4 days.  You are expected to urinate frequently.  Please avoid too much salt intake  Please follow-up with your cardiologist as scheduled next week  Please keep a strict record about how much fluids you take in and also your weight.  Return to ER if you have worse shortness of breath, chest pain, leg swelling

## 2022-01-16 NOTE — ED Triage Notes (Signed)
Patient here POV from Home with Family.  Endorses Onset of Worsening SOB that Began Mainly Last PM.  Recent History of Atrial Fibrillation. No New N/V. No Discernable CP.   SOB in Triage. A&Ox4. GCS 15. Ambulatory.

## 2022-01-16 NOTE — Telephone Encounter (Signed)
   Patient c/o Palpitations:  High priority if patient c/o lightheadedness, shortness of breath, or chest pain  How long have you had palpitations/irregular HR/ Afib? Are you having the symptoms now? A few days, she states yesterday  Are you currently experiencing lightheadedness, SOB or CP? SOB yesterday  Do you have a history of afib (atrial fibrillation) or irregular heart rhythm?   Have you checked your BP or HR? (document readings if available):   Are you experiencing any other symptoms? Pt states she is back in afib. She states the medication hasnt made her afib go away. She c/o Sob when standing up and she states it was bad yesterday.

## 2022-01-16 NOTE — ED Notes (Signed)
Pt amb indept with standby assist. Pt maintained O2 above 90%, slightly labored breathing and sts that she still feels SOB, about the same as when she arrived, MD made aware.

## 2022-01-16 NOTE — Telephone Encounter (Signed)
Called patient. She said today she is continuing to feel poorly, like yesterday.  She is fatigued and SOB.  She is wheezing during the entire phone call and has a frequent dry cough which just started yesterday or today.  She has not taken her lasix 60 mg today or yesterday or last week when she came to the office.  She is not sure what other days she has missed of Lasix.  She is at home by herself.  Her daughter is closeby.  I asked her to call her daughter to have her brought to the [facility] ER for further evaluation and treatment.  She is in agreement w this.  [facility] notified.

## 2022-01-16 NOTE — ED Provider Notes (Signed)
[facility] EMERGENCY DEPT Provider Note   CSN: [id number] Arrival date & time: 01/16/22  1539     History  Chief Complaint  Patient presents with   Shortness of Breath    Elizabeth Hebert is a 76 y.o. female history of heart failure, A-fib on Eliquis, here presenting with shortness of breath and leg swelling.  Patient states that she takes 60 mg of Lasix every day.  She did miss her dose yesterday and today.  She noticed worsening leg swelling.  She also has shortness of breath with minimal exertion.  Denies any chest pain.  Patient called cardiology and was noted to appear very short of breath on the phone so patient was sent for evaluation.  The history is provided by the patient.       Home Medications Prior to Admission medications   Medication Sig Start Date End Date Taking? Authorizing Provider  ALPRAZolam (XANAX) 0.25 MG tablet Take 0.25 mg by mouth 2 (two) times daily as needed for anxiety. 02/01/21   [provider]  apixaban (ELIQUIS) 5 MG TABS tablet Take 5 mg by mouth 2 (two) times daily.    [provider]  budesonide-formoterol (SYMBICORT) 80-4.5 MCG/ACT inhaler Inhale 2 puffs into the lungs daily.    [provider]  calcium citrate (CALCITRATE - DOSED IN MG ELEMENTAL CALCIUM) 950 (200 Ca) MG tablet Take 200 mg of elemental calcium by mouth daily.    [provider]  cetirizine (ZYRTEC) 10 MG tablet Take 10 mg by mouth daily as needed for allergies.    [provider]  cimetidine (TAGAMET) 200 MG tablet Take 200 mg by mouth 2 (two) times daily.    [provider]  dronedarone (MULTAQ) 400 MG tablet Take 1 tablet (400 mg total) by mouth 2 (two) times daily with a meal. 01/08/22   Fay Records, MD  ezetimibe (ZETIA) 10 MG tablet Take 10 mg by mouth daily.    [provider]  ferrous sulfate 325 (65 FE) MG tablet Take 325 mg by mouth daily with breakfast.    [provider]  furosemide  (LASIX) 40 MG tablet Take 1.5 tablets (60 mg total) by mouth daily. 11/19/21   Fay Records, MD  ipratropium (ATROVENT) 0.06 % nasal spray Place 2 sprays into both nostrils 3 (three) times daily.    [provider]  metFORMIN (GLUCOPHAGE) 1000 MG tablet Take 1,000 mg by mouth 2 (two) times daily with a meal.    [provider]  metoprolol succinate (TOPROL-XL) 100 MG 24 hr tablet Take 100 mg by mouth 2 (two) times daily. Take with or immediately following a meal.    [provider]  potassium chloride SA (KLOR-CON M) 20 MEQ tablet Take 1 tablet (20 mEq total) by mouth 2 (two) times daily. 05/10/21   Fay Records, MD  rosuvastatin (CRESTOR) 20 MG tablet Take 20 mg by mouth daily.    [provider]  sertraline (ZOLOFT) 100 MG tablet Take 100 mg by mouth daily.    [provider]  valsartan-hydrochlorothiazide (DIOVAN-HCT) 320-25 MG per tablet Take 1 tablet by mouth daily. 01/28/15   [provider]  XULTOPHY 100-3.6 UNIT-MG/ML SOPN Inject 50 Units into the skin daily. 03/12/21   [provider]      Allergies    Clonidine and Vasotec [enalaprilat]    Review of Systems   Review of Systems  Respiratory:  Positive for shortness of breath.   All  other systems reviewed and are negative.   Physical Exam Updated Vital Signs BP (!) 137/96   Pulse (!) 110   Temp 98.5 F (36.9 C) (Oral)   Resp 16   Ht '5\' 1"'$  (1.549 m)   Wt 81.1 kg   SpO2 90%   BMI 33.78 kg/m  Physical Exam Vitals and nursing note reviewed.  Constitutional:      Comments: Slightly tachypneic  HENT:     Head: Normocephalic and atraumatic.     Mouth/Throat:     Mouth: Mucous membranes are moist.  Eyes:     Extraocular Movements: Extraocular movements intact.     Pupils: Pupils are equal, round, and reactive to light.  Cardiovascular:     Rate and Rhythm: Normal rate and regular rhythm.  Pulmonary:     Comments: Crackles bilateral bases Abdominal:      General: Bowel sounds are normal.     Palpations: Abdomen is soft.  Musculoskeletal:     Cervical back: Normal range of motion and neck supple.     Comments: 1+ edema bilaterally  Skin:    General: Skin is warm.     Capillary Refill: Capillary refill takes less than 2 seconds.  Neurological:     General: No focal deficit present.     Mental Status: She is alert and oriented to person, place, and time.  Psychiatric:        Mood and Affect: Mood normal.        Behavior: Behavior normal.     ED Results / Procedures / Treatments   Labs (all labs ordered are listed, but only abnormal results are displayed) Labs Reviewed  CBC - Abnormal; Notable for the following components:      Result Value   RDW 16.5 (*)    All other components within normal limits  SARS CORONAVIRUS 2 BY RT PCR  COMPREHENSIVE METABOLIC PANEL  BRAIN NATRIURETIC PEPTIDE  TROPONIN I (HIGH SENSITIVITY)  TROPONIN I (HIGH SENSITIVITY)    EKG EKG Interpretation  Date/Time:  Wednesday January 16 2022 15:44:13 EDT Ventricular Rate:  77 PR Interval:    QRS Duration: 86 QT Interval:  426 QTC Calculation: 482 R Axis:   79 Text Interpretation: Atrial fibrillation ST & T wave abnormality, consider inferior ischemia Prolonged QT Abnormal ECG When compared with ECG of 12-Dec-2021 12:04, Atrial fibrillation has replaced Sinus rhythm T wave inversion more evident in Inferior leads T wave inversion no longer evident in Anterolateral leads Confirmed by Wandra Arthurs [phone]) on 01/16/2022 5:31:10 PM  Radiology DG Chest 2 View  Result Date: 01/16/2022 CLINICAL DATA:  Shortness of breath. EXAM: CHEST - 2 VIEW COMPARISON:  Oct 15, 2021. FINDINGS: Stable cardiomediastinal silhouette. Increased central pulmonary vascular congestion is noted and minimal bilateral pulmonary edema may be present. Bony thorax is unremarkable. No pneumothorax or pleural effusion is noted. IMPRESSION: Increased central pulmonary vascular congestion is noted  with possible minimal bilateral pulmonary edema. Electronically Signed   By: Marijo Conception M.D.   On: 01/16/2022 16:20    Procedures Procedures    Medications Ordered in ED Medications - No data to display  ED Course/ Medical Decision Making/ A&P                           Medical Decision Making BREAHNA BOYLEN is a 76 y.o. female here presenting with shortness of breath and leg swelling.  Concern for possible COPD versus CHF versus  COVID.  Plan to get CBC and CMP and COVID test and BNP and chest x-ray.  If her creatinine is normal, anticipate that she will need some diuresis.  8:57 PM Reviewed patient's labs and independently interpreted imaging studies.  Troponin negative x2.  BNP is 400.  Chest x-ray showed pulmonary vascular congestion.  Patient's initial oxygen saturation was 89 to 90%.  After 60 mg of IV Lasix, she urinated over a liter.  Patient then was able to ambulate and pulse ox maintained greater than 90%.  Now her resting pulse ox is 94%.  She is feeling better now.  I will increase her Lasix to 80 mg in the morning.  She has cardiology follow-up next week.  Gave strict return precautions.   Problems Addressed: Acute on chronic congestive heart failure, unspecified heart failure type [facility]): acute illness or injury  Amount and/or Complexity of Data Reviewed Labs: ordered. Decision-making details documented in ED Course. Radiology: ordered and independent interpretation performed. Decision-making details documented in ED Course. ECG/medicine tests: ordered and independent interpretation performed. Decision-making details documented in ED Course.  Risk Prescription drug management.    Final Clinical Impression(s) / ED Diagnoses Final diagnoses:  None    Rx / DC Orders ED Discharge Orders     None         Drenda Freeze, MD 01/16/22 [phone]

## 2022-01-23 ENCOUNTER — Encounter: Payer: Self-pay | Admitting: Internal Medicine

## 2022-01-23 ENCOUNTER — Ambulatory Visit: Payer: Medicare Other | Admitting: Internal Medicine

## 2022-01-23 VITALS — BP 102/64 | HR 71 | Ht 60.0 in | Wt 173.0 lb

## 2022-01-23 DIAGNOSIS — I480 Paroxysmal atrial fibrillation: Secondary | ICD-10-CM | POA: Diagnosis not present

## 2022-01-23 NOTE — Progress Notes (Signed)
Cardiology Office Note   Date:  01/23/2022   ID:  Elizabeth, Hebert 12/14/1945, MRN [id number]  PCP:  Mayra Neer, MD  Cardiologist:   Dorris Carnes, MD   The pt presents for follow up of atrial fibrilation    History of Present Illness: Elizabeth Hebert is a 76 y.o. female with a history of Crohn's disease, type 2 diabetes, hypertension, CAD (s/p MI 1999  ANgina was L jaw pain ), obesity, anemia.  Also has a history of breast cancer, GE reflux. In fall 2022 the pt presented with SOB and LE edema    EKG initially not done    Echo showed normal LVEF   The atrial were severely dilated  On follow up visit, EKG showed atrial fibrillation    She was started on anticoagulation.  SHe underwent DCCV in Dec 2022.   I then saw her  in JUne  2023   She said initially se noted no difference after cardioversion, but then said her breathing was better   for awhile   The pt had a lexiscan myoview which showed no ischemia   Echo showed normal LVEF/RVEF   Mild AS      She went on to have repeat cardioversion on 12/15/21   She says she felt better for a bit but then more SOB    When I saw her earlier in [place] she was back in afib   After that visit she was placed on Multaq 400 bid  Sicne seen her breathing got worse last week   She  The pt was set up for a repeat cardioversion   She had this done on 7/15   She says she felt better after, had less SOB, could do more The pt says that recently she has noticed more DOE  She was seen in ER and her lasix increased for a few days    This helped with symptoms    Still she gets SOB as she has in afib   She denies CP   No dizziness  No palpitations     Current Meds  Medication Sig   ALPRAZolam (XANAX) 0.25 MG tablet Take 0.25 mg by mouth 2 (two) times daily as needed for anxiety.   apixaban (ELIQUIS) 5 MG TABS tablet Take 5 mg by mouth 2 (two) times daily.   budesonide-formoterol (SYMBICORT) 80-4.5 MCG/ACT inhaler Inhale 2 puffs into the lungs daily.    calcium citrate (CALCITRATE - DOSED IN MG ELEMENTAL CALCIUM) 950 (200 Ca) MG tablet Take 200 mg of elemental calcium by mouth daily.   cetirizine (ZYRTEC) 10 MG tablet Take 10 mg by mouth daily as needed for allergies.   cimetidine (TAGAMET) 200 MG tablet Take 200 mg by mouth 2 (two) times daily.   dronedarone (MULTAQ) 400 MG tablet Take 1 tablet (400 mg total) by mouth 2 (two) times daily with a meal.   ezetimibe (ZETIA) 10 MG tablet Take 10 mg by mouth daily.   ferrous sulfate 325 (65 FE) MG tablet Take 325 mg by mouth daily with breakfast.   furosemide (LASIX) 40 MG tablet Take 1.5 tablets (60 mg total) by mouth daily.   ipratropium (ATROVENT) 0.06 % nasal spray Place 2 sprays into both nostrils 3 (three) times daily.   metFORMIN (GLUCOPHAGE) 1000 MG tablet Take 1,000 mg by mouth 2 (two) times daily with a meal.   metoprolol succinate (TOPROL-XL) 100 MG 24 hr tablet Take 100 mg by mouth 2 (two) times daily.  Take with or immediately following a meal.   potassium chloride SA (KLOR-CON M) 20 MEQ tablet Take 1 tablet (20 mEq total) by mouth 2 (two) times daily.   rosuvastatin (CRESTOR) 20 MG tablet Take 20 mg by mouth daily.   sertraline (ZOLOFT) 100 MG tablet Take 100 mg by mouth daily.   valsartan-hydrochlorothiazide (DIOVAN-HCT) 320-25 MG per tablet Take 1 tablet by mouth daily.   XULTOPHY 100-3.6 UNIT-MG/ML SOPN Inject 50 Units into the skin daily.     Allergies:   Clonidine and Vasotec [enalaprilat]   Past Medical History:  Diagnosis Date   Anemia    Anginal pain ([place])    infreq   Arthritis    Breast cancer ([place]) 12/08/2012   left lumpectomy   Broken arm 2011   Cancer ([place])    breaast   Colitis    Depression    Diabetes mellitus without complication ([place])    Diverticulitis    Fibroid tumor    GERD (gastroesophageal reflux disease)    H/O hiatal hernia    Heart murmur    History of cataract surgery 2012   Hx of hernia repair    Hx of radiation therapy 01/26/13- 02/18/13    left breast 4250 cGy 17 sessions   Hypertension    Myocardial infarction ([place])    stents x 2 when had mi 99   Personal history of radiation therapy 12/08/2012    Past Surgical History:  Procedure Laterality Date   APPENDECTOMY     BREAST LUMPECTOMY Left 12/08/2012   left lumpectomy   CARDIOVERSION N/A 05/08/2021   Procedure: CARDIOVERSION;  Surgeon: Donato Heinz, MD;  Location: [facility];  Service: Cardiovascular;  Laterality: N/A;   CARDIOVERSION N/A 12/12/2021   Procedure: CARDIOVERSION;  Surgeon: [name], MD;  Location: [facility];  Service: Cardiovascular;  Laterality: N/A;   CHOLECYSTECTOMY     COLON RESECTION     EYE SURGERY Bilateral    cat    HERNIA REPAIR     rt   MASTECTOMY, PARTIAL Left 12/25/2012   Procedure: MASTECTOMY PARTIAL with re-excision of margins;  Surgeon: Adin Hector, MD;  Location: [facility];  Service: General;  Laterality: Left;   PARTIAL MASTECTOMY WITH NEEDLE LOCALIZATION AND AXILLARY SENTINEL LYMPH NODE BX Left 12/08/2012   Procedure: LEFT PARTIAL MASTECTOMY WITH NEEDLE LOCALIZATION AND AXILLARY SENTINEL LYMPH NODE BIOPSIES TIMES FOUR;  Surgeon: Adin Hector, MD;  Location: [facility];  Service: General;  Laterality: Left;   TOTAL ABDOMINAL HYSTERECTOMY  2001     Social History:  The patient  reports that she quit smoking about 24 years ago. Her smoking use included cigarettes. She has a 30.00 pack-year smoking history. She has never used smokeless tobacco. She reports current alcohol use. She reports that she does not use drugs.   Family History:  The patient's family history includes Breast cancer in her daughter and paternal grandmother; Breast cancer (age of onset: 75) in her maternal grandmother; Cancer in her cousin; Cervical cancer (age of onset: 71) in her paternal grandmother; Pancreatic cancer in her maternal aunt.    ROS:  Please see the history of present illness. All other systems are reviewed and   Negative to the above problem except as noted.    PHYSICAL EXAM: VS:  BP 102/64   Pulse 71   Ht 5' (1.524 m)   Wt 173 lb (78.5 kg)   BMI 33.79 kg/m   GEN: Well nourished, well developed, in  no acute distress  HEENT: normal  Neck: no JVD, no bruits  Cardiac   Irreg irreg   no S3  No significant murmurs    1+ LE edema   Respiratory:  clear to auscultation bilaterally   GI: soft, nontender, nondistended, + BS  No hepatomegaly  MS: no deformity Moving all extremities   Skin: warm and dry, no rash Neuro:  Strength and sensation are intact Psych: euthymic mood, full affect   EKG:  EKG is ordered today.    Atrial fibrillation 71 bpm NOnspecific ST changes   Lipid Panel No results found for: "CHOL", "TRIG", "HDL", "CHOLHDL", "VLDL", "LDLCALC", "LDLDIRECT"    Wt Readings from Last 3 Encounters:  01/23/22 173 lb (78.5 kg)  01/16/22 178 lb 12.7 oz (81.1 kg)  01/07/22 178 lb 12.8 oz (81.1 kg)      ASSESSMENT AND PLAN: 1.  Atrial fibrillation Pt remains in afib on Multaq   Will get labs today    Plan for repeat cardioversion, with close follow up to see if Multaq will hold her in SR        2  HFpEF   Volume status is still up some   May be related to afib   Will get labs   Based on results, consider increasing lasix dosing, poss alternate day dosing      2  HTN   BP is well controlled    3 history of CAD.  Remote MI in 1999   Recent myoview showed no ischemia     4  Dyslipidemia.  Excellent control in September    LDL 36  HDL 45       Current medicines are reviewed at length with the patient today.  The patient does not have concerns regarding medicines.  Signed, Dorris Carnes, MD  01/23/2022 11:02 AM    [facility] [address], [place], [place]  [address] Phone: [phone]; Fax: [phone]

## 2022-01-23 NOTE — H&P (View-Only) (Signed)
Cardiology Office Note   Date:  01/23/2022   ID:  Venia, Riveron [birth year], MRN [id number]  PCP:  Mayra Neer, MD  Cardiologist:   Dorris Carnes, MD   The pt presents for follow up of atrial fibrilation    History of Present Illness: Elizabeth Hebert is a 76 y.o. female with a history of Crohn's disease, type 2 diabetes, hypertension, CAD (s/p MI 1999  ANgina was L jaw pain ), obesity, anemia.  Also has a history of breast cancer, GE reflux. In fall 2022 the pt presented with SOB and LE edema    EKG initially not done    Echo showed normal LVEF   The atrial were severely dilated  On follow up visit, EKG showed atrial fibrillation    She was started on anticoagulation.  SHe underwent DCCV in Dec 2022.   I then saw her  in JUne  2023   She said initially se noted no difference after cardioversion, but then said her breathing was better   for awhile   The pt had a lexiscan myoview which showed no ischemia   Echo showed normal LVEF/RVEF   Mild AS      She went on to have repeat cardioversion on 12/15/21   She says she felt better for a bit but then more SOB    When I saw her earlier in [place] she was back in afib   After that visit she was placed on Multaq 400 bid  Sicne seen her breathing got worse last week   She  The pt was set up for a repeat cardioversion   She had this done on 7/15   She says she felt better after, had less SOB, could do more The pt says that recently she has noticed more DOE  She was seen in ER and her lasix increased for a few days    This helped with symptoms    Still she gets SOB as she has in afib   She denies CP   No dizziness  No palpitations     Current Meds  Medication Sig   ALPRAZolam (XANAX) 0.25 MG tablet Take 0.25 mg by mouth 2 (two) times daily as needed for anxiety.   apixaban (ELIQUIS) 5 MG TABS tablet Take 5 mg by mouth 2 (two) times daily.   budesonide-formoterol (SYMBICORT) 80-4.5 MCG/ACT inhaler Inhale 2 puffs into the lungs daily.    calcium citrate (CALCITRATE - DOSED IN MG ELEMENTAL CALCIUM) 950 (200 Ca) MG tablet Take 200 mg of elemental calcium by mouth daily.   cetirizine (ZYRTEC) 10 MG tablet Take 10 mg by mouth daily as needed for allergies.   cimetidine (TAGAMET) 200 MG tablet Take 200 mg by mouth 2 (two) times daily.   dronedarone (MULTAQ) 400 MG tablet Take 1 tablet (400 mg total) by mouth 2 (two) times daily with a meal.   ezetimibe (ZETIA) 10 MG tablet Take 10 mg by mouth daily.   ferrous sulfate 325 (65 FE) MG tablet Take 325 mg by mouth daily with breakfast.   furosemide (LASIX) 40 MG tablet Take 1.5 tablets (60 mg total) by mouth daily.   ipratropium (ATROVENT) 0.06 % nasal spray Place 2 sprays into both nostrils 3 (three) times daily.   metFORMIN (GLUCOPHAGE) 1000 MG tablet Take 1,000 mg by mouth 2 (two) times daily with a meal.   metoprolol succinate (TOPROL-XL) 100 MG 24 hr tablet Take 100 mg by mouth 2 (two) times daily.  Take with or immediately following a meal.   potassium chloride SA (KLOR-CON M) 20 MEQ tablet Take 1 tablet (20 mEq total) by mouth 2 (two) times daily.   rosuvastatin (CRESTOR) 20 MG tablet Take 20 mg by mouth daily.   sertraline (ZOLOFT) 100 MG tablet Take 100 mg by mouth daily.   valsartan-hydrochlorothiazide (DIOVAN-HCT) 320-25 MG per tablet Take 1 tablet by mouth daily.   XULTOPHY 100-3.6 UNIT-MG/ML SOPN Inject 50 Units into the skin daily.     Allergies:   Clonidine and Vasotec [enalaprilat]   Past Medical History:  Diagnosis Date   Anemia    Anginal pain ([place])    infreq   Arthritis    Breast cancer ([place]) 12/08/2012   left lumpectomy   Broken arm 2011   Cancer ([place])    breaast   Colitis    Depression    Diabetes mellitus without complication ([place])    Diverticulitis    Fibroid tumor    GERD (gastroesophageal reflux disease)    H/O hiatal hernia    Heart murmur    History of cataract surgery 2012   Hx of hernia repair    Hx of radiation therapy 01/26/13- 02/18/13    left breast 4250 cGy 17 sessions   Hypertension    Myocardial infarction ([place])    stents x 2 when had mi 99   Personal history of radiation therapy 12/08/2012    Past Surgical History:  Procedure Laterality Date   APPENDECTOMY     BREAST LUMPECTOMY Left 12/08/2012   left lumpectomy   CARDIOVERSION N/A 05/08/2021   Procedure: CARDIOVERSION;  Surgeon: Donato Heinz, MD;  Location: [facility];  Service: Cardiovascular;  Laterality: N/A;   CARDIOVERSION N/A 12/12/2021   Procedure: CARDIOVERSION;  Surgeon: [name], MD;  Location: [facility];  Service: Cardiovascular;  Laterality: N/A;   CHOLECYSTECTOMY     COLON RESECTION     EYE SURGERY Bilateral    cat    HERNIA REPAIR     rt   MASTECTOMY, PARTIAL Left 12/25/2012   Procedure: MASTECTOMY PARTIAL with re-excision of margins;  Surgeon: Adin Hector, MD;  Location: [facility];  Service: General;  Laterality: Left;   PARTIAL MASTECTOMY WITH NEEDLE LOCALIZATION AND AXILLARY SENTINEL LYMPH NODE BX Left 12/08/2012   Procedure: LEFT PARTIAL MASTECTOMY WITH NEEDLE LOCALIZATION AND AXILLARY SENTINEL LYMPH NODE BIOPSIES TIMES FOUR;  Surgeon: Adin Hector, MD;  Location: [facility];  Service: General;  Laterality: Left;   TOTAL ABDOMINAL HYSTERECTOMY  2001     Social History:  The patient  reports that she quit smoking about 24 years ago. Her smoking use included cigarettes. She has a 30.00 pack-year smoking history. She has never used smokeless tobacco. She reports current alcohol use. She reports that she does not use drugs.   Family History:  The patient's family history includes Breast cancer in her daughter and paternal grandmother; Breast cancer (age of onset: 73) in her maternal grandmother; Cancer in her cousin; Cervical cancer (age of onset: 65) in her paternal grandmother; Pancreatic cancer in her maternal aunt.    ROS:  Please see the history of present illness. All other systems are reviewed and   Negative to the above problem except as noted.    PHYSICAL EXAM: VS:  BP 102/64   Pulse 71   Ht 5' (1.524 m)   Wt 173 lb (78.5 kg)   BMI 33.79 kg/m   GEN: Well nourished, well developed, in  no acute distress  HEENT: normal  Neck: no JVD, no bruits  Cardiac   Irreg irreg   no S3  No significant murmurs    1+ LE edema   Respiratory:  clear to auscultation bilaterally   GI: soft, nontender, nondistended, + BS  No hepatomegaly  MS: no deformity Moving all extremities   Skin: warm and dry, no rash Neuro:  Strength and sensation are intact Psych: euthymic mood, full affect   EKG:  EKG is ordered today.    Atrial fibrillation 71 bpm NOnspecific ST changes   Lipid Panel No results found for: "CHOL", "TRIG", "HDL", "CHOLHDL", "VLDL", "LDLCALC", "LDLDIRECT"    Wt Readings from Last 3 Encounters:  01/23/22 173 lb (78.5 kg)  01/16/22 178 lb 12.7 oz (81.1 kg)  01/07/22 178 lb 12.8 oz (81.1 kg)      ASSESSMENT AND PLAN: 1.  Atrial fibrillation Pt remains in afib on Multaq   Will get labs today    Plan for repeat cardioversion, with close follow up to see if Multaq will hold her in SR        2  HFpEF   Volume status is still up some   May be related to afib   Will get labs   Based on results, consider increasing lasix dosing, poss alternate day dosing      2  HTN   BP is well controlled    3 history of CAD.  Remote MI in 1999   Recent myoview showed no ischemia     4  Dyslipidemia.  Excellent control in September    LDL 36  HDL 45       Current medicines are reviewed at length with the patient today.  The patient does not have concerns regarding medicines.  Signed, Dorris Carnes, MD  01/23/2022 11:02 AM    [facility] [address], [place], [place]  [address] Phone: [phone]; Fax: [phone]

## 2022-01-23 NOTE — Patient Instructions (Signed)
Medication Instructions:   *If you need a refill on your cardiac medications before your next appointment, please call your pharmacy*   Lab Work: CBC, PRO BNP, BMET  If you have labs (blood work) drawn today and your tests are completely normal, you will receive your results only by: [facility] [place] (if you have MyChart) OR A paper copy in the mail If you have any lab test that is abnormal or we need to change your treatment, we will call you to review the results.   Testing/Procedures:     Follow-Up: At [facility], you and your health needs are our priority.  As part of our continuing mission to provide you with exceptional heart care, we have created designated Provider Care Teams.  These Care Teams include your primary Cardiologist (physician) and Advanced Practice Providers (APPs -  Physician Assistants and Nurse Practitioners) who all work together to provide you with the care you need, when you need it.  We recommend signing up for the patient portal called "MyChart".  Sign up information is provided on this After Visit Summary.  MyChart is used to connect with patients for Virtual Visits (Telemedicine).  Patients are able to view lab/test results, encounter notes, upcoming appointments, etc.  Non-urgent messages can be sent to your provider as well.   To learn more about what you can do with MyChart, go to [email or web address].     If primary card or EP is not listed click here to update    :1}    Other Instructions   Important Information About Sugar

## 2022-01-24 LAB — BASIC METABOLIC PANEL
BUN/Creatinine Ratio: 13 (ref 12–28)
BUN: 14 mg/dL (ref 8–27)
CO2: 27 mmol/L (ref 20–29)
Calcium: 9.7 mg/dL (ref 8.7–10.3)
Chloride: 98 mmol/L (ref 96–106)
Creatinine, Ser: 1.04 mg/dL — ABNORMAL HIGH (ref 0.57–1.00)
Glucose: 116 mg/dL — ABNORMAL HIGH (ref 70–99)
Potassium: 3.4 mmol/L — ABNORMAL LOW (ref 3.5–5.2)
Sodium: 139 mmol/L (ref 134–144)
eGFR: 55 mL/min/{1.73_m2} — ABNORMAL LOW (ref >59)

## 2022-01-24 LAB — CBC
Hematocrit: 41.2 % (ref 34.0–46.6)
Hemoglobin: 13.4 g/dL (ref 11.1–15.9)
MCH: 28.3 pg (ref 26.6–33.0)
MCHC: 32.5 g/dL (ref 31.5–35.7)
MCV: 87 fL (ref 79–97)
Platelets: 282 10*3/uL (ref 150–450)
RBC: 4.74 x10E6/uL (ref 3.77–5.28)
RDW: 15.7 % — ABNORMAL HIGH (ref 11.7–15.4)
WBC: 6.5 10*3/uL (ref 3.4–10.8)

## 2022-01-24 LAB — PRO B NATRIURETIC PEPTIDE: NT-Pro BNP: 1508 pg/mL — ABNORMAL HIGH (ref 0–738)

## 2022-01-28 ENCOUNTER — Telehealth: Payer: Self-pay

## 2022-01-28 DIAGNOSIS — E876 Hypokalemia: Secondary | ICD-10-CM

## 2022-01-28 MED ORDER — POTASSIUM CHLORIDE CRYS ER 20 MEQ PO TBCR: 20.0000 meq | EXTENDED_RELEASE_TABLET | Freq: Three times a day (TID) | ORAL | 6 refills | Status: DC

## 2022-01-28 NOTE — Telephone Encounter (Signed)
Pt advised her lab results and will have her Cardioversion with Dr Margaretann Loveless on 02/05/22... pt has follow up with Dr Harrington Challenger 02/11/22 as previously planned.

## 2022-01-28 NOTE — Telephone Encounter (Signed)
-----   Message from Dorris Carnes V, MD sent at 01/24/2022  9:30 PM EDT ----- CBC is OK Fluid is up a little Potassium is slightly low   Increae KCL to 3x per day Stay on other meds  Please set patietn up for cardioversion now that on Multaq

## 2022-01-31 ENCOUNTER — Encounter (HOSPITAL_COMMUNITY): Payer: Self-pay | Admitting: Internal Medicine

## 2022-01-31 NOTE — Progress Notes (Signed)
Attempted to obtain medical history via telephone, unable to reach at this time. HIPAA compliant voicemail message left requesting return call to pre surgical testing department. 

## 2022-02-05 ENCOUNTER — Ambulatory Visit (HOSPITAL_COMMUNITY)
Admission: RE | Admit: 2022-02-05 | Discharge: 2022-02-05 | Disposition: A | Discharge status: Home / Self Care | Payer: Medicare Other | Attending: Internal Medicine | Admitting: Internal Medicine

## 2022-02-05 ENCOUNTER — Ambulatory Visit (HOSPITAL_COMMUNITY): Payer: Medicare Other | Admitting: Anesthesiology

## 2022-02-05 ENCOUNTER — Encounter (HOSPITAL_COMMUNITY)
Admission: RE | Disposition: A | Discharge status: Home / Self Care | Payer: Self-pay | Source: Home / Self Care | Attending: Internal Medicine

## 2022-02-05 ENCOUNTER — Other Ambulatory Visit: Payer: Self-pay

## 2022-02-05 ENCOUNTER — Ambulatory Visit (HOSPITAL_BASED_OUTPATIENT_CLINIC_OR_DEPARTMENT_OTHER): Payer: Medicare Other | Admitting: Anesthesiology

## 2022-02-05 ENCOUNTER — Encounter (HOSPITAL_COMMUNITY): Payer: Self-pay | Admitting: Internal Medicine

## 2022-02-05 DIAGNOSIS — M1990 Unspecified osteoarthritis, unspecified site: Secondary | ICD-10-CM | POA: Insufficient documentation

## 2022-02-05 DIAGNOSIS — Z853 Personal history of malignant neoplasm of breast: Secondary | ICD-10-CM | POA: Diagnosis not present

## 2022-02-05 DIAGNOSIS — K219 Gastro-esophageal reflux disease without esophagitis: Secondary | ICD-10-CM | POA: Insufficient documentation

## 2022-02-05 DIAGNOSIS — Z7984 Long term (current) use of oral hypoglycemic drugs: Secondary | ICD-10-CM | POA: Insufficient documentation

## 2022-02-05 DIAGNOSIS — Z6833 Body mass index (BMI) 33.0-33.9, adult: Secondary | ICD-10-CM | POA: Insufficient documentation

## 2022-02-05 DIAGNOSIS — I10 Essential (primary) hypertension: Secondary | ICD-10-CM

## 2022-02-05 DIAGNOSIS — F32A Depression, unspecified: Secondary | ICD-10-CM | POA: Diagnosis not present

## 2022-02-05 DIAGNOSIS — Z7901 Long term (current) use of anticoagulants: Secondary | ICD-10-CM | POA: Insufficient documentation

## 2022-02-05 DIAGNOSIS — I252 Old myocardial infarction: Secondary | ICD-10-CM

## 2022-02-05 DIAGNOSIS — I110 Hypertensive heart disease with heart failure: Secondary | ICD-10-CM | POA: Insufficient documentation

## 2022-02-05 DIAGNOSIS — Z87891 Personal history of nicotine dependence: Secondary | ICD-10-CM | POA: Diagnosis not present

## 2022-02-05 DIAGNOSIS — K5090 Crohn's disease, unspecified, without complications: Secondary | ICD-10-CM | POA: Insufficient documentation

## 2022-02-05 DIAGNOSIS — E119 Type 2 diabetes mellitus without complications: Secondary | ICD-10-CM | POA: Insufficient documentation

## 2022-02-05 DIAGNOSIS — I2510 Atherosclerotic heart disease of native coronary artery without angina pectoris: Secondary | ICD-10-CM | POA: Insufficient documentation

## 2022-02-05 DIAGNOSIS — I25119 Atherosclerotic heart disease of native coronary artery with unspecified angina pectoris: Secondary | ICD-10-CM | POA: Insufficient documentation

## 2022-02-05 DIAGNOSIS — I4891 Unspecified atrial fibrillation: Secondary | ICD-10-CM | POA: Diagnosis present

## 2022-02-05 DIAGNOSIS — K449 Diaphragmatic hernia without obstruction or gangrene: Secondary | ICD-10-CM | POA: Diagnosis not present

## 2022-02-05 DIAGNOSIS — E669 Obesity, unspecified: Secondary | ICD-10-CM | POA: Diagnosis not present

## 2022-02-05 DIAGNOSIS — I080 Rheumatic disorders of both mitral and aortic valves: Secondary | ICD-10-CM | POA: Insufficient documentation

## 2022-02-05 DIAGNOSIS — Z955 Presence of coronary angioplasty implant and graft: Secondary | ICD-10-CM | POA: Insufficient documentation

## 2022-02-05 DIAGNOSIS — E785 Hyperlipidemia, unspecified: Secondary | ICD-10-CM | POA: Diagnosis not present

## 2022-02-05 DIAGNOSIS — I4819 Other persistent atrial fibrillation: Secondary | ICD-10-CM | POA: Diagnosis not present

## 2022-02-05 HISTORY — PX: CARDIOVERSION: SHX1299

## 2022-02-05 LAB — POCT I-STAT, CHEM 8
BUN: 14 mg/dL (ref 8–23)
Calcium, Ion: 1.14 mmol/L — ABNORMAL LOW (ref 1.15–1.40)
Chloride: 98 mmol/L (ref 98–111)
Creatinine, Ser: 1 mg/dL (ref 0.44–1.00)
Glucose, Bld: 112 mg/dL — ABNORMAL HIGH (ref 70–99)
HCT: 40 % (ref 36.0–46.0)
Hemoglobin: 13.6 g/dL (ref 12.0–15.0)
Potassium: 3.4 mmol/L — ABNORMAL LOW (ref 3.5–5.1)
Sodium: 140 mmol/L (ref 135–145)
TCO2: 34 mmol/L — ABNORMAL HIGH (ref 22–32)

## 2022-02-05 SURGERY — CARDIOVERSION
Anesthesia: General

## 2022-02-05 MED ORDER — LIDOCAINE 2% (20 MG/ML) 5 ML SYRINGE
INTRAMUSCULAR | Status: DC | PRN
  Administered 2022-02-05: 60 mg via INTRAVENOUS

## 2022-02-05 MED ORDER — SODIUM CHLORIDE 0.9 % IV SOLN: INTRAVENOUS | Status: DC

## 2022-02-05 MED ORDER — PROPOFOL 10 MG/ML IV BOLUS
INTRAVENOUS | Status: DC | PRN
  Administered 2022-02-05: 50 mg via INTRAVENOUS

## 2022-02-05 NOTE — Transfer of Care (Signed)
Immediate Anesthesia Transfer of Care Note  Patient: Elizabeth Hebert  Procedure(s) Performed: CARDIOVERSION  Patient Location: Endoscopy Unit  Anesthesia Type:General  Level of Consciousness: drowsy  Airway & Oxygen Therapy: Patient Spontanous Breathing  Post-op Assessment: Report given to RN and Post -op Vital signs reviewed and stable  Post vital signs: Reviewed and stable  Last Vitals:  Vitals Value Taken Time  BP 124/65   Temp    Pulse 59   Resp 21   SpO2 94     Last Pain:  Vitals:   02/05/22 1029  TempSrc: Temporal  PainSc: 0-No pain         Complications: No notable events documented.

## 2022-02-05 NOTE — Anesthesia Procedure Notes (Signed)
Procedure Name: General with mask airway Date/Time: 02/05/2022 11:39 AM  Performed by: Carolan Clines, CRNAPre-anesthesia Checklist: Emergency Drugs available, Patient identified, Suction available, Patient being monitored and Timeout performed Patient Re-evaluated:Patient Re-evaluated prior to induction Oxygen Delivery Method: Ambu bag Preoxygenation: Pre-oxygenation with 100% oxygen Induction Type: IV induction Dental Injury: Teeth and Oropharynx as per pre-operative assessment

## 2022-02-05 NOTE — Discharge Instructions (Signed)

## 2022-02-05 NOTE — Interval H&P Note (Signed)
History and Physical Interval Note:  02/05/2022 11:08 AM  Elizabeth Hebert  has presented today for surgery, with the diagnosis of AFIB.  The various methods of treatment have been discussed with the patient and family. After consideration of risks, benefits and other options for treatment, the patient has consented to  Procedure(s): CARDIOVERSION (N/A) as a surgical intervention.  The patient's history has been reviewed, patient examined, no change in status, stable for surgery.  I have reviewed the patient's chart and labs.  Questions were answered to the patient's satisfaction.     [name]

## 2022-02-05 NOTE — CV Procedure (Signed)
Procedure: Electrical Cardioversion Indications:  Atrial Fibrillation  Procedure Details:  Consent: Risks of procedure as well as the alternatives and risks of each were explained to the (patient/caregiver).  Consent for procedure obtained.  Time Out: Verified patient identification, verified procedure, site/side was marked, verified correct patient position, special equipment/implants available, medications/allergies/relevent history reviewed, required imaging and test results available. PERFORMED.  Patient placed on cardiac monitor, pulse oximetry, supplemental oxygen as necessary.  Sedation given:  propofol per anesthesia Pacer pads placed anterior and posterior chest.  Cardioverted 1 time(s).  Cardioversion with synchronized biphasic 120J shock.  Evaluation: Findings: Post procedure EKG shows: NSR Complications: None Patient did tolerate procedure well.  Time Spent Directly with the Patient:  30 minutes   [name] 02/05/2022, 11:51 AM

## 2022-02-05 NOTE — Anesthesia Preprocedure Evaluation (Addendum)
Anesthesia Evaluation  Patient identified by MRN, date of birth, ID band Patient awake    Reviewed: Allergy & Precautions, NPO status , Patient's Chart, lab work & pertinent test results  Airway Mallampati: II  TM Distance: >3 FB Neck ROM: Full    Dental no notable dental hx. (+) Teeth Intact, Dental Advisory Given   Pulmonary neg pulmonary ROS, former smoker,    Pulmonary exam normal breath sounds clear to auscultation       Cardiovascular hypertension, Pt. on medications + angina + CAD, + Past MI and + Cardiac Stents  Normal cardiovascular exam+ dysrhythmias Atrial Fibrillation + Valvular Problems/Murmurs AS  Rhythm:Irregular Rate:Normal  TTE 2023 1. Left ventricular ejection fraction, by estimation, is 60 to 65%. The  left ventricle has normal function. The left ventricle has no regional  wall motion abnormalities. There is moderate concentric left ventricular  hypertrophy. Left ventricular  diastolic parameters are indeterminate.  2. Right ventricular systolic function is normal. The right ventricular  size is normal. There is mildly elevated pulmonary artery systolic  pressure. The estimated right ventricular systolic pressure is 99.3 mmHg.  3. Left atrial size was severely dilated.  4. The mitral valve is abnormal. Mild mitral valve regurgitation. No  evidence of mitral stenosis. Moderate to severe mitral annular  calcification.  5. The aortic valve is tricuspid. There is severe calcifcation of the  aortic valve. Aortic valve regurgitation is mild. Mild to moderate aortic  valve stenosis. Aortic valve area, by VTI measures 1.34 cm. Aortic valve  mean gradient measures 12.0 mmHg.  6. The inferior vena cava is normal in size with greater than 50%  respiratory variability, suggesting right atrial pressure of 3 mmHg.  7. The patient was in atrial fibrillation.  Stress Test 2023 Findings are consistent with prior  myocardial infarction. The study is low risk. .  No ST deviation was noted. .  LV perfusion is abnormal. Defect 1: There is a small defect with severe reduction in uptake present in the apical to mid inferolateral location(s) that is fixed. There is abnormal wall motion in the defect area. Consistent with infarction. .  Left ventricular function is normal. Nuclear stress EF: 72 %. The left ventricular ejection fraction is hyperdynamic (>65%). End diastolic cavity size is normal. .  Prior study not available for comparison.    Neuro/Psych PSYCHIATRIC DISORDERS Depression negative neurological ROS     GI/Hepatic Neg liver ROS, hiatal hernia, GERD  ,  Endo/Other  diabetes, Type 2, Oral Hypoglycemic Agents  Renal/GU negative Renal ROS  negative genitourinary   Musculoskeletal  (+) Arthritis ,   Abdominal   Peds  Hematology negative hematology ROS (+)   Anesthesia Other Findings   Reproductive/Obstetrics                            Anesthesia Physical Anesthesia Plan  ASA: 3  Anesthesia Plan: General   Post-op Pain Management:    Induction: Intravenous  PONV Risk Score and Plan: 3 and Propofol infusion and Treatment may vary due to age or medical condition  Airway Management Planned: Natural Airway  Additional Equipment:   Intra-op Plan:   Post-operative Plan:   Informed Consent: I have reviewed the patients History and Physical, chart, labs and discussed the procedure including the risks, benefits and alternatives for the proposed anesthesia with the patient or authorized representative who has indicated his/her understanding and acceptance.     Dental advisory given  Plan Discussed with: CRNA  Anesthesia Plan Comments:         Anesthesia Quick Evaluation

## 2022-02-06 ENCOUNTER — Encounter (HOSPITAL_COMMUNITY): Payer: Self-pay | Admitting: Internal Medicine

## 2022-02-06 NOTE — Anesthesia Postprocedure Evaluation (Signed)
Anesthesia Post Note  Patient: Elizabeth Hebert  Procedure(s) Performed: CARDIOVERSION     Patient location during evaluation: Endoscopy Anesthesia Type: General Level of consciousness: awake and alert Pain management: pain level controlled Vital Signs Assessment: post-procedure vital signs reviewed and stable Respiratory status: spontaneous breathing, nonlabored ventilation, respiratory function stable and patient connected to nasal cannula oxygen Cardiovascular status: blood pressure returned to baseline and stable Postop Assessment: no apparent nausea or vomiting Anesthetic complications: no   No notable events documented.  Last Vitals:  Vitals:   02/05/22 1210 02/05/22 1220  BP: 93/60 109/65  Pulse: 63 65  Resp: 14 15  Temp:    SpO2: 98% 96%    Last Pain:  Vitals:   02/05/22 1220  TempSrc:   PainSc: 0-No pain   Pain Goal:                   Aniko Finnigan L Quaneshia Wareing

## 2022-02-10 NOTE — Progress Notes (Unsigned)
Cardiology Office Note   Date:  02/11/2022   ID:  AINARA Hebert, DOB [birth year], MRN [id number]  PCP:  Mayra Neer, MD  Cardiologist:   Dorris Carnes, MD   The pt presents for follow up of atrial fibrilation    History of Present Illness: Elizabeth Hebert is a 76 y.o. female with a history of Crohn's disease, type 2 diabetes, hypertension, CAD (s/p MI 1999; angina was L jaw pain ), obesity, anemia.  Also has a history of breast cancer, GE reflux. In fall 2022 the pt presented with SOB and LE edema    EKG  not done    Echo showed normal LVEF   The atrial were severely dilated  On follow up visit, EKG showed atrial fibrillation    She was started on anticoagulation.  SHe underwent DCCV in Dec 2022.    I then saw the pt in JUne  2023   She said initially she noted no difference after cardioversion,  Then on reflection she said her breathing was better after cardioversion  for awhile   The pt had a lexiscan myoview which showed no ischemia   Echo showed normal LVEF/RVEF   Mild AS      She went on to have repeat cardioversion on 12/15/21   She says she felt better for a bit but then more SOB    When I saw her earlier in AUg 2023  she was back in afib   She was placed on Multaq   400 bid   She was seen in ER for anckle edema and SOBand lasix increased   The pt did not convert on her own with this med   On 02/05/22 she underwent DCCV    Since seen she notes energy and says her breathing a little better  Stll had to pause a couple times coming in from parking structure to catch breath.  She denies PND  No CP  Current Meds  Medication Sig   ALPRAZolam (XANAX) 0.25 MG tablet Take 0.25 mg by mouth 2 (two) times daily as needed for anxiety.   apixaban (ELIQUIS) 5 MG TABS tablet Take 5 mg by mouth 2 (two) times daily.   budesonide-formoterol (SYMBICORT) 80-4.5 MCG/ACT inhaler Inhale 2 puffs into the lungs in the morning.   calcium citrate (CALCITRATE - DOSED IN MG ELEMENTAL CALCIUM) 950 (200  Ca) MG tablet Take 200 mg of elemental calcium by mouth every evening.   cetirizine (ZYRTEC) 10 MG tablet Take 10 mg by mouth daily as needed for allergies.   cimetidine (TAGAMET) 200 MG tablet Take 200 mg by mouth 2 (two) times daily.   dronedarone (MULTAQ) 400 MG tablet Take 1 tablet (400 mg total) by mouth 2 (two) times daily with a meal.   ezetimibe (ZETIA) 10 MG tablet Take 10 mg by mouth in the morning.   ferrous sulfate 325 (65 FE) MG tablet Take 325 mg by mouth every evening.   furosemide (LASIX) 40 MG tablet Take 1.5 tablets (60 mg total) by mouth daily.   ipratropium (ATROVENT) 0.06 % nasal spray Place 2 sprays into both nostrils 3 (three) times daily.   metFORMIN (GLUCOPHAGE) 1000 MG tablet Take 1,000 mg by mouth 2 (two) times daily with a meal.   metoprolol succinate (TOPROL-XL) 100 MG 24 hr tablet Take 100 mg by mouth 2 (two) times daily. Take with or immediately following a meal.   potassium chloride SA (KLOR-CON M) 20 MEQ tablet Take 1 tablet (20  mEq total) by mouth 3 (three) times daily.   rosuvastatin (CRESTOR) 20 MG tablet Take 20 mg by mouth in the morning.   sertraline (ZOLOFT) 100 MG tablet Take 100 mg by mouth in the morning.   valsartan-hydrochlorothiazide (DIOVAN-HCT) 320-25 MG per tablet Take 1 tablet by mouth in the morning.   XULTOPHY 100-3.6 UNIT-MG/ML SOPN Inject 50 Units into the skin in the morning.     Allergies:   Catapres [clonidine] and Vasotec [enalaprilat]   Past Medical History:  Diagnosis Date   Anemia    Anginal pain ([place])    infreq   Arthritis    Breast cancer ([place]) 12/08/2012   left lumpectomy   Broken arm 2011   Cancer ([place])    breaast   Colitis    Depression    Diabetes mellitus without complication ([place])    Diverticulitis    Fibroid tumor    GERD (gastroesophageal reflux disease)    H/O hiatal hernia    Heart murmur    History of cataract surgery 2012   Hx of hernia repair    Hx of radiation therapy 01/26/13- 02/18/13   left breast  4250 cGy 17 sessions   Hypertension    Myocardial infarction ([place])    stents x 2 when had mi 89   Personal history of radiation therapy 12/08/2012    Past Surgical History:  Procedure Laterality Date   APPENDECTOMY     BREAST LUMPECTOMY Left 12/08/2012   left lumpectomy   CARDIOVERSION N/A 05/08/2021   Procedure: CARDIOVERSION;  Surgeon: Donato Heinz, MD;  Location: [facility];  Service: Cardiovascular;  Laterality: N/A;   CARDIOVERSION N/A 12/12/2021   Procedure: CARDIOVERSION;  Surgeon: [name], MD;  Location: [facility];  Service: Cardiovascular;  Laterality: N/A;   CARDIOVERSION N/A 02/05/2022   Procedure: CARDIOVERSION;  Surgeon: [name], MD;  Location: [facility];  Service: Cardiovascular;  Laterality: N/A;   CHOLECYSTECTOMY     COLON RESECTION     EYE SURGERY Bilateral    cat    HERNIA REPAIR     rt   MASTECTOMY, PARTIAL Left 12/25/2012   Procedure: MASTECTOMY PARTIAL with re-excision of margins;  Surgeon: Adin Hector, MD;  Location: [facility];  Service: General;  Laterality: Left;   PARTIAL MASTECTOMY WITH NEEDLE LOCALIZATION AND AXILLARY SENTINEL LYMPH NODE BX Left 12/08/2012   Procedure: LEFT PARTIAL MASTECTOMY WITH NEEDLE LOCALIZATION AND AXILLARY SENTINEL LYMPH NODE BIOPSIES TIMES FOUR;  Surgeon: Adin Hector, MD;  Location: [facility];  Service: General;  Laterality: Left;   TOTAL ABDOMINAL HYSTERECTOMY  2001     Social History:  The patient  reports that she quit smoking about 24 years ago. Her smoking use included cigarettes. She has a 30.00 pack-year smoking history. She has never used smokeless tobacco. She reports current alcohol use. She reports that she does not use drugs.   Family History:  The patient's family history includes Breast cancer in her daughter and paternal grandmother; Breast cancer (age of onset: 60) in her maternal grandmother; Cancer in her cousin; Cervical cancer (age of onset: 21) in her  paternal grandmother; Pancreatic cancer in her maternal aunt.    ROS:  Please see the history of present illness. All other systems are reviewed and  Negative to the above problem except as noted.    PHYSICAL EXAM: VS:  BP 136/60   Pulse 62   Ht 5' (1.524 m)   Wt 174 lb 12.8 oz (  79.3 kg)   SpO2 94%   BMI 34.14 kg/m   MKJ:IZXYO 76 yo, in no acute distress  HEENT: normal  Neck: no JVD, no bruits  Cardiac   Irreg irreg   no S3  No significant murmurs    1+ LE edema  (chronic ) Respiratory:  clear to auscultation bilaterally   GI: soft, nontender, nondistended, + BS  No hepatomegaly  MS: no deformity Moving all extremities   Skin: warm and dry, no rash Neuro:  Strength and sensation are intact Psych: euthymic mood, full affect   EKG:  EKG is ordered today.    SB 57    Nonspecfic ST changes   QTc 480    Lipid Panel No results found for: "CHOL", "TRIG", "HDL", "CHOLHDL", "VLDL", "LDLCALC", "LDLDIRECT"    Wt Readings from Last 3 Encounters:  02/11/22 174 lb 12.8 oz (79.3 kg)  02/05/22 170 lb (77.1 kg)  01/23/22 173 lb (78.5 kg)      ASSESSMENT AND PLAN: 1.  Atrial fibrillation   Remains in SR after cardioversion  on Multaq  Reviewed EKG with EP   will continue    She has had chronic LE edema    I am not convinced of worsening CHF   Follow     2  HFpEF   Volume status is still up some  Will check BMET and BNP    2  HTN   BP is adequately controlled on current regimen     3 history of CAD.  Remote MI in 1999   Recent myoview showed no ischemia     4  Dyslipidemia.  Excellent control in September    LDL 36  HDL 45       Current medicines are reviewed at length with the patient today.  The patient does not have concerns regarding medicines.  Signed, Dorris Carnes, MD  02/11/2022 10:35 AM    [address], [place], [place]  [address] Phone: [phone]; Fax: [phone]

## 2022-02-11 ENCOUNTER — Ambulatory Visit: Payer: Medicare Other | Attending: Internal Medicine | Admitting: Internal Medicine

## 2022-02-11 ENCOUNTER — Encounter: Payer: Self-pay | Admitting: Internal Medicine

## 2022-02-11 ENCOUNTER — Ambulatory Visit (INDEPENDENT_AMBULATORY_CARE_PROVIDER_SITE_OTHER): Payer: Medicare Other

## 2022-02-11 VITALS — BP 136/60 | HR 62 | Ht 60.0 in | Wt 174.8 lb

## 2022-02-11 DIAGNOSIS — I480 Paroxysmal atrial fibrillation: Secondary | ICD-10-CM

## 2022-02-11 DIAGNOSIS — R0602 Shortness of breath: Secondary | ICD-10-CM

## 2022-02-11 DIAGNOSIS — Z79899 Other long term (current) drug therapy: Secondary | ICD-10-CM | POA: Diagnosis not present

## 2022-02-11 NOTE — Progress Notes (Unsigned)
Enrolled for [employer] to mail a ZIO XT long term holter monitor to the patients address on file.  

## 2022-02-11 NOTE — Patient Instructions (Signed)
Medication Instructions:   *If you need a refill on your cardiac medications before your next appointment, please call your pharmacy*   Lab Work: Bmet, pro bnp today  If you have labs (blood work) drawn today and your tests are completely normal, you will receive your results only by: [place] (if you have MyChart) OR A paper copy in the mail If you have any lab test that is abnormal or we need to change your treatment, we will call you to review the results.   Testing/Procedures: [place] [place]- Long Term Monitor Instructions  Your physician has requested you wear a ZIO patch monitor for 7 days.  This is a single patch monitor. [employer] supplies one patch monitor per enrollment. Additional stickers are not available. Please do not apply patch if you will be having a Nuclear Stress Test,  Echocardiogram, Cardiac CT, MRI, or Chest Xray during the period you would be wearing the  monitor. The patch cannot be worn during these tests. You cannot remove and re-apply the  ZIO XT patch monitor.  Your ZIO patch monitor will be mailed 3 day USPS to your address on file. It may take 3-5 days  to receive your monitor after you have been enrolled.  Once you have received your monitor, please review the enclosed instructions. Your monitor  has already been registered assigning a specific monitor serial # to you.  Billing and Patient Assistance Program Information  We have supplied [employer] with any of your insurance information on file for billing purposes. [employer] offers a sliding scale Patient Assistance Program for patients that do not have  insurance, or whose insurance does not completely cover the cost of the ZIO monitor.  You must apply for the Patient Assistance Program to qualify for this discounted rate.  To apply, please call [employer] at [phone], select option 4, select option 2, ask to apply for  Patient Assistance Program. Theodore Demark will ask your household income, and how many people   are in your household. They will quote your out-of-pocket cost based on that information.  [employer] will also be able to set up a 89-month interest-free payment plan if needed.  Applying the monitor   Shave hair from upper left chest.  Hold abrader disc by orange tab. Rub abrader in 40 strokes over the upper left chest as  indicated in your monitor instructions.  Clean area with 4 enclosed alcohol pads. Let dry.  Apply patch as indicated in monitor instructions. Patch will be placed under collarbone on left  side of chest with arrow pointing upward.  Rub patch adhesive wings for 2 minutes. Remove white label marked "1". Remove the white  label marked "2". Rub patch adhesive wings for 2 additional minutes.  While looking in a mirror, press and release button in center of patch. A small green light will  flash 3-4 times. This will be your only indicator that the monitor has been turned on.  Do not shower for the first 24 hours. You may shower after the first 24 hours.  Press the button if you feel a symptom. You will hear a small click. Record Date, Time and  Symptom in the Patient Logbook.  When you are ready to remove the patch, follow instructions on the last 2 pages of Patient  Logbook. Stick patch monitor onto the last page of Patient Logbook.  Place Patient Logbook in the blue and white box. Use locking tab on box and tape box closed  securely. The blue and  white box has prepaid postage on it. Please place it in the mailbox as  soon as possible. Your physician should have your test results approximately 7 days after the  monitor has been mailed back to [facility].  Call [place] at [phone] if you have questions regarding  your ZIO XT patch monitor. Call them immediately if you see an orange light blinking on your  monitor.  If your monitor falls off in less than 4 days, contact our Monitor department at [phone].  If your monitor becomes loose or  falls off after 4 days call [employer] at [phone] for  suggestions on securing your monitor       Follow-Up: At [facility], you and your health needs are our priority.  As part of our continuing mission to provide you with exceptional heart care, we have created designated Provider Care Teams.  These Care Teams include your primary Cardiologist (physician) and Advanced Practice Providers (APPs -  Physician Assistants and Nurse Practitioners) who all work together to provide you with the care you need, when you need it.  We recommend signing up for the patient portal called "MyChart".  Sign up information is provided on this After Visit Summary.  MyChart is used to connect with patients for Virtual Visits (Telemedicine).  Patients are able to view lab/test results, encounter notes, upcoming appointments, etc.  Non-urgent messages can be sent to your provider as well.   To learn more about what you can do with MyChart, go to [email or web address].    Your next appointment:   5 month(s)  The format for your next appointment:   In Person  Provider:   DR Dorris Carnes     Other Instructions   Important Information About Sugar

## 2022-02-12 LAB — BASIC METABOLIC PANEL
BUN/Creatinine Ratio: 12 (ref 12–28)
BUN: 13 mg/dL (ref 8–27)
CO2: 30 mmol/L — ABNORMAL HIGH (ref 20–29)
Calcium: 9.8 mg/dL (ref 8.7–10.3)
Chloride: 99 mmol/L (ref 96–106)
Creatinine, Ser: 1.13 mg/dL — ABNORMAL HIGH (ref 0.57–1.00)
Glucose: 66 mg/dL — ABNORMAL LOW (ref 70–99)
Potassium: 3.7 mmol/L (ref 3.5–5.2)
Sodium: 145 mmol/L — ABNORMAL HIGH (ref 134–144)
eGFR: 50 mL/min/{1.73_m2} — ABNORMAL LOW (ref >59)

## 2022-02-12 LAB — PRO B NATRIURETIC PEPTIDE: NT-Pro BNP: 724 pg/mL (ref 0–738)

## 2022-02-14 ENCOUNTER — Telehealth: Payer: Self-pay

## 2022-02-14 ENCOUNTER — Encounter: Payer: Self-pay | Admitting: Internal Medicine

## 2022-02-14 DIAGNOSIS — R0602 Shortness of breath: Secondary | ICD-10-CM

## 2022-02-14 DIAGNOSIS — I480 Paroxysmal atrial fibrillation: Secondary | ICD-10-CM

## 2022-02-14 DIAGNOSIS — Z79899 Other long term (current) drug therapy: Secondary | ICD-10-CM | POA: Diagnosis not present

## 2022-02-14 DIAGNOSIS — I10 Essential (primary) hypertension: Secondary | ICD-10-CM

## 2022-02-14 DIAGNOSIS — I2510 Atherosclerotic heart disease of native coronary artery without angina pectoris: Secondary | ICD-10-CM

## 2022-02-14 MED ORDER — FUROSEMIDE 40 MG PO TABS: 40.0000 mg | ORAL_TABLET | Freq: Every day | ORAL | 3 refills | Status: DC

## 2022-02-14 NOTE — Telephone Encounter (Signed)
-----   Message from Dorris Carnes V, MD sent at 02/14/2022  5:14 AM EDT ----- Electrolytes:  Sodium is minimally increased    Cr is mildly increased 1.13 Fluid is within normal range I would recomm she cut back on lasix to 40 mg     Follow BMET in 2 wks with BNP Weigh every day

## 2022-02-14 NOTE — Telephone Encounter (Signed)
Left a message for the pt and sent a My Chart message... ned lab date for 2 weeks.

## 2022-02-18 NOTE — Telephone Encounter (Signed)
Patient is returning call.  °

## 2022-02-18 NOTE — Telephone Encounter (Signed)
Pt to have labs drawn 02/22/22.Marland Kitchen she had changed her Lasix dose as of 02/14/22 but could not come later due to a trip with her family.

## 2022-02-18 NOTE — Telephone Encounter (Signed)
Left a message for the pt to call back.  

## 2022-02-22 ENCOUNTER — Ambulatory Visit: Payer: Medicare Other | Attending: Internal Medicine

## 2022-02-22 ENCOUNTER — Encounter: Payer: Self-pay | Admitting: Internal Medicine

## 2022-02-22 ENCOUNTER — Telehealth: Payer: Self-pay | Admitting: Internal Medicine

## 2022-02-22 DIAGNOSIS — Z79899 Other long term (current) drug therapy: Secondary | ICD-10-CM

## 2022-02-22 DIAGNOSIS — I480 Paroxysmal atrial fibrillation: Secondary | ICD-10-CM

## 2022-02-22 DIAGNOSIS — I10 Essential (primary) hypertension: Secondary | ICD-10-CM

## 2022-02-22 NOTE — Telephone Encounter (Signed)
Left message for patient to call back  

## 2022-02-22 NOTE — Telephone Encounter (Signed)
Called patient back about her message. She stated she started feeling like her heart was irregular last night after she took off the monitor. Patient stated she had been doing good. This morning her HR was 111, before she took her morning medications. Now BP 106/69 HR 88. Patient stated she knows her HR is irregular and she is afraid she is back in A. FIB, but she does not feel bad or anything, just a little tired. Patient is worried about going out of town on vacation with family this weekend and next week. Informed patient that she should be fine to go out of town, but we will ask Dr. Harrington Challenger. Will forward to Dr. Harrington Challenger for advisement.

## 2022-02-22 NOTE — Telephone Encounter (Signed)
Patient is returning call.  °

## 2022-02-22 NOTE — Telephone Encounter (Signed)
STAT if HR is under 50 or over 120 (normal HR is 60-100 beats per minute)  What is your heart rate? 111  Do you have a log of your heart rate readings (document readings)? 62, 57,62,54, 69, 59  Do you have any other symptoms? States she it says her heart rate is irregular. She wants to know what she should do.

## 2022-02-23 LAB — BASIC METABOLIC PANEL
BUN/Creatinine Ratio: 14 (ref 12–28)
BUN: 13 mg/dL (ref 8–27)
CO2: 24 mmol/L (ref 20–29)
Calcium: 10.1 mg/dL (ref 8.7–10.3)
Chloride: 98 mmol/L (ref 96–106)
Creatinine, Ser: 0.96 mg/dL (ref 0.57–1.00)
Glucose: 83 mg/dL (ref 70–99)
Potassium: 3.9 mmol/L (ref 3.5–5.2)
Sodium: 142 mmol/L (ref 134–144)
eGFR: 61 mL/min/{1.73_m2} (ref >59)

## 2022-02-23 LAB — PRO B NATRIURETIC PEPTIDE: NT-Pro BNP: 3248 pg/mL — ABNORMAL HIGH (ref 0–738)

## 2022-02-25 ENCOUNTER — Other Ambulatory Visit: Payer: Medicare Other

## 2022-03-21 ENCOUNTER — Telehealth: Payer: Self-pay | Admitting: Internal Medicine

## 2022-03-21 ENCOUNTER — Encounter: Payer: Self-pay | Admitting: Internal Medicine

## 2022-03-21 NOTE — Telephone Encounter (Signed)
Pt called to report that she has Crohn's dz and lately it has been worse since she has been taking the Multaq.... she did not have trouble when she started it but the last few weeks her diarrhea seems to be worsened after her doses.   She says she feels bad because it has been "helping"  her heart but she is not sure if she can tolerate the continued watery stools.. she is also seeing her PCP this Monday 03/25/22.   I will forward to Dr Harrington Challenger for her review.

## 2022-03-21 NOTE — Telephone Encounter (Signed)
Pt c/o medication issue:  1. Name of Medication:   dronedarone (MULTAQ) 400 MG tablet    2. How are you currently taking this medication (dosage and times per day)? Take 1 tablet (400 mg total) by mouth 2 (two) times daily with a meal.  3. Are you having a reaction (difficulty breathing--STAT)? No  4. What is your medication issue? Pt states that she thinks medication is causing her to have an upset stomach. Pt would like a callback regarding this matter.

## 2022-03-22 NOTE — Telephone Encounter (Signed)
I would stop taking the multaq and see how she feels   Write back if diarrhea/GI symptoms subside

## 2022-03-22 NOTE — Telephone Encounter (Signed)
Pt advised and will monitor and let us know how she is doing.

## 2022-05-14 ENCOUNTER — Other Ambulatory Visit: Payer: Self-pay | Admitting: Internal Medicine

## 2022-05-14 DIAGNOSIS — E876 Hypokalemia: Secondary | ICD-10-CM

## 2022-05-14 MED ORDER — POTASSIUM CHLORIDE CRYS ER 20 MEQ PO TBCR: 20.0000 meq | EXTENDED_RELEASE_TABLET | Freq: Three times a day (TID) | ORAL | 2 refills | Status: DC

## 2022-08-20 ENCOUNTER — Other Ambulatory Visit: Payer: Self-pay | Admitting: Family Medicine

## 2022-08-20 DIAGNOSIS — Z1231 Encounter for screening mammogram for malignant neoplasm of breast: Secondary | ICD-10-CM

## 2022-10-03 ENCOUNTER — Ambulatory Visit
Admission: RE | Admit: 2022-10-03 | Discharge: 2022-10-03 | Disposition: A | Discharge status: Home / Self Care | Payer: Medicare Other | Source: Ambulatory Visit | Attending: Family Medicine | Admitting: Family Medicine

## 2022-10-03 DIAGNOSIS — Z1231 Encounter for screening mammogram for malignant neoplasm of breast: Secondary | ICD-10-CM

## 2022-10-04 ENCOUNTER — Telehealth: Payer: Self-pay | Admitting: Internal Medicine

## 2022-10-04 ENCOUNTER — Other Ambulatory Visit: Payer: Self-pay | Admitting: Family Medicine

## 2022-10-04 DIAGNOSIS — R928 Other abnormal and inconclusive findings on diagnostic imaging of breast: Secondary | ICD-10-CM

## 2022-10-04 NOTE — Telephone Encounter (Signed)
Patient c/o Palpitations:  High priority if patient c/o lightheadedness, shortness of breath, or chest pain  How long have you had palpitations/irregular HR/ Afib? Are you having the symptoms now? This morning  Are you currently experiencing lightheadedness, SOB or CP? SOB  Do you have a history of afib (atrial fibrillation) or irregular heart rhythm? Yes   Have you checked your BP or HR? (document readings if available): 126/72; 60  Are you experiencing any other symptoms? LE has fluid   [employer] called to give info on patient from appt this morning

## 2022-10-04 NOTE — Telephone Encounter (Signed)
Spoke with Diane from [facility]. Pt was had an wellness visit today with PCP and complained of symptoms. Pt mentioned to PCP her has shortness of breath, LE welling and possible a-fib this morning. Called the patient and left message to call the clinic.

## 2022-10-07 ENCOUNTER — Other Ambulatory Visit: Payer: Self-pay | Admitting: Family Medicine

## 2022-10-07 DIAGNOSIS — M810 Age-related osteoporosis without current pathological fracture: Secondary | ICD-10-CM

## 2022-10-08 NOTE — Telephone Encounter (Signed)
Left message for patient to call back  

## 2022-10-09 ENCOUNTER — Encounter: Payer: Self-pay | Admitting: Internal Medicine

## 2022-10-09 NOTE — Telephone Encounter (Signed)
Pt called back and she will see Dr Tenny Craw 10/10/22.

## 2022-10-09 NOTE — Telephone Encounter (Signed)
Attempted to call the pt but her home number was very static and no answer.. could not tell if call went through.  I left her a message on her cell... I need to make her ana ppt tom come and see Dr Tenny Craw.   Records received from [facility] and will have Dr Tenny Craw review when she is here in the office tomorrow.

## 2022-10-10 ENCOUNTER — Ambulatory Visit: Payer: Medicare Other | Attending: Internal Medicine | Admitting: Internal Medicine

## 2022-10-10 ENCOUNTER — Ambulatory Visit (INDEPENDENT_AMBULATORY_CARE_PROVIDER_SITE_OTHER): Payer: Medicare Other

## 2022-10-10 VITALS — BP 104/62 | HR 93 | Ht 60.0 in | Wt 185.0 lb

## 2022-10-10 DIAGNOSIS — I10 Essential (primary) hypertension: Secondary | ICD-10-CM

## 2022-10-10 DIAGNOSIS — I2510 Atherosclerotic heart disease of native coronary artery without angina pectoris: Secondary | ICD-10-CM

## 2022-10-10 DIAGNOSIS — Z79899 Other long term (current) drug therapy: Secondary | ICD-10-CM | POA: Diagnosis not present

## 2022-10-10 DIAGNOSIS — I480 Paroxysmal atrial fibrillation: Secondary | ICD-10-CM

## 2022-10-10 DIAGNOSIS — R0602 Shortness of breath: Secondary | ICD-10-CM

## 2022-10-10 NOTE — Progress Notes (Signed)
Cardiology Office Note   Date:  10/10/2022   ID:  Elizabeth Hebert, DOB 1946/04/28, MRN [id number]  PCP:  [name], MD  Cardiologist:   Dietrich Pates, MD   The pt presents for follow up of atrial fibrilation    History of Present Illness: Elizabeth Hebert is a 77 y.o. female with a history of HTN, CAD (s/p MI 1999; angina L jaw pain), atrial fibrillation, T2DM, GE reflux,  Crohn's disease, type 2 diabetes, anemia.  In fall 2022 the pt presented with SOB and LE edema    EKG  not done    Echo showed normal LVEF   The atria were severely dilated  On follow up visit, EKG showed atrial fibrillation    She was started on anticoagulation.  SHe underwent DCCV in Dec 2022.   In June 2023 found to be in afib, not sure when reverted.   PT noted some increased SOB/DOE.   She had a  lexiscan myoview which showed no ischemia   Echo repeated and showed normal LVEF/RVEF   Mild AS       She went on to have repeat cardioversion on 12/15/21   She says she felt better for a bit but then more SOB   Follow up in clinic  AUg 2023,  she was back in afib   She was placed on Multaq   400 bid  The pt underwent repeat cardioversion on 9.5.23.     The pt was seen by PCP on 5/3   Had evid of volume overload    Labs drawn.  Told to take lasix 40 bid for several days     SInce change, she says her ankle edema has improved and her breathing is some better   Her son is with her today   He says that back in fall when she was in SR she was doing better     With Multaq she developed N/V.  She stopped it in the fall.     She denies CP, jaw pain.   Breathing is fair.   She denies ever having palpitations     Current Meds  Medication Sig   ALPRAZolam (XANAX) 0.25 MG tablet Take 0.25 mg by mouth 2 (two) times daily as needed for anxiety.   apixaban (ELIQUIS) 5 MG TABS tablet Take 5 mg by mouth 2 (two) times daily.   budesonide-formoterol (SYMBICORT) 80-4.5 MCG/ACT inhaler Inhale 2 puffs into the lungs in the morning.    calcium citrate (CALCITRATE - DOSED IN MG ELEMENTAL CALCIUM) 950 (200 Ca) MG tablet Take 200 mg of elemental calcium by mouth every evening.   cetirizine (ZYRTEC) 10 MG tablet Take 10 mg by mouth daily as needed for allergies.   cimetidine (TAGAMET) 200 MG tablet Take 200 mg by mouth 2 (two) times daily.   ezetimibe (ZETIA) 10 MG tablet Take 10 mg by mouth in the morning.   ferrous sulfate 325 (65 FE) MG tablet Take 325 mg by mouth every evening.   furosemide (LASIX) 40 MG tablet Take 1 tablet (40 mg total) by mouth daily.   ipratropium (ATROVENT) 0.06 % nasal spray Place 2 sprays into both nostrils 3 (three) times daily.   levothyroxine (SYNTHROID) 50 MCG tablet Take 50 mcg by mouth daily before breakfast.   meloxicam (MOBIC) 7.5 MG tablet Take 7.5 mg by mouth daily as needed.   metoprolol succinate (TOPROL-XL) 100 MG 24 hr tablet Take 100 mg by mouth 2 (two) times daily.  Take with or immediately following a meal.   ONETOUCH ULTRA test strip 1 each daily.   potassium chloride SA (KLOR-CON M) 20 MEQ tablet Take 1 tablet (20 mEq total) by mouth 3 (three) times daily.   rosuvastatin (CRESTOR) 20 MG tablet Take 20 mg by mouth in the morning.   sertraline (ZOLOFT) 100 MG tablet Take 100 mg by mouth in the morning.   valsartan-hydrochlorothiazide (DIOVAN-HCT) 320-25 MG per tablet Take 1 tablet by mouth in the morning.   XULTOPHY 100-3.6 UNIT-MG/ML SOPN Inject 50 Units into the skin in the morning.     Allergies:   Catapres [clonidine] and Vasotec [enalaprilat]   Past Medical History:  Diagnosis Date   Anemia    Anginal pain (HCC)    infreq   Arthritis    Breast cancer (HCC) 12/08/2012   left lumpectomy   Broken arm 2011   Cancer (HCC)    breaast   Colitis    Depression    Diabetes mellitus without complication (HCC)    Diverticulitis    Fibroid tumor    GERD (gastroesophageal reflux disease)    H/O hiatal hernia    Heart murmur    History of cataract surgery 2012   Hx of hernia  repair    Hx of radiation therapy 01/26/13- 02/18/13   left breast 4250 cGy 17 sessions   Hypertension    Myocardial infarction (HCC)    stents x 2 when had mi 99   Personal history of radiation therapy 12/08/2012    Past Surgical History:  Procedure Laterality Date   APPENDECTOMY     BREAST LUMPECTOMY Left 12/08/2012   left lumpectomy   CARDIOVERSION N/A 05/08/2021   Procedure: CARDIOVERSION;  Surgeon: Little Ishikawa, MD;  Location: [facility] ENDOSCOPY;  Service: Cardiovascular;  Laterality: N/A;   CARDIOVERSION N/A 12/12/2021   Procedure: CARDIOVERSION;  Surgeon: Meriam Sprague, MD;  Location: [facility] ENDOSCOPY;  Service: Cardiovascular;  Laterality: N/A;   CARDIOVERSION N/A 02/05/2022   Procedure: CARDIOVERSION;  Surgeon: Parke Poisson, MD;  Location: [facility] ENDOSCOPY;  Service: Cardiovascular;  Laterality: N/A;   CHOLECYSTECTOMY     COLON RESECTION     EYE SURGERY Bilateral    cat    HERNIA REPAIR     rt   MASTECTOMY, PARTIAL Left 12/25/2012   Procedure: MASTECTOMY PARTIAL with re-excision of margins;  Surgeon: Ernestene Mention, MD;  Location: [facility];  Service: General;  Laterality: Left;   PARTIAL MASTECTOMY WITH NEEDLE LOCALIZATION AND AXILLARY SENTINEL LYMPH NODE BX Left 12/08/2012   Procedure: LEFT PARTIAL MASTECTOMY WITH NEEDLE LOCALIZATION AND AXILLARY SENTINEL LYMPH NODE BIOPSIES TIMES FOUR;  Surgeon: Ernestene Mention, MD;  Location: [facility] OR;  Service: General;  Laterality: Left;   TOTAL ABDOMINAL HYSTERECTOMY  2001     Social History:  The patient  reports that she quit smoking about 25 years ago. Her smoking use included cigarettes. She has a 30.00 pack-year smoking history. She has never used smokeless tobacco. She reports current alcohol use. She reports that she does not use drugs.   Family History:  The patient's family history includes Breast cancer in her daughter and paternal grandmother; Breast cancer (age of onset: 86) in her maternal grandmother;  Cancer in her cousin; Cervical cancer (age of onset: 62) in her paternal grandmother; Pancreatic cancer in her maternal aunt.    ROS:  Please see the history of present illness. All other systems are reviewed and  Negative to the above  problem except as noted.    PHYSICAL EXAM: VS:  BP 104/62   Pulse 93   Ht 5' (1.524 m)   Wt 185 lb (83.9 kg)   SpO2 95%   BMI 36.13 kg/m   ZOX:WRUEA 77 yo, in no acute distress  HEENT: normal  Neck: JVP is normal   No bruits   Cardiac   Irreg irreg  No signif murmurs   Tr LE edema  Respiratory:  clear to auscultation bilaterally   GI: soft, nontender  No hepatomegaly   EKG:  EKG is ordered today.    Atrial fibrillation  93 bpm  Nonspective ST T wave changes    Lipid Panel No results found for: "CHOL", "TRIG", "HDL", "CHOLHDL", "VLDL", "LDLCALC", "LDLDIRECT"    Wt Readings from Last 3 Encounters:  10/10/22 185 lb (83.9 kg)  02/11/22 174 lb 12.8 oz (79.3 kg)  02/05/22 170 lb (77.1 kg)      ASSESSMENT AND PLAN:  1. HFpEF   Pt with normal LVEF on echo in 2023   REcent increase in volume    On exam, volume may be very mildly increased     May be related to fact that she is back in afib Was on lasix 40 bid with improvement over the past few days    Will check labs (BMET and BNP)   From there decide on dosing   2  Hx PAF  Pt back in afib  Could not tolerate multaq (GI complaints)  contraindicated now with volume overload Will set up for 48 hour Zio patch to assess rates   . Need to consider whether further effort should be made for rhythm control    3  Hx LE edema   Has had in past   Minimal edema now    4  HTN   HTN  is excellent   5 history of CAD.  Remote MI in 1999   Myoview in 2023  showed no ischemia .  Patient is without symptoms of angina    6  Dyslipidemia.  Excellent control in September    LDL 36  HDL 45       Current medicines are reviewed at length with the patient today.  The patient does not have concerns regarding  medicines.  Signed, Dietrich Pates, MD  10/10/2022 9:57 AM    [facility] [address] [place], [place], [place]  [address] Phone: [phone]; Fax: [phone]

## 2022-10-10 NOTE — Patient Instructions (Signed)
Medication Instructions:   *If you need a refill on your cardiac medications before your next appointment, please call your pharmacy*   Lab Work:  LIPID, CMET, PRO BNP    If you have labs (blood work) drawn today and your tests are completely normal, you will receive your results only by: MyChart Message (if you have MyChart) OR A paper copy in the mail If you have any lab test that is abnormal or we need to change your treatment, we will call you to review the results.   Testing/Procedures: Christena Deem- Long Term Monitor Instructions  Your physician has requested you wear a ZIO patch monitor for 3 days.  This is a single patch monitor. [employer] supplies one patch monitor per enrollment. Additional stickers are not available. Please do not apply patch if you will be having a Nuclear Stress Test,  Echocardiogram, Cardiac CT, MRI, or Chest Xray during the period you would be wearing the  monitor. The patch cannot be worn during these tests. You cannot remove and re-apply the  ZIO XT patch monitor.  Your ZIO patch monitor will be mailed 3 day USPS to your address on file. It may take 3-5 days  to receive your monitor after you have been enrolled.  Once you have received your monitor, please review the enclosed instructions. Your monitor  has already been registered assigning a specific monitor serial # to you.  Billing and Patient Assistance Program Information  We have supplied [employer] with any of your insurance information on file for billing purposes. [employer] offers a sliding scale Patient Assistance Program for patients that do not have  insurance, or whose insurance does not completely cover the cost of the ZIO monitor.  You must apply for the Patient Assistance Program to qualify for this discounted rate.  To apply, please call [employer] at [phone], select option 4, select option 2, ask to apply for  Patient Assistance Program. Meredeth Ide will ask your household income, and how many  people  are in your household. They will quote your out-of-pocket cost based on that information.  [employer] will also be able to set up a 58-month, interest-free payment plan if needed.  Applying the monitor   Shave hair from upper left chest.  Hold abrader disc by orange tab. Rub abrader in 40 strokes over the upper left chest as  indicated in your monitor instructions.  Clean area with 4 enclosed alcohol pads. Let dry.  Apply patch as indicated in monitor instructions. Patch will be placed under collarbone on left  side of chest with arrow pointing upward.  Rub patch adhesive wings for 2 minutes. Remove white label marked "1". Remove the white  label marked "2". Rub patch adhesive wings for 2 additional minutes.  While looking in a mirror, press and release button in center of patch. A small green light will  flash 3-4 times. This will be your only indicator that the monitor has been turned on.  Do not shower for the first 24 hours. You may shower after the first 24 hours.  Press the button if you feel a symptom. You will hear a small click. Record Date, Time and  Symptom in the Patient Logbook.  When you are ready to remove the patch, follow instructions on the last 2 pages of Patient  Logbook. Stick patch monitor onto the last page of Patient Logbook.  Place Patient Logbook in the blue and white box. Use locking tab on box and tape box closed  securely.  The blue and white box has prepaid postage on it. Please place it in the mailbox as  soon as possible. Your physician should have your test results approximately 7 days after the  monitor has been mailed back to [facility].  Call [facility] Customer Care at [phone] if you have questions regarding  your ZIO XT patch monitor. Call them immediately if you see an orange light blinking on your  monitor.  If your monitor falls off in less than 4 days, contact our Monitor department at [phone].  If your monitor becomes  loose or falls off after 4 days call [employer] at [phone] for  suggestions on securing your monitor    Follow-Up: At [facility], you and your health needs are our priority.  As part of our continuing mission to provide you with exceptional heart care, we have created designated Provider Care Teams.  These Care Teams include your primary Cardiologist (physician) and Advanced Practice Providers (APPs -  Physician Assistants and Nurse Practitioners) who all work together to provide you with the care you need, when you need it.  We recommend signing up for the patient portal called "MyChart".  Sign up information is provided on this After Visit Summary.  MyChart is used to connect with patients for Virtual Visits (Telemedicine).  Patients are able to view lab/test results, encounter notes, upcoming appointments, etc.  Non-urgent messages can be sent to your provider as well.   To learn more about what you can do with MyChart, go to [email or web address].

## 2022-10-10 NOTE — Progress Notes (Unsigned)
ZIO XT serial # [id number] from office inventory applied to patient.

## 2022-10-11 LAB — COMPREHENSIVE METABOLIC PANEL
ALT: 20 IU/L (ref 0–32)
AST: 28 IU/L (ref 0–40)
Albumin/Globulin Ratio: 1.6 (ref 1.2–2.2)
Albumin: 4.1 g/dL (ref 3.8–4.8)
Alkaline Phosphatase: 68 IU/L (ref 44–121)
BUN/Creatinine Ratio: 11 — ABNORMAL LOW (ref 12–28)
BUN: 14 mg/dL (ref 8–27)
Bilirubin Total: 0.6 mg/dL (ref 0.0–1.2)
CO2: 29 mmol/L (ref 20–29)
Calcium: 10 mg/dL (ref 8.7–10.3)
Chloride: 98 mmol/L (ref 96–106)
Creatinine, Ser: 1.29 mg/dL — ABNORMAL HIGH (ref 0.57–1.00)
Globulin, Total: 2.5 g/dL (ref 1.5–4.5)
Glucose: 112 mg/dL — ABNORMAL HIGH (ref 70–99)
Potassium: 3.6 mmol/L (ref 3.5–5.2)
Sodium: 142 mmol/L (ref 134–144)
Total Protein: 6.6 g/dL (ref 6.0–8.5)
eGFR: 42 mL/min/{1.73_m2} — ABNORMAL LOW (ref >59)

## 2022-10-11 LAB — LIPID PANEL
Chol/HDL Ratio: 2.3 ratio (ref 0.0–4.4)
Cholesterol, Total: 131 mg/dL (ref 100–199)
HDL: 58 mg/dL (ref >39)
LDL Chol Calc (NIH): 52 mg/dL (ref 0–99)
Triglycerides: 119 mg/dL (ref 0–149)
VLDL Cholesterol Cal: 21 mg/dL (ref 5–40)

## 2022-10-11 LAB — PRO B NATRIURETIC PEPTIDE: NT-Pro BNP: 1530 pg/mL — ABNORMAL HIGH (ref 0–738)

## 2022-10-14 ENCOUNTER — Ambulatory Visit
Admission: RE | Admit: 2022-10-14 | Discharge: 2022-10-14 | Disposition: A | Discharge status: Home / Self Care | Payer: Medicare Other | Source: Ambulatory Visit | Attending: Family Medicine | Admitting: Family Medicine

## 2022-10-14 ENCOUNTER — Other Ambulatory Visit: Payer: Self-pay

## 2022-10-14 ENCOUNTER — Other Ambulatory Visit: Payer: Self-pay | Admitting: Family Medicine

## 2022-10-14 DIAGNOSIS — R928 Other abnormal and inconclusive findings on diagnostic imaging of breast: Secondary | ICD-10-CM

## 2022-10-14 DIAGNOSIS — Z79899 Other long term (current) drug therapy: Secondary | ICD-10-CM

## 2022-10-14 DIAGNOSIS — N6320 Unspecified lump in the left breast, unspecified quadrant: Secondary | ICD-10-CM

## 2022-10-18 ENCOUNTER — Ambulatory Visit
Admission: RE | Admit: 2022-10-18 | Discharge: 2022-10-18 | Disposition: A | Discharge status: Home / Self Care | Payer: Medicare Other | Source: Ambulatory Visit | Attending: Family Medicine | Admitting: Family Medicine

## 2022-10-18 DIAGNOSIS — R928 Other abnormal and inconclusive findings on diagnostic imaging of breast: Secondary | ICD-10-CM

## 2022-10-18 DIAGNOSIS — N6320 Unspecified lump in the left breast, unspecified quadrant: Secondary | ICD-10-CM

## 2022-10-18 HISTORY — PX: BREAST BIOPSY: SHX20

## 2022-10-22 ENCOUNTER — Other Ambulatory Visit: Payer: Self-pay

## 2022-10-22 DIAGNOSIS — Z79899 Other long term (current) drug therapy: Secondary | ICD-10-CM

## 2022-10-22 DIAGNOSIS — I480 Paroxysmal atrial fibrillation: Secondary | ICD-10-CM

## 2022-10-23 ENCOUNTER — Other Ambulatory Visit: Payer: Self-pay

## 2022-10-23 ENCOUNTER — Ambulatory Visit: Payer: Medicare Other | Attending: Cardiovascular Disease

## 2022-10-23 DIAGNOSIS — I480 Paroxysmal atrial fibrillation: Secondary | ICD-10-CM

## 2022-10-23 DIAGNOSIS — R0602 Shortness of breath: Secondary | ICD-10-CM

## 2022-10-23 DIAGNOSIS — I2510 Atherosclerotic heart disease of native coronary artery without angina pectoris: Secondary | ICD-10-CM

## 2022-10-23 DIAGNOSIS — Z79899 Other long term (current) drug therapy: Secondary | ICD-10-CM

## 2022-10-24 ENCOUNTER — Telehealth: Payer: Self-pay

## 2022-10-24 DIAGNOSIS — E876 Hypokalemia: Secondary | ICD-10-CM

## 2022-10-24 DIAGNOSIS — Z79899 Other long term (current) drug therapy: Secondary | ICD-10-CM

## 2022-10-24 DIAGNOSIS — I2510 Atherosclerotic heart disease of native coronary artery without angina pectoris: Secondary | ICD-10-CM

## 2022-10-24 DIAGNOSIS — I480 Paroxysmal atrial fibrillation: Secondary | ICD-10-CM

## 2022-10-24 DIAGNOSIS — I10 Essential (primary) hypertension: Secondary | ICD-10-CM

## 2022-10-24 DIAGNOSIS — R0602 Shortness of breath: Secondary | ICD-10-CM

## 2022-10-24 LAB — PRO B NATRIURETIC PEPTIDE: NT-Pro BNP: 2380 pg/mL — ABNORMAL HIGH (ref 0–738)

## 2022-10-24 LAB — BASIC METABOLIC PANEL
BUN/Creatinine Ratio: 8 — ABNORMAL LOW (ref 12–28)
BUN: 9 mg/dL (ref 8–27)
CO2: 26 mmol/L (ref 20–29)
Calcium: 9.6 mg/dL (ref 8.7–10.3)
Chloride: 99 mmol/L (ref 96–106)
Creatinine, Ser: 1.12 mg/dL — ABNORMAL HIGH (ref 0.57–1.00)
Glucose: 149 mg/dL — ABNORMAL HIGH (ref 70–99)
Potassium: 3.5 mmol/L (ref 3.5–5.2)
Sodium: 142 mmol/L (ref 134–144)
eGFR: 50 mL/min/{1.73_m2} — ABNORMAL LOW (ref >59)

## 2022-10-24 NOTE — Telephone Encounter (Signed)
Attempted phone call to pt and left voicemail message to triage at [phone] re: labs and provider comments as below.  Spoke with DOD, Dr Eldridge Dace who recommends pt continue current dose of Lasix unless she develops symptoms then she can increase to Lasix 40mg  bid x 5 days per week.  Results forwarded to Dr Tenny Craw  Per Dr Tenny Craw note 10/10/2022 - She could try lasix 40 mg one day then 40 2x per day   (alternate)

## 2022-10-24 NOTE — Telephone Encounter (Signed)
Spoke with pt and advised and advised of recommendation per Dr Eldridge Dace, DOD.  Pt states weight is stable with a pound up or down nightly.  No new SOB.  Pt states she has only been taking Lasix 40mg  daily.  Pt encouraged to take Lasix as perDr Ross's recommendation to alternate Lasix40mg  to Lasix 40mg  bid.  Pt verbalizes understanding and is agreeable.  Will call for increasing symptoms.  Pt advised will need for Dr Tenny Craw to confirm when she will need to return for labs.    Will forward to Dr Tenny Craw for further recommendation.

## 2022-10-25 NOTE — Telephone Encounter (Signed)
Come back in 10 days with BMET / BNP

## 2022-10-29 NOTE — Telephone Encounter (Signed)
My Chart sent to the pt for a lab date.

## 2022-11-08 ENCOUNTER — Ambulatory Visit: Payer: Medicare Other | Attending: Internal Medicine

## 2022-11-08 DIAGNOSIS — E876 Hypokalemia: Secondary | ICD-10-CM

## 2022-11-08 DIAGNOSIS — I10 Essential (primary) hypertension: Secondary | ICD-10-CM

## 2022-11-08 DIAGNOSIS — R0602 Shortness of breath: Secondary | ICD-10-CM

## 2022-11-08 DIAGNOSIS — I2510 Atherosclerotic heart disease of native coronary artery without angina pectoris: Secondary | ICD-10-CM

## 2022-11-08 DIAGNOSIS — Z79899 Other long term (current) drug therapy: Secondary | ICD-10-CM

## 2022-11-08 DIAGNOSIS — I480 Paroxysmal atrial fibrillation: Secondary | ICD-10-CM

## 2022-11-09 LAB — BASIC METABOLIC PANEL
BUN/Creatinine Ratio: 10 — ABNORMAL LOW (ref 12–28)
BUN: 11 mg/dL (ref 8–27)
CO2: 29 mmol/L (ref 20–29)
Calcium: 9.7 mg/dL (ref 8.7–10.3)
Chloride: 101 mmol/L (ref 96–106)
Creatinine, Ser: 1.05 mg/dL — ABNORMAL HIGH (ref 0.57–1.00)
Glucose: 96 mg/dL (ref 70–99)
Potassium: 3.9 mmol/L (ref 3.5–5.2)
Sodium: 144 mmol/L (ref 134–144)
eGFR: 54 mL/min/{1.73_m2} — ABNORMAL LOW (ref >59)

## 2022-11-09 LAB — PRO B NATRIURETIC PEPTIDE: NT-Pro BNP: 2324 pg/mL — ABNORMAL HIGH (ref 0–738)

## 2022-11-11 ENCOUNTER — Telehealth: Payer: Self-pay

## 2022-11-11 DIAGNOSIS — I480 Paroxysmal atrial fibrillation: Secondary | ICD-10-CM

## 2022-11-11 DIAGNOSIS — Z79899 Other long term (current) drug therapy: Secondary | ICD-10-CM

## 2022-11-11 DIAGNOSIS — I10 Essential (primary) hypertension: Secondary | ICD-10-CM

## 2022-11-11 DIAGNOSIS — I2510 Atherosclerotic heart disease of native coronary artery without angina pectoris: Secondary | ICD-10-CM

## 2022-11-11 DIAGNOSIS — R0602 Shortness of breath: Secondary | ICD-10-CM

## 2022-11-11 MED ORDER — FUROSEMIDE 40 MG PO TABS: ORAL_TABLET | ORAL | 3 refills | Status: DC

## 2022-11-11 NOTE — Telephone Encounter (Signed)
Pt advised that Dr Tenny Craw does want to get another 3 day Zio.

## 2022-11-11 NOTE — Telephone Encounter (Signed)
-----   Message from Dietrich Pates V, MD sent at 11/11/2022  2:02 PM EDT ----- Kidney function and electrolytes are OK FLuid is still up Can increase lasix to 80 mg alternating with 40 mg per day Get BMET and BNP in 2 wks  She is wearing monitor now

## 2022-11-11 NOTE — Telephone Encounter (Signed)
Spoke with the pt and her daughter with Dr Tenny Craw' recommendations and her daughter was very frustrated with her her mom's condition.. she is very SOB and not able to do much of her ADL's without giving out... I advised her that it is delicate managing her fluid and watching her kidney function... but I can message Dr Tenny Craw to see if she can talk with them.   I am also following up on her recent ZIO report.

## 2022-11-11 NOTE — Telephone Encounter (Signed)
Per our monitor nurse the Zio box was not received by [employer] since no tracking of it being scanned in noted..   I spoke with the Elizabeth Hebert and she says that her daughter put it in the external mailbox at the post office on a Sunday a few weeks ago.   Per montior nurse:  [employer]PS never scanned it. It is possible it was lost be USPS, but nothing can be done at this point.    Will talk with Dr Tenny Craw.

## 2022-11-21 DIAGNOSIS — I10 Essential (primary) hypertension: Secondary | ICD-10-CM

## 2022-11-21 DIAGNOSIS — Z79899 Other long term (current) drug therapy: Secondary | ICD-10-CM

## 2022-11-21 DIAGNOSIS — R0602 Shortness of breath: Secondary | ICD-10-CM

## 2022-11-21 DIAGNOSIS — I2510 Atherosclerotic heart disease of native coronary artery without angina pectoris: Secondary | ICD-10-CM | POA: Diagnosis not present

## 2022-11-21 DIAGNOSIS — I480 Paroxysmal atrial fibrillation: Secondary | ICD-10-CM

## 2022-11-26 ENCOUNTER — Ambulatory Visit: Payer: Medicare Other | Attending: Internal Medicine

## 2022-11-26 DIAGNOSIS — I480 Paroxysmal atrial fibrillation: Secondary | ICD-10-CM

## 2022-11-26 DIAGNOSIS — I10 Essential (primary) hypertension: Secondary | ICD-10-CM

## 2022-11-26 DIAGNOSIS — I2510 Atherosclerotic heart disease of native coronary artery without angina pectoris: Secondary | ICD-10-CM

## 2022-11-26 DIAGNOSIS — Z79899 Other long term (current) drug therapy: Secondary | ICD-10-CM

## 2022-11-26 DIAGNOSIS — R0602 Shortness of breath: Secondary | ICD-10-CM

## 2022-11-27 LAB — BASIC METABOLIC PANEL
BUN/Creatinine Ratio: 15 (ref 12–28)
BUN: 18 mg/dL (ref 8–27)
CO2: 28 mmol/L (ref 20–29)
Calcium: 9.9 mg/dL (ref 8.7–10.3)
Chloride: 97 mmol/L (ref 96–106)
Creatinine, Ser: 1.24 mg/dL — ABNORMAL HIGH (ref 0.57–1.00)
Glucose: 281 mg/dL — ABNORMAL HIGH (ref 70–99)
Potassium: 3.8 mmol/L (ref 3.5–5.2)
Sodium: 138 mmol/L (ref 134–144)
eGFR: 45 mL/min/{1.73_m2} — ABNORMAL LOW (ref >59)

## 2022-11-27 LAB — PRO B NATRIURETIC PEPTIDE: NT-Pro BNP: 2281 pg/mL — ABNORMAL HIGH (ref 0–738)

## 2022-12-06 ENCOUNTER — Telehealth: Payer: Self-pay | Admitting: Internal Medicine

## 2022-12-06 DIAGNOSIS — E876 Hypokalemia: Secondary | ICD-10-CM

## 2022-12-06 NOTE — Telephone Encounter (Signed)
Pt calling regarding test results. Please advise 

## 2022-12-06 NOTE — Telephone Encounter (Signed)
Returned call to patient to let her know that I don't have Dr Tenny Craw' interpretation of the heart monitor yet. She also asks about labs which I informed her the same. It does appear her kidney function was worse on the last set of labs. She states politely that she is getting very frustrated with what to do next. She feels like she's not getting any better, taking 5-6 steps and still feels out of breath. Doesn't condone feeling worse, just doesn't feel like she's making any progress or knows where to go from here. Explained I'll route to [employer] and understands that she is on vacation all next week, so I may not have a reply quickly.

## 2022-12-11 MED ORDER — POTASSIUM CHLORIDE CRYS ER 20 MEQ PO TBCR
40.0000 meq | EXTENDED_RELEASE_TABLET | Freq: Two times a day (BID) | ORAL | 2 refills | Status: DC
Start: 2022-12-11 — End: 2023-02-11

## 2022-12-11 NOTE — Telephone Encounter (Addendum)
Per result note:   Pt in rate controlled afib    Still gets SOB. Did not tolerate Multaq (N/V)   Recomm admit for Tikosyn   Please arrange (possibly though afib clinic) Pt will need to go up on KCL to 40 / 40 mg  Will make appt in the Afib clinic per [facility].   Appt 12/30/22 at 3:30 pm.

## 2022-12-11 NOTE — Addendum Note (Signed)
Addended by: Bertram Millard on: 12/11/2022 04:51 PM   Modules accepted: Orders

## 2022-12-30 ENCOUNTER — Ambulatory Visit (HOSPITAL_COMMUNITY)
Admission: RE | Admit: 2022-12-30 | Discharge: 2022-12-30 | Disposition: A | Discharge status: Home / Self Care | Payer: Medicare Other | Source: Ambulatory Visit | Attending: Internal Medicine | Admitting: Internal Medicine

## 2022-12-30 ENCOUNTER — Encounter (HOSPITAL_COMMUNITY): Payer: Self-pay | Admitting: Internal Medicine

## 2022-12-30 VITALS — BP 112/68 | HR 85 | Ht 60.0 in | Wt 196.4 lb

## 2022-12-30 DIAGNOSIS — K5090 Crohn's disease, unspecified, without complications: Secondary | ICD-10-CM | POA: Diagnosis not present

## 2022-12-30 DIAGNOSIS — D649 Anemia, unspecified: Secondary | ICD-10-CM | POA: Insufficient documentation

## 2022-12-30 DIAGNOSIS — I4819 Other persistent atrial fibrillation: Secondary | ICD-10-CM | POA: Diagnosis present

## 2022-12-30 DIAGNOSIS — D6869 Other thrombophilia: Secondary | ICD-10-CM | POA: Diagnosis not present

## 2022-12-30 DIAGNOSIS — I2510 Atherosclerotic heart disease of native coronary artery without angina pectoris: Secondary | ICD-10-CM | POA: Diagnosis not present

## 2022-12-30 DIAGNOSIS — K219 Gastro-esophageal reflux disease without esophagitis: Secondary | ICD-10-CM | POA: Insufficient documentation

## 2022-12-30 DIAGNOSIS — R0602 Shortness of breath: Secondary | ICD-10-CM | POA: Insufficient documentation

## 2022-12-30 DIAGNOSIS — Z79899 Other long term (current) drug therapy: Secondary | ICD-10-CM | POA: Diagnosis not present

## 2022-12-30 DIAGNOSIS — Z7901 Long term (current) use of anticoagulants: Secondary | ICD-10-CM | POA: Diagnosis not present

## 2022-12-30 DIAGNOSIS — I5032 Chronic diastolic (congestive) heart failure: Secondary | ICD-10-CM | POA: Diagnosis not present

## 2022-12-30 DIAGNOSIS — I110 Hypertensive heart disease with heart failure: Secondary | ICD-10-CM | POA: Diagnosis not present

## 2022-12-30 DIAGNOSIS — R9431 Abnormal electrocardiogram [ECG] [EKG]: Secondary | ICD-10-CM | POA: Diagnosis not present

## 2022-12-30 DIAGNOSIS — E119 Type 2 diabetes mellitus without complications: Secondary | ICD-10-CM | POA: Insufficient documentation

## 2022-12-30 NOTE — Progress Notes (Addendum)
Primary Care Physician: [name], MD Primary Cardiologist: Dr. Tenny Craw Electrophysiologist: None     Referring Physician: Dr. Curley Spice is a 77 y.o. female with a history of HTN, CAD s/p MI 1999, T2DM, GERD, Crohn's disease, anemia, HFpEF, and persistent atrial fibrillation who presents for consultation in the [facility]. Seen by Dr. Tenny Craw on 5/9 and noted to be in Afib; wore cardiac monitor which showed 100% Afib burden and sent to Afib clinic to discuss Tikosyn admission. Patient is on Eliquis 5 mg BID for a CHADS2VASC score of 6.  On evaluation today, patient is currently in rate controlled Afib. She feels tired when in Afib. She has not missed any doses of Eliquis.   Addendum: She notes SOB when walking several steps and history of LE edema for which takes lasix.   Today, she denies symptoms of palpitations, chest pain, orthopnea, PND, dizziness, presyncope, syncope, snoring, daytime somnolence, bleeding, or neurologic sequela. The patient is tolerating medications without difficulties and is otherwise without complaint today.   she has a BMI of Body mass index is 38.36 kg/m.Marland Kitchen Filed Weights   12/30/22 1525  Weight: 89.1 kg    Current Outpatient Medications  Medication Sig Dispense Refill   ALPRAZolam (XANAX) 0.25 MG tablet Take 0.25 mg by mouth 2 (two) times daily as needed for anxiety.     apixaban (ELIQUIS) 5 MG TABS tablet Take 5 mg by mouth 2 (two) times daily.     budesonide-formoterol (SYMBICORT) 80-4.5 MCG/ACT inhaler Inhale 2 puffs into the lungs in the morning.     calcium citrate (CALCITRATE - DOSED IN MG ELEMENTAL CALCIUM) 950 (200 Ca) MG tablet Take 200 mg of elemental calcium by mouth every evening.     cetirizine (ZYRTEC) 10 MG tablet Take 10 mg by mouth daily as needed for allergies.     cimetidine (TAGAMET) 200 MG tablet Take 200 mg by mouth 2 (two) times daily.     ezetimibe (ZETIA) 10 MG tablet Take 10 mg by  mouth in the morning.     ferrous sulfate 325 (65 FE) MG tablet Take 325 mg by mouth every evening.     furosemide (LASIX) 40 MG tablet Take one tablet (40 mg) by mouth every other day alternating with two tablets ( 80 mg) on the opposite days 90 tablet 3   ipratropium (ATROVENT) 0.06 % nasal spray Place 2 sprays into both nostrils 3 (three) times daily.     levothyroxine (SYNTHROID) 50 MCG tablet Take 50 mcg by mouth daily before breakfast.     meloxicam (MOBIC) 7.5 MG tablet Take 7.5 mg by mouth daily as needed.     metoprolol succinate (TOPROL-XL) 100 MG 24 hr tablet Take 100 mg by mouth 2 (two) times daily. Take with or immediately following a meal.     ONETOUCH ULTRA test strip 1 each daily.     potassium chloride SA (KLOR-CON M) 20 MEQ tablet Take 2 tablets (40 mEq total) by mouth 2 (two) times daily. 270 tablet 2   rosuvastatin (CRESTOR) 20 MG tablet Take 20 mg by mouth in the morning.     sertraline (ZOLOFT) 100 MG tablet Take 100 mg by mouth in the morning.     valsartan-hydrochlorothiazide (DIOVAN-HCT) 320-25 MG per tablet Take 1 tablet by mouth in the morning.  5   XULTOPHY 100-3.6 UNIT-MG/ML SOPN Inject 50 Units into the skin in the morning.     No  current facility-administered medications for this encounter.    Atrial Fibrillation Management history:  Previous antiarrhythmic drugs: Multaq Previous cardioversions: None Previous ablations: None Anticoagulation history: Eliquis   ROS- All systems are reviewed and negative except as per the HPI above.  Physical Exam: BP 112/68   Pulse 85   Ht 5' (1.524 m)   Wt 89.1 kg   BMI 38.36 kg/m   GEN: Well nourished, well developed in no acute distress NECK: No JVD; No carotid bruits CARDIAC: Irregularly irregular rate and rhythm, no murmurs, rubs, gallops RESPIRATORY:  Clear to auscultation without rales, wheezing or rhonchi  ABDOMEN: Soft, non-tender, non-distended EXTREMITIES:  No edema; No deformity   EKG today  demonstrates  Vent. rate 85 BPM PR interval * ms QRS duration 88 ms QT/QTcB 406/483 ms P-R-T axes * 88 6 Atrial fibrillation Nonspecific T wave abnormality Prolonged QT Abnormal ECG When compared with ECG of 05-Feb-2022 12:00, PREVIOUS ECG IS PRESENT  Echo 12/05/21 demonstrated  1. Left ventricular ejection fraction, by estimation, is 60 to 65%. The  left ventricle has normal function. The left ventricle has no regional  wall motion abnormalities. There is moderate concentric left ventricular  hypertrophy. Left ventricular  diastolic parameters are indeterminate.   2. Right ventricular systolic function is normal. The right ventricular  size is normal. There is mildly elevated pulmonary artery systolic  pressure. The estimated right ventricular systolic pressure is 38.8 mmHg.   3. Left atrial size was severely dilated.   4. The mitral valve is abnormal. Mild mitral valve regurgitation. No  evidence of mitral stenosis. Moderate to severe mitral annular  calcification.   5. The aortic valve is tricuspid. There is severe calcifcation of the  aortic valve. Aortic valve regurgitation is mild. Mild to moderate aortic  valve stenosis. Aortic valve area, by VTI measures 1.34 cm. Aortic valve  mean gradient measures 12.0 mmHg.   6. The inferior vena cava is normal in size with greater than 50%  respiratory variability, suggesting right atrial pressure of 3 mmHg.   7. The patient was in atrial fibrillation.   Cardiac monitor 11/2022: 100% Afib burden with avg of 82 bpm  ASSESSMENT & PLAN CHA2DS2-VASc Score = 6  The patient's score is based upon: CHF History: 0 HTN History: 1 Diabetes History: 1 Stroke History: 0 Vascular Disease History: 1 Age Score: 2 Gender Score: 1       ASSESSMENT AND PLAN: Persistent Atrial Fibrillation (ICD10:  I48.19) The patient's CHA2DS2-VASc score is 6, indicating a 9.7% annual risk of stroke.    She is in rate controlled Afib today.  Discussion  on options for treatment of Afib. She cannot take flecainide due to CAD history. Review of ECGs when in NSR show a prolonged QT interval of 507 ms. Due to this, Tikosyn is likely not a candidate for treatment option. I will discuss Tikosyn vs amiodarone as possible choices with EP given her prolonged QT interval. She would likely have to transition from sertraline with either choice. At this time, her most likely option for treatment would be an ablation. We discussed this treatment choice and after discussion will refer to EP to discuss ablation. She is not sure about this and will continue to discuss treatment choices with son. If medication treatment is not favored by EP, then ablation would be only remaining option and patient states by default would then choose procedure.  Secondary Hypercoagulable State (ICD10:  D68.69) The patient is at significant risk for stroke/thromboembolism based upon  her CHA2DS2-VASc Score of 6.  Continue Apixaban (Eliquis).  No missed doses.    Follow up will be determined after patient discusses with son and I discuss case with EP.  Addendum 7/31: After speaking with Dr. Lalla Brothers, QT interval in NSR is prolonged. Advised not to begin antiarrhythmic and continue with rate control for now.    Lake Bells, PA-C  Afib Clinic [facility] [address] [place], [place] [address] [phone]

## 2022-12-30 NOTE — Addendum Note (Signed)
Encounter addended by: Learta Codding, CMA on: 12/30/2022 4:10 PM  Actions taken: Order list changed, Diagnosis association updated

## 2023-01-01 NOTE — Addendum Note (Signed)
Encounter addended by: Eustace Pen, PA-C on: 01/01/2023 2:32 PM  Actions taken: Clinical Note Signed

## 2023-01-01 NOTE — Addendum Note (Signed)
Encounter addended by: Eustace Pen, PA-C on: 01/01/2023 8:58 AM  Actions taken: Clinical Note Signed, In Basket message sent

## 2023-01-02 ENCOUNTER — Telehealth (HOSPITAL_COMMUNITY): Payer: Self-pay

## 2023-01-02 ENCOUNTER — Encounter: Payer: Self-pay | Admitting: Internal Medicine

## 2023-01-02 NOTE — Telephone Encounter (Signed)
Patient notified and she verbalized understanding

## 2023-01-02 NOTE — Telephone Encounter (Signed)
-----   Message from Lake Bells sent at 01/01/2023  8:57 AM EDT ----- Regarding: F/u from discussion with Dr. Helen Hashimoto,  Can you please call patient and let them know I discussed case with Dr. Lalla Brothers. Due to prolonged QT interval, he recommended avoiding amiodarone at this time. We will continue as planned for them to see Dr. Lalla Brothers to discuss ablation.  Thank you,  Cletis Athens

## 2023-02-10 ENCOUNTER — Other Ambulatory Visit: Payer: Self-pay | Admitting: Internal Medicine

## 2023-02-10 DIAGNOSIS — E876 Hypokalemia: Secondary | ICD-10-CM

## 2023-02-17 NOTE — Progress Notes (Unsigned)
Electrophysiology Office Note:    Date:  02/18/2023   ID:  Elizabeth Hebert, DOB 01-24-1946, MRN [id number]  PCP:  [name], MD   [facility] Providers Cardiologist:  None     Referring MD: Eustace Pen, PA-C   History of Present Illness:    Elizabeth Hebert is a 77 y.o. female with a medical history significant for persistent atrial fibrillation, coronary disease status post MI 1999, Crohn's disease, HFpEF referred for arrhythmia management.     She presented in the fall 2022 with heart failure symptoms --dyspnea and edema.  Echo showed normal EF but the atria were severely dilated.  She returned in atrial fibrillation and underwent DC cardioversion in December 2022.  She had recurrence of atrial fibrillation and repeat cardioversion in July 2023.  The following month she was back in atrial fibrillation and started on Multaq.  Cardioversion was performed in September 2023.  After cardioversion, she did have some improvement in shortness of breath. She was again noted to be in AF in May 2024. A monitor showed that her AF burden is 100%.     She is extremely symptomatic with fatigue; she reports these symptoms improved transiently after her cardioversions.  EKGs/Labs/Other Studies Reviewed Today:    Echocardiogram:  TTE December 07, 2021 EF 60 to 65%.  Moderate concentric left ventricular hypertrophy.  Severely dilated left atrium.  Mild mitral valve regurgitation.   Monitors:  3-day Zio patch June 2024 -- my interpretation On her percent atrial fibrillation burden rate 50-140, average 82 bpm  Stress testing:   Advanced imaging:   Cardiac catherization    EKG:         Physical Exam:    VS:  BP 112/68   Pulse 93   Ht 5' (1.524 m)   Wt 203 lb (92.1 kg)   SpO2 92%   BMI 39.65 kg/m     Wt Readings from Last 3 Encounters:  02/18/23 203 lb (92.1 kg)  12/30/22 196 lb 6.4 oz (89.1 kg)  10/10/22 185 lb (83.9 kg)     GEN: Well nourished,  well developed in no acute distress CARDIAC: iRRR, no murmurs, rubs, gallops RESPIRATORY:  Normal work of breathing MUSCULOSKELETAL: no edema    ASSESSMENT & PLAN:    Persistent atrial fibrillation Symptomatic with shortness of breath, fatigue S/p multiple cardioversions Failed flecainide Will try amiodarone -- slow oral load due to baseline Qtc prolongation Will plan for cardioversion in 1-2 weeks Will re-evaluate QT after cardioversion in sinus rhythm. She did not tolerate multaq due to GI distress. I advised her to DC amiodarone if she does not tolerate it If she fails amiodarone, I think we will need to consider pacer/AVJ -- she may feel better off high doses of metoprolol   Secondary hypercoagulable state CHADSc-Vasc is 6 Continue apixaban 5mg       Signed, Maurice Small, MD  02/18/2023 12:56 PM    [facility]

## 2023-02-18 ENCOUNTER — Ambulatory Visit: Payer: Medicare Other | Attending: Cardiology | Admitting: Cardiovascular Disease

## 2023-02-18 ENCOUNTER — Encounter: Payer: Self-pay | Admitting: Cardiovascular Disease

## 2023-02-18 VITALS — BP 112/68 | HR 93 | Ht 60.0 in | Wt 203.0 lb

## 2023-02-18 DIAGNOSIS — I4819 Other persistent atrial fibrillation: Secondary | ICD-10-CM

## 2023-02-18 MED ORDER — AMIODARONE HCL 200 MG PO TABS: ORAL_TABLET | ORAL | 3 refills | Status: DC

## 2023-02-18 NOTE — Patient Instructions (Signed)
Medication Instructions:  START Amiodarone 200 mg twice daily for 14 days, then decrease to 1 tablet ONCE daily *If you need a refill on your cardiac medications before your next appointment, please call your pharmacy*   Testing/Procedures: Cardioversion Your physician has recommended that you have a Cardioversion (DCCV). Electrical Cardioversion uses a jolt of electricity to your heart either through paddles or wired patches attached to your chest. This is a controlled, usually prescheduled, procedure. Defibrillation is done under light anesthesia in the hospital, and you usually go home the day of the procedure. This is done to get your heart back into a normal rhythm. You are not awake for the procedure. Please see the instruction sheet given to you today.   Follow-Up: At [facility], you and your health needs are our priority.  As part of our continuing mission to provide you with exceptional heart care, we have created designated Provider Care Teams.  These Care Teams include your primary Cardiologist (physician) and Advanced Practice Providers (APPs -  Physician Assistants and Nurse Practitioners) who all work together to provide you with the care you need, when you need it.  We recommend signing up for the patient portal called "MyChart".  Sign up information is provided on this After Visit Summary.  MyChart is used to connect with patients for Virtual Visits (Telemedicine).  Patients are able to view lab/test results, encounter notes, upcoming appointments, etc.  Non-urgent messages can be sent to your provider as well.   To learn more about what you can do with MyChart, go to [email or web address].    Your next appointment:   4-6 weeks  Provider:   York Pellant, MD

## 2023-03-05 NOTE — Progress Notes (Signed)
Spoke to patient and instructed them to come at 11:30  and to be NPO after 0000. Advised to take Eliquis and amiodarone in the morning with a sip of water.    Confirmed that patient will have a ride home and someone to stay with them for 24 hours after the procedure.

## 2023-03-06 ENCOUNTER — Encounter (HOSPITAL_COMMUNITY)
Admission: RE | Disposition: A | Discharge status: Home / Self Care | Payer: Self-pay | Source: Home / Self Care | Attending: Cardiology

## 2023-03-06 ENCOUNTER — Encounter (HOSPITAL_COMMUNITY): Payer: Self-pay | Admitting: Cardiology

## 2023-03-06 ENCOUNTER — Ambulatory Visit (HOSPITAL_COMMUNITY): Payer: Medicare Other | Admitting: Certified Registered Nurse Anesthetist

## 2023-03-06 ENCOUNTER — Other Ambulatory Visit: Payer: Self-pay

## 2023-03-06 ENCOUNTER — Ambulatory Visit (HOSPITAL_COMMUNITY)
Admission: RE | Admit: 2023-03-06 | Discharge: 2023-03-06 | Disposition: A | Discharge status: Home / Self Care | Payer: Medicare Other | Attending: Cardiology | Admitting: Cardiology

## 2023-03-06 DIAGNOSIS — I2510 Atherosclerotic heart disease of native coronary artery without angina pectoris: Secondary | ICD-10-CM | POA: Diagnosis not present

## 2023-03-06 DIAGNOSIS — I4891 Unspecified atrial fibrillation: Secondary | ICD-10-CM

## 2023-03-06 DIAGNOSIS — D6869 Other thrombophilia: Secondary | ICD-10-CM | POA: Diagnosis not present

## 2023-03-06 DIAGNOSIS — Z7901 Long term (current) use of anticoagulants: Secondary | ICD-10-CM | POA: Diagnosis not present

## 2023-03-06 DIAGNOSIS — I5032 Chronic diastolic (congestive) heart failure: Secondary | ICD-10-CM | POA: Insufficient documentation

## 2023-03-06 DIAGNOSIS — I252 Old myocardial infarction: Secondary | ICD-10-CM | POA: Insufficient documentation

## 2023-03-06 DIAGNOSIS — I4819 Other persistent atrial fibrillation: Secondary | ICD-10-CM | POA: Diagnosis present

## 2023-03-06 DIAGNOSIS — K5090 Crohn's disease, unspecified, without complications: Secondary | ICD-10-CM | POA: Insufficient documentation

## 2023-03-06 HISTORY — PX: CARDIOVERSION: SHX1299

## 2023-03-06 LAB — POCT I-STAT, CHEM 8
BUN: 14 mg/dL (ref 8–23)
Calcium, Ion: 1.17 mmol/L (ref 1.15–1.40)
Chloride: 95 mmol/L — ABNORMAL LOW (ref 98–111)
Creatinine, Ser: 1.4 mg/dL — ABNORMAL HIGH (ref 0.44–1.00)
Glucose, Bld: 142 mg/dL — ABNORMAL HIGH (ref 70–99)
HCT: 39 % (ref 36.0–46.0)
Hemoglobin: 13.3 g/dL (ref 12.0–15.0)
Potassium: 3.3 mmol/L — ABNORMAL LOW (ref 3.5–5.1)
Sodium: 140 mmol/L (ref 135–145)
TCO2: 31 mmol/L (ref 22–32)

## 2023-03-06 SURGERY — CARDIOVERSION
Anesthesia: General

## 2023-03-06 MED ORDER — PROPOFOL 10 MG/ML IV BOLUS
INTRAVENOUS | Status: DC | PRN
  Administered 2023-03-06: 40 mg via INTRAVENOUS

## 2023-03-06 MED ORDER — LIDOCAINE 2% (20 MG/ML) 5 ML SYRINGE
INTRAMUSCULAR | Status: DC | PRN
  Administered 2023-03-06: 60 mg via INTRAVENOUS

## 2023-03-06 MED ORDER — SODIUM CHLORIDE 0.9 % IV SOLN
INTRAVENOUS | Status: DC
  Administered 2023-03-06: 20 mL/h via INTRAVENOUS

## 2023-03-06 SURGICAL SUPPLY — 1 items: PAD DEFIB RADIO PHYSIO CONN (PAD) ×2 IMPLANT

## 2023-03-06 NOTE — Transfer of Care (Signed)
Immediate Anesthesia Transfer of Care Note  Patient: Elizabeth Hebert  Procedure(s) Performed: CARDIOVERSION  Patient Location: Cath Lab  Anesthesia Type:General  Level of Consciousness: drowsy, patient cooperative, and responds to stimulation  Airway & Oxygen Therapy: Patient Spontanous Breathing and Patient connected to nasal cannula oxygen  Post-op Assessment: Report given to RN, Post -op Vital signs reviewed and stable, Patient moving all extremities X 4, and Patient able to stick tongue midline  Post vital signs: Reviewed and stable  Last Vitals:  Vitals Value Taken Time  BP 112/82 03/06/23 1245  Temp 98.6   Pulse 62 03/06/23 1246  Resp 20 03/06/23 1246  SpO2 97 % 03/06/23 1246  Vitals shown include unfiled device data.  Last Pain:  Vitals:   03/06/23 1137  TempSrc: Temporal         Complications: No notable events documented.

## 2023-03-06 NOTE — Anesthesia Preprocedure Evaluation (Signed)
Anesthesia Evaluation  Patient identified by MRN, date of birth, ID band Patient awake    Reviewed: Allergy & Precautions, NPO status , Patient's Chart, lab work & pertinent test results  Airway Mallampati: II  TM Distance: >3 FB Neck ROM: Full    Dental no notable dental hx.    Pulmonary neg pulmonary ROS, former smoker   Pulmonary exam normal        Cardiovascular hypertension, Pt. on medications and Pt. on home beta blockers + CAD and + Past MI   Rhythm:Irregular Rate:Normal     Neuro/Psych    Depression       GI/Hepatic Neg liver ROS, hiatal hernia,GERD  ,,  Endo/Other  diabetes    Renal/GU      Musculoskeletal  (+) Arthritis , Osteoarthritis,    Abdominal Normal abdominal exam  (+)   Peds  Hematology  (+) Blood dyscrasia, anemia   Anesthesia Other Findings   Reproductive/Obstetrics                             Anesthesia Physical Anesthesia Plan  ASA: 3  Anesthesia Plan: General   Post-op Pain Management:    Induction: Intravenous  PONV Risk Score and Plan: 3 and Treatment may vary due to age or medical condition  Airway Management Planned: Simple Face Mask and Nasal Cannula  Additional Equipment: None  Intra-op Plan:   Post-operative Plan:   Informed Consent: I have reviewed the patients History and Physical, chart, labs and discussed the procedure including the risks, benefits and alternatives for the proposed anesthesia with the patient or authorized representative who has indicated his/her understanding and acceptance.     Dental advisory given  Plan Discussed with: CRNA  Anesthesia Plan Comments:        Anesthesia Quick Evaluation

## 2023-03-06 NOTE — Interval H&P Note (Signed)
History and Physical Interval Note:  03/06/2023 12:09 PM  Elizabeth Hebert  has presented today for surgery, with the diagnosis of AFIB.  The various methods of treatment have been discussed with the patient and family. After consideration of risks, benefits and other options for treatment, the patient has consented to  Procedure(s): CARDIOVERSION (N/A) as a surgical intervention.  The patient's history has been reviewed, patient examined, no change in status, stable for surgery.  I have reviewed the patient's chart and labs.  Questions were answered to the patient's satisfaction.     Olga Millers

## 2023-03-06 NOTE — Anesthesia Postprocedure Evaluation (Signed)
Anesthesia Post Note  Patient: Elizabeth Hebert  Procedure(s) Performed: CARDIOVERSION     Patient location during evaluation: PACU Anesthesia Type: General Level of consciousness: awake and alert Pain management: pain level controlled Vital Signs Assessment: post-procedure vital signs reviewed and stable Respiratory status: spontaneous breathing, nonlabored ventilation, respiratory function stable and patient connected to nasal cannula oxygen Cardiovascular status: blood pressure returned to baseline and stable Postop Assessment: no apparent nausea or vomiting Anesthetic complications: no   No notable events documented.  Last Vitals:  Vitals:   03/06/23 1305 03/06/23 1310  BP: 119/76 131/70  Pulse: 67 67  Resp: 18 18  Temp:    SpO2: 96% 98%    Last Pain:  Vitals:   03/06/23 1305  TempSrc:   PainSc: 0-No pain                 Earl Lites P Jaquia Benedicto

## 2023-03-06 NOTE — Discharge Instructions (Signed)
 Electrical Cardioversion Electrical cardioversion is the delivery of a jolt of electricity to restore a normal rhythm to the heart. A rhythm that is too fast or is not regular (arrhythmia) keeps the heart from pumping blood well. There is also another type of cardioversion called a chemical (pharmacologic) cardioversion. This is when your health care provider gives you one or more medicines to bring back your regular heart rhythm. Electrical cardioversion is done as a scheduled procedure for arrhythmiasthat are not life-threatening. Electrical cardioversion may also be done in an emergency for sudden life-threatening arrhythmias. Tell a health care provider about: Any allergies you have. All medicines you are taking, including vitamins, herbs, eye drops, creams, and over-the-counter medicines. Any problems you or family members have had with sedatives or anesthesia. Any bleeding problems you have. Any surgeries you have had, including a pacemaker, defibrillator, or other implanted device. Any medical conditions you have. Whether you are pregnant or may be pregnant. What are the risks? Your provider will talk with you about risks. These include: Allergic reactions to medicines. Irritation to the skin on your chest or back where the sticky pads (electrodes) or paddles were put during electrical cardioversion. A blood clot that breaks free and travels to other parts of your body, such as your brain. Return of a worse abnormal heart rhythm that will need to be treated with medicines, a pacemaker, or an implantable cardioverter defibrillator (ICD). What happens before the procedure? Medicines Your provider may give you: Blood-thinning medicines (anticoagulants) so your blood does not clot as easily. If your provider gives you this medicine, you may need to take it for 4 weeks before the procedure. Medicines to help stabilize your heart rate and rhythm. Ask your provider about: Changing or stopping  your regular medicines. These include any diabetes medicines or blood thinners you take. Taking medicines such as aspirin and ibuprofen. These medicines can thin your blood. Do not take them unless your provider tells you to. Taking over-the-counter medicines, vitamins, herbs, and supplements. General instructions Follow instructions from your provider about what you may eat and drink. Do not put any lotions, powders, or ointments on your chest and back for 24 hours before the procedure. They can cause problems with the electrodes or paddles used to deliver electricity to your heart. Do not wear jewelry as this can interfere with delivering electricity to your heart. If you will be going home right after the procedure, plan to have a responsible adult: Take you home from the hospital or clinic. You will not be allowed to drive. Care for you for the time you are told. Tests You may have an exam or testing. This may include: Blood labs. A transesophageal echocardiogram (TEE). What happens during the procedure?     An IV will be inserted into one of your veins. You will be given a sedative. This helps you relax. Electrodes or metal paddles will be placed on your chest. They may be placed in one of these ways: One placed on your right chest, the other on the left ribs. One placed on your chest and the other on your back. An electrical shock will be delivered. The shock briefly stops (resets) your heart rhythm. Your provider will check to see if your heart rhythm is now normal. Some people need only one shock. Some need more to restore a normal heart rhythm. The procedure may vary among providers and hospitals. What happens after the procedure? Your blood pressure, heart rate, breathing rate, and blood oxygen  level will be monitored until you leave the hospital or clinic. Your heart rhythm will be watched to make sure it does not change. This information is not intended to replace advice  given to you by your health care provider. Make sure you discuss any questions you have with your health care provider. Document Revised: 01/10/2022 Document Reviewed: 01/10/2022 Elsevier Patient Education  2024 [employer].

## 2023-03-06 NOTE — H&P (Signed)
Office Visit 02/18/2023 [facility] at [facility], Roberts Gaudy, MD Cardiology Persistent atrial fibrillation [facility]) Dx Referred by Eustace Pen, PA-C Reason for Visit   Additional Documentation  Vitals: BP 112/68   Pulse 93   Ht 5' (1.524 m)   Wt 92.1 kg   SpO2 92%   BMI 39.65 kg/m   BSA 1.97 m  Flowsheets: NEWS,   MEWS Score,   Vital Signs,   Anthropometrics,   Data  Encounter Info: Billing Info,   History,   Allergies,   Detailed Report   All Notes   Progress Notes by Maurice Small, MD at 02/18/2023 12:00 PM  Author: Maurice Small, MD Author Type: Physician Filed: 02/18/2023  5:32 PM  Note Status: Signed Cosign: Cosign Not Required Encounter Date: 02/18/2023  Editor: Maurice Small, MD (Physician)             Expand All Collapse All  Electrophysiology Office Note:     Date:  02/18/2023    ID:  Elizabeth Hebert, DOB 11-09-1945, MRN [id number]   PCP:  [name], MD              [facility] Providers Cardiologist:  None      Referring MD: Eustace Pen, PA-C    History of Present Illness:     Elizabeth Hebert is a 77 y.o. female with a medical history significant for persistent atrial fibrillation, coronary disease status post MI 1999, Crohn's disease, HFpEF referred for arrhythmia management.     Narrative History She presented in the fall 2022 with heart failure symptoms --dyspnea and edema.  Echo showed normal EF but the atria were severely dilated.  She returned in atrial fibrillation and underwent DC cardioversion in December 2022.  She had recurrence of atrial fibrillation and repeat cardioversion in July 2023.  The following month she was back in atrial fibrillation and started on Multaq.  Cardioversion was performed in September 2023.  After cardioversion, she did have some improvement in shortness of breath. She was again noted to be in AF in May 2024. A monitor showed that her AF  burden is 100%.       She is extremely symptomatic with fatigue; she reports these symptoms improved transiently after her cardioversions.   EKGs/Labs/Other Studies Reviewed Today:     Echocardiogram:   TTE December 07, 2021 EF 60 to 65%.  Moderate concentric left ventricular hypertrophy.  Severely dilated left atrium.  Mild mitral valve regurgitation.     Monitors:   3-day Zio patch June 2024 -- my interpretation On her percent atrial fibrillation burden rate 50-140, average 82 bpm   Stress testing:     Advanced imaging:     Cardiac catherization       EKG:            Physical Exam:     VS:  BP 112/68   Pulse 93   Ht 5' (1.524 m)   Wt 203 lb (92.1 kg)   SpO2 92%   BMI 39.65 kg/m         Wt Readings from Last 3 Encounters:  02/18/23 203 lb (92.1 kg)  12/30/22 196 lb 6.4 oz (89.1 kg)  10/10/22 185 lb (83.9 kg)      GEN: Well nourished, well developed in no acute distress CARDIAC: iRRR, no murmurs, rubs, gallops RESPIRATORY:  Normal work of breathing MUSCULOSKELETAL: no edema       ASSESSMENT &  PLAN:     Persistent atrial fibrillation Symptomatic with shortness of breath, fatigue S/p multiple cardioversions Failed flecainide Will try amiodarone -- slow oral load due to baseline Qtc prolongation Will plan for cardioversion in 1-2 weeks Will re-evaluate QT after cardioversion in sinus rhythm. She did not tolerate multaq due to GI distress. I advised her to DC amiodarone if she does not tolerate it If she fails amiodarone, I think we will need to consider pacer/AVJ -- she may feel better off high doses of metoprolol    Secondary hypercoagulable state CHADSc-Vasc is 6 Continue apixaban 5mg            Signed, Maurice Small, MD  02/18/2023 12:56 PM    [facility]       For DCCV; compliant with apixaban; no changes. Olga Millers

## 2023-03-06 NOTE — Procedures (Signed)
Electrical Cardioversion Procedure Note Elizabeth Hebert [id number] 03/24/1946  Procedure: Electrical Cardioversion Indications:  Atrial Fibrillation  Procedure Details Consent: Risks of procedure as well as the alternatives and risks of each were explained to the (patient/caregiver).  Consent for procedure obtained. Time Out: Verified patient identification, verified procedure, site/side was marked, verified correct patient position, special equipment/implants available, medications/allergies/relevent history reviewed, required imaging and test results available.  Performed  Patient placed on cardiac monitor, pulse oximetry, supplemental oxygen as necessary.  Sedation given:  Pt sedated by anesthesia with lidocaine 60 mg and diprovan 50 mg IV. Pacer pads placed anterior and posterior chest.  Cardioverted 1 time(s).  Cardioverted at 200J.  Evaluation Findings: Post procedure EKG shows: NSR Complications: None Patient did tolerate procedure well.   Olga Millers 03/06/2023, 12:07 PM

## 2023-03-07 ENCOUNTER — Encounter (HOSPITAL_COMMUNITY): Payer: Self-pay | Admitting: Cardiology

## 2023-04-07 LAB — LAB REPORT - SCANNED
A1c: 7.8
EGFR: 43

## 2023-04-21 ENCOUNTER — Encounter: Payer: Self-pay | Admitting: Cardiovascular Disease

## 2023-04-21 ENCOUNTER — Ambulatory Visit: Payer: Medicare Other | Attending: Cardiovascular Disease | Admitting: Cardiovascular Disease

## 2023-04-21 VITALS — BP 116/64 | HR 76 | Ht 60.05 in | Wt 203.0 lb

## 2023-04-21 DIAGNOSIS — I4819 Other persistent atrial fibrillation: Secondary | ICD-10-CM | POA: Diagnosis not present

## 2023-04-21 NOTE — Patient Instructions (Signed)
Medication Instructions:  STOP Amiodarone *If you need a refill on your cardiac medications before your next appointment, please call your pharmacy*   Testing/Procedures: Atrial Fibrillation Ablation Your physician has recommended that you have an ablation. Catheter ablation is a medical procedure used to treat some cardiac arrhythmias (irregular heartbeats). During catheter ablation, a long, thin, flexible tube is put into a blood vessel in your groin (upper thigh), or neck. This tube is called an ablation catheter. It is then guided to your heart through the blood vessel. Radio frequency waves destroy small areas of heart tissue where abnormal heartbeats may cause an arrhythmia to start. Please see the instruction sheet given to you today.   Follow-Up: At [facility], you and your health needs are our priority.  As part of our continuing mission to provide you with exceptional heart care, we have created designated Provider Care Teams.  These Care Teams include your primary Cardiologist (physician) and Advanced Practice Providers (APPs -  Physician Assistants and Nurse Practitioners) who all work together to provide you with the care you need, when you need it.  We recommend signing up for the patient portal called "MyChart".  Sign up information is provided on this After Visit Summary.  MyChart is used to connect with patients for Virtual Visits (Telemedicine).  Patients are able to view lab/test results, encounter notes, upcoming appointments, etc.  Non-urgent messages can be sent to your provider as well.   To learn more about what you can do with MyChart, go to [email or web address].    Your next appointment:   07/22/23  Provider:   York Pellant, MD

## 2023-04-21 NOTE — Progress Notes (Signed)
Electrophysiology Office Note:    Date:  04/21/2023   ID:  Elizabeth Hebert, DOB May 06, 1946, MRN [id number]  PCP:  [name], MD   [facility] Providers Cardiologist:  None Electrophysiologist:  Maurice Small, MD     Referring MD: [name], MD   History of Present Illness:    Elizabeth Hebert is a 77 y.o. female with a medical history significant for persistent atrial fibrillation, coronary disease status post MI 1999, Crohn's disease, HFpEF referred for arrhythmia management.     She presented in the fall 2022 with heart failure symptoms --dyspnea and edema.  Echo showed normal EF but the atria were severely dilated.  She returned in atrial fibrillation and underwent DC cardioversion in December 2022.  She had recurrence of atrial fibrillation and repeat cardioversion in July 2023.  The following month she was back in atrial fibrillation and started on Multaq.  Cardioversion was performed in September 2023.  After cardioversion, she did have some improvement in shortness of breath. She was again noted to be in AF in May 2024. A monitor showed that her AF burden is 100%.  She underwent amiodarone load and then DC cardioversion on March 06, 2023.  Unfortunately, she had recurrence of atrial fibrillation approximate Levan days later.  During the period after the ablation, she continued to have fatigue and shortness of breath.     She is extremely symptomatic with fatigue; she reports these symptoms improved transiently after her cardioversions.  EKGs/Labs/Other Studies Reviewed Today:    Echocardiogram:  TTE December 07, 2021 EF 60 to 65%.  Moderate concentric left ventricular hypertrophy.  Severely dilated left atrium.  Mild mitral valve regurgitation.   Monitors:  3-day Zio patch June 2024 -- my interpretation On her percent atrial fibrillation burden rate 50-140, average 82 bpm  Stress testing:   Advanced imaging:   Cardiac  catherization    EKG:   EKG Interpretation Date/Time:  Monday April 21 2023 14:27:20 EST Ventricular Rate:  76 PR Interval:    QRS Duration:  98 QT Interval:  486 QTC Calculation: 546 R Axis:   56  Text Interpretation: Atrial fibrillation Prolonged QT When compared with ECG of 06-Mar-2023 12:58, Atrial fibrillation has replaced Sinus rhythm Confirmed by York Pellant [id number]) on 04/21/2023 2:35:36 PM     Physical Exam:    VS:  BP 116/64   Pulse 76   Ht 5' 0.05" (1.525 m)   Wt 203 lb (92.1 kg)   SpO2 94%   BMI 39.58 kg/m     Wt Readings from Last 3 Encounters:  04/21/23 203 lb (92.1 kg)  03/06/23 195 lb (88.5 kg)  02/18/23 203 lb (92.1 kg)     GEN: Well nourished, well developed in no acute distress CARDIAC: iRRR, no murmurs, rubs, gallops RESPIRATORY:  Normal work of breathing MUSCULOSKELETAL: no edema    ASSESSMENT & PLAN:    Persistent atrial fibrillation Symptomatic with shortness of breath, fatigue S/p multiple cardioversions Failed flecainide, amiodarone Discontinue amiodarone We discussed management options at length.  She desperately wants to pursue some intervention, which at this point would mean either atrial fibrillation ablation or AVJ ablation with pacemaker.  After long discussion, we made the joint decision to try atrial fibrillation ablation prior to making her pacemaker dependent.  We discussed the indication, rationale, logistics, anticipated benefits, and potential risks of the ablation procedure including but not limited to -- bleed at the groin access site, chest pain, need for a drainage  tube, or prolonged hospitalization. I explained that the risk for stroke, heart attack, need for open chest surgery, or even death is very low but not zero. I explained that due to her age and BMI, she is at higher than average risk for complications. she expressed understanding and wishes to proceed.   Secondary hypercoagulable state CHADSc-Vasc is  6 Continue apixaban 5mg       Signed, Maurice Small, MD  04/21/2023 3:01 PM    [facility]

## 2023-04-25 ENCOUNTER — Other Ambulatory Visit: Payer: Self-pay | Admitting: Internal Medicine

## 2023-04-29 ENCOUNTER — Other Ambulatory Visit: Payer: Self-pay

## 2023-04-29 NOTE — Progress Notes (Signed)
Added Ozempic and Toujeo to patient medication list per patient request.   Hi Dr. Jari Pigg, This patient Elizabeth Hebert [birth year] [phone] has requested a chart correction.  Please see below the chart correction and respond to the patient.

## 2023-05-07 ENCOUNTER — Institutional Professional Consult (permissible substitution): Payer: Medicare Other | Admitting: Cardiology

## 2023-05-08 ENCOUNTER — Ambulatory Visit
Admission: RE | Admit: 2023-05-08 | Discharge: 2023-05-08 | Disposition: A | Discharge status: Home / Self Care | Payer: Medicare Other | Source: Ambulatory Visit | Attending: Family Medicine | Admitting: Family Medicine

## 2023-05-08 DIAGNOSIS — M810 Age-related osteoporosis without current pathological fracture: Secondary | ICD-10-CM

## 2023-05-14 ENCOUNTER — Telehealth: Payer: Self-pay

## 2023-05-14 DIAGNOSIS — I4819 Other persistent atrial fibrillation: Secondary | ICD-10-CM

## 2023-05-14 NOTE — Telephone Encounter (Signed)
Spoke with patient, moving ablation up to 06/06/23. Labs to be completed in office on 05/19/23, CT on 12/24 at 1230. Sent new pre-cert message. Letters sent thru MyChart. Patient has no questions at this time.

## 2023-05-19 LAB — BASIC METABOLIC PANEL
BUN/Creatinine Ratio: 12 (ref 12–28)
BUN: 18 mg/dL (ref 8–27)
CO2: 29 mmol/L (ref 20–29)
Calcium: 9.7 mg/dL (ref 8.7–10.3)
Chloride: 95 mmol/L — ABNORMAL LOW (ref 96–106)
Creatinine, Ser: 1.47 mg/dL — ABNORMAL HIGH (ref 0.57–1.00)
Glucose: 129 mg/dL — ABNORMAL HIGH (ref 70–99)
Potassium: 3.2 mmol/L — ABNORMAL LOW (ref 3.5–5.2)
Sodium: 140 mmol/L (ref 134–144)
eGFR: 36 mL/min/{1.73_m2} — ABNORMAL LOW (ref >59)

## 2023-05-19 LAB — CBC
Hematocrit: 43.7 % (ref 34.0–46.6)
Hemoglobin: 14.3 g/dL (ref 11.1–15.9)
MCH: 29.1 pg (ref 26.6–33.0)
MCHC: 32.7 g/dL (ref 31.5–35.7)
MCV: 89 fL (ref 79–97)
Platelets: 237 10*3/uL (ref 150–450)
RBC: 4.91 x10E6/uL (ref 3.77–5.28)
RDW: 15.4 % (ref 11.7–15.4)
WBC: 9.9 10*3/uL (ref 3.4–10.8)

## 2023-05-27 ENCOUNTER — Ambulatory Visit (HOSPITAL_COMMUNITY)
Admission: RE | Admit: 2023-05-27 | Discharge: 2023-05-27 | Disposition: A | Discharge status: Home / Self Care | Payer: Medicare Other | Source: Ambulatory Visit | Attending: Cardiovascular Disease | Admitting: Cardiovascular Disease

## 2023-05-27 DIAGNOSIS — I4819 Other persistent atrial fibrillation: Secondary | ICD-10-CM | POA: Diagnosis present

## 2023-05-27 MED ORDER — IOHEXOL 350 MG/ML SOLN
95.0000 mL | Freq: Once | INTRAVENOUS | Status: AC | PRN
  Administered 2023-05-27: 95 mL via INTRAVENOUS

## 2023-06-02 ENCOUNTER — Encounter: Payer: Self-pay | Admitting: General Practice

## 2023-06-05 ENCOUNTER — Telehealth: Payer: Self-pay

## 2023-06-05 NOTE — Telephone Encounter (Signed)
 Spoke to pt and moved her Afib Ablation out from 1/3 to 1/21 with Dr. Nelly Laurence due to Equipment being down in the Cath Lab.   She will go to the [place] office for updated labs.   I will update her Instruction letter and send via MyChart.

## 2023-06-15 NOTE — Pre-Procedure Instructions (Signed)
 Spoke with patient regarding procedure on 1/20 with anesthesia.  Anesthesia requires the Ozempic to be held for 7 days before procedure.  Her last dose will be today until after procedure.

## 2023-06-19 ENCOUNTER — Telehealth: Payer: Self-pay | Admitting: *Deleted

## 2023-06-19 DIAGNOSIS — Z01812 Encounter for preprocedural laboratory examination: Secondary | ICD-10-CM

## 2023-06-19 NOTE — Telephone Encounter (Signed)
Pt came in to get blood work prior to Ablation. Orders were not in chart so ordered BMP and CBC

## 2023-06-20 LAB — CBC WITH DIFFERENTIAL/PLATELET
Basophils Absolute: 0.1 10*3/uL (ref 0.0–0.2)
Basos: 0 %
EOS (ABSOLUTE): 0 10*3/uL (ref 0.0–0.4)
Eos: 0 %
Hematocrit: 42.8 % (ref 34.0–46.6)
Hemoglobin: 13.6 g/dL (ref 11.1–15.9)
Immature Grans (Abs): 0.1 10*3/uL (ref 0.0–0.1)
Immature Granulocytes: 1 %
Lymphocytes Absolute: 0.9 10*3/uL (ref 0.7–3.1)
Lymphs: 8 %
MCH: 29 pg (ref 26.6–33.0)
MCHC: 31.8 g/dL (ref 31.5–35.7)
MCV: 91 fL (ref 79–97)
Monocytes Absolute: 0.7 10*3/uL (ref 0.1–0.9)
Monocytes: 6 %
Neutrophils Absolute: 9.5 10*3/uL — ABNORMAL HIGH (ref 1.4–7.0)
Neutrophils: 85 %
Platelets: 224 10*3/uL (ref 150–450)
RBC: 4.69 x10E6/uL (ref 3.77–5.28)
RDW: 14 % (ref 11.7–15.4)
WBC: 11.2 10*3/uL — ABNORMAL HIGH (ref 3.4–10.8)

## 2023-06-20 LAB — BASIC METABOLIC PANEL
BUN/Creatinine Ratio: 9 — ABNORMAL LOW (ref 12–28)
BUN: 11 mg/dL (ref 8–27)
CO2: 22 mmol/L (ref 20–29)
Calcium: 9.4 mg/dL (ref 8.7–10.3)
Chloride: 101 mmol/L (ref 96–106)
Creatinine, Ser: 1.28 mg/dL — ABNORMAL HIGH (ref 0.57–1.00)
Glucose: 266 mg/dL — ABNORMAL HIGH (ref 70–99)
Potassium: 3.9 mmol/L (ref 3.5–5.2)
Sodium: 140 mmol/L (ref 134–144)
eGFR: 43 mL/min/{1.73_m2} — ABNORMAL LOW (ref >59)

## 2023-06-23 NOTE — Pre-Procedure Instructions (Signed)
Spoke with patient's daughter Alvino Chapel.  Reviewed the following items: Arrival time 1000 Nothing to eat or drink after midnight No meds AM of procedure Responsible person to drive you home and stay with you for 24 hrs  Have you missed any doses of anti-coagulant Eliquis- takes twice a day, hasn't missed any doses.  Don't take dose in the morning.

## 2023-06-24 ENCOUNTER — Ambulatory Visit (HOSPITAL_BASED_OUTPATIENT_CLINIC_OR_DEPARTMENT_OTHER): Payer: Medicare Other

## 2023-06-24 ENCOUNTER — Observation Stay (HOSPITAL_COMMUNITY)
Admission: RE | Admit: 2023-06-24 | Discharge: 2023-06-25 | Disposition: A | Discharge status: Home / Self Care | Payer: Medicare Other | Attending: Cardiovascular Disease | Admitting: Cardiovascular Disease

## 2023-06-24 ENCOUNTER — Other Ambulatory Visit: Payer: Self-pay

## 2023-06-24 ENCOUNTER — Encounter (HOSPITAL_COMMUNITY)
Admission: RE | Disposition: A | Discharge status: Home / Self Care | Payer: Medicare Other | Source: Home / Self Care | Attending: Cardiovascular Disease

## 2023-06-24 ENCOUNTER — Ambulatory Visit (HOSPITAL_COMMUNITY): Payer: Medicare Other

## 2023-06-24 DIAGNOSIS — I10 Essential (primary) hypertension: Secondary | ICD-10-CM | POA: Diagnosis not present

## 2023-06-24 DIAGNOSIS — K5090 Crohn's disease, unspecified, without complications: Secondary | ICD-10-CM | POA: Insufficient documentation

## 2023-06-24 DIAGNOSIS — I4819 Other persistent atrial fibrillation: Principal | ICD-10-CM | POA: Insufficient documentation

## 2023-06-24 DIAGNOSIS — I2510 Atherosclerotic heart disease of native coronary artery without angina pectoris: Secondary | ICD-10-CM | POA: Diagnosis not present

## 2023-06-24 DIAGNOSIS — I4891 Unspecified atrial fibrillation: Secondary | ICD-10-CM | POA: Diagnosis not present

## 2023-06-24 DIAGNOSIS — Z87891 Personal history of nicotine dependence: Secondary | ICD-10-CM

## 2023-06-24 DIAGNOSIS — D6869 Other thrombophilia: Secondary | ICD-10-CM | POA: Diagnosis not present

## 2023-06-24 DIAGNOSIS — I5030 Unspecified diastolic (congestive) heart failure: Secondary | ICD-10-CM | POA: Insufficient documentation

## 2023-06-24 DIAGNOSIS — I5032 Chronic diastolic (congestive) heart failure: Secondary | ICD-10-CM | POA: Diagnosis not present

## 2023-06-24 HISTORY — PX: ATRIAL FIBRILLATION ABLATION: EP1191

## 2023-06-24 LAB — POCT ACTIVATED CLOTTING TIME: Activated Clotting Time: 320 s

## 2023-06-24 LAB — GLUCOSE, CAPILLARY
Glucose-Capillary: 111 mg/dL — ABNORMAL HIGH (ref 70–99)
Glucose-Capillary: 90 mg/dL (ref 70–99)

## 2023-06-24 SURGERY — ATRIAL FIBRILLATION ABLATION
Anesthesia: General

## 2023-06-24 MED ORDER — FLUTICASONE FUROATE-VILANTEROL 100-25 MCG/ACT IN AEPB
1.0000 | INHALATION_SPRAY | Freq: Every day | RESPIRATORY_TRACT | Status: DC
  Administered 2023-06-25: 1 via RESPIRATORY_TRACT
  Filled 2023-06-24 (×2): qty 28

## 2023-06-24 MED ORDER — IRBESARTAN 300 MG PO TABS
300.0000 mg | ORAL_TABLET | Freq: Every day | ORAL | Status: DC
  Administered 2023-06-24 – 2023-06-25 (×2): 300 mg via ORAL
  Filled 2023-06-24 (×2): qty 1

## 2023-06-24 MED ORDER — SERTRALINE HCL 100 MG PO TABS
100.0000 mg | ORAL_TABLET | Freq: Every morning | ORAL | Status: DC
  Administered 2023-06-25: 100 mg via ORAL
  Filled 2023-06-24 (×2): qty 1

## 2023-06-24 MED ORDER — HEPARIN SODIUM (PORCINE) 1000 UNIT/ML IJ SOLN
INTRAMUSCULAR | Status: DC | PRN
  Administered 2023-06-24: 15000 [IU] via INTRAVENOUS

## 2023-06-24 MED ORDER — LEVOTHYROXINE SODIUM 50 MCG PO TABS
50.0000 ug | ORAL_TABLET | Freq: Every day | ORAL | Status: DC
  Administered 2023-06-25: 50 ug via ORAL
  Filled 2023-06-24: qty 1

## 2023-06-24 MED ORDER — ONDANSETRON HCL 4 MG/2ML IJ SOLN
4.0000 mg | Freq: Four times a day (QID) | INTRAMUSCULAR | Status: DC | PRN
  Filled 2023-06-24: qty 2

## 2023-06-24 MED ORDER — ROSUVASTATIN CALCIUM 20 MG PO TABS
20.0000 mg | ORAL_TABLET | Freq: Every morning | ORAL | Status: DC
  Administered 2023-06-24 – 2023-06-25 (×2): 20 mg via ORAL
  Filled 2023-06-24 (×2): qty 1

## 2023-06-24 MED ORDER — FERROUS SULFATE 325 (65 FE) MG PO TABS
325.0000 mg | ORAL_TABLET | Freq: Every evening | ORAL | Status: DC
Start: 2023-06-24 — End: 2023-06-25
  Administered 2023-06-24: 325 mg via ORAL
  Filled 2023-06-24: qty 1

## 2023-06-24 MED ORDER — SEMAGLUTIDE(0.25 OR 0.5MG/DOS) 2 MG/3ML ~~LOC~~ SOPN: 0.5000 mg | PEN_INJECTOR | SUBCUTANEOUS | Status: DC

## 2023-06-24 MED ORDER — POTASSIUM CHLORIDE CRYS ER 20 MEQ PO TBCR
20.0000 meq | EXTENDED_RELEASE_TABLET | Freq: Two times a day (BID) | ORAL | Status: DC
  Administered 2023-06-24 – 2023-06-25 (×2): 20 meq via ORAL
  Filled 2023-06-24 (×2): qty 1

## 2023-06-24 MED ORDER — SODIUM CHLORIDE 0.9% FLUSH
3.0000 mL | Freq: Two times a day (BID) | INTRAVENOUS | Status: DC
  Administered 2023-06-24 – 2023-06-25 (×2): 3 mL via INTRAVENOUS

## 2023-06-24 MED ORDER — ROCURONIUM BROMIDE 10 MG/ML (PF) SYRINGE
PREFILLED_SYRINGE | INTRAVENOUS | Status: DC | PRN
  Administered 2023-06-24: 70 mg via INTRAVENOUS

## 2023-06-24 MED ORDER — PROPOFOL 10 MG/ML IV BOLUS
INTRAVENOUS | Status: DC | PRN
  Administered 2023-06-24: 130 mg via INTRAVENOUS
  Administered 2023-06-24: 30 mg via INTRAVENOUS

## 2023-06-24 MED ORDER — ACETAMINOPHEN 325 MG PO TABS
650.0000 mg | ORAL_TABLET | ORAL | Status: DC | PRN
Start: 2023-06-24 — End: 2023-06-25

## 2023-06-24 MED ORDER — FENTANYL CITRATE (PF) 100 MCG/2ML IJ SOLN
INTRAMUSCULAR | Status: AC
  Filled 2023-06-24: qty 2

## 2023-06-24 MED ORDER — PHENYLEPHRINE HCL-NACL 20-0.9 MG/250ML-% IV SOLN
INTRAVENOUS | Status: DC | PRN
  Administered 2023-06-24 (×2): 20 ug/min via INTRAVENOUS

## 2023-06-24 MED ORDER — PROPOFOL 500 MG/50ML IV EMUL
INTRAVENOUS | Status: DC | PRN
  Administered 2023-06-24 (×2): 50 ug/kg/min via INTRAVENOUS

## 2023-06-24 MED ORDER — DEXAMETHASONE SODIUM PHOSPHATE 10 MG/ML IJ SOLN
INTRAMUSCULAR | Status: DC | PRN
  Administered 2023-06-24: 5 mg via INTRAVENOUS

## 2023-06-24 MED ORDER — SUGAMMADEX SODIUM 200 MG/2ML IV SOLN
INTRAVENOUS | Status: DC | PRN
  Administered 2023-06-24: 200 mg via INTRAVENOUS

## 2023-06-24 MED ORDER — ATROPINE SULFATE 0.4 MG/ML IV SOLN
INTRAVENOUS | Status: DC | PRN
  Administered 2023-06-24: 1 mg via INTRAVENOUS

## 2023-06-24 MED ORDER — INSULIN GLARGINE-YFGN 100 UNIT/ML ~~LOC~~ SOLN
50.0000 [IU] | Freq: Every day | SUBCUTANEOUS | Status: DC
  Administered 2023-06-25: 50 [IU] via SUBCUTANEOUS
  Filled 2023-06-24: qty 0.5

## 2023-06-24 MED ORDER — PHENYLEPHRINE 80 MCG/ML (10ML) SYRINGE FOR IV PUSH (FOR BLOOD PRESSURE SUPPORT)
PREFILLED_SYRINGE | INTRAVENOUS | Status: DC | PRN
  Administered 2023-06-24 (×3): 80 ug via INTRAVENOUS

## 2023-06-24 MED ORDER — LIDOCAINE 2% (20 MG/ML) 5 ML SYRINGE
INTRAMUSCULAR | Status: DC | PRN
  Administered 2023-06-24: 60 mg via INTRAVENOUS

## 2023-06-24 MED ORDER — HYDROCHLOROTHIAZIDE 25 MG PO TABS
25.0000 mg | ORAL_TABLET | Freq: Every day | ORAL | Status: DC
  Filled 2023-06-24 (×2): qty 1

## 2023-06-24 MED ORDER — METOPROLOL SUCCINATE ER 100 MG PO TB24: 100.0000 mg | ORAL_TABLET | Freq: Every day | ORAL | Status: DC

## 2023-06-24 MED ORDER — ALPRAZOLAM 0.25 MG PO TABS: 0.2500 mg | ORAL_TABLET | Freq: Two times a day (BID) | ORAL | Status: DC | PRN

## 2023-06-24 MED ORDER — SODIUM CHLORIDE 0.9 % IV SOLN: INTRAVENOUS | Status: DC

## 2023-06-24 MED ORDER — HEPARIN (PORCINE) IN NACL 1000-0.9 UT/500ML-% IV SOLN
INTRAVENOUS | Status: DC | PRN
  Administered 2023-06-24 (×3): 500 mL

## 2023-06-24 MED ORDER — APIXABAN 5 MG PO TABS
5.0000 mg | ORAL_TABLET | Freq: Two times a day (BID) | ORAL | Status: DC
  Administered 2023-06-24 – 2023-06-25 (×2): 5 mg via ORAL
  Filled 2023-06-24 (×2): qty 1

## 2023-06-24 MED ORDER — EZETIMIBE 10 MG PO TABS
10.0000 mg | ORAL_TABLET | Freq: Every morning | ORAL | Status: DC
  Administered 2023-06-24 – 2023-06-25 (×2): 10 mg via ORAL
  Filled 2023-06-24 (×2): qty 1

## 2023-06-24 MED ORDER — IBANDRONATE SODIUM 150 MG PO TABS: 150.0000 mg | ORAL_TABLET | ORAL | Status: DC

## 2023-06-24 MED ORDER — ALBUTEROL SULFATE (2.5 MG/3ML) 0.083% IN NEBU: 3.0000 mL | INHALATION_SOLUTION | Freq: Four times a day (QID) | RESPIRATORY_TRACT | Status: DC | PRN

## 2023-06-24 MED ORDER — VALSARTAN-HYDROCHLOROTHIAZIDE 320-25 MG PO TABS: 1.0000 | ORAL_TABLET | Freq: Every morning | ORAL | Status: DC

## 2023-06-24 MED ORDER — PROTAMINE SULFATE 10 MG/ML IV SOLN
INTRAVENOUS | Status: DC | PRN
  Administered 2023-06-24: 40 mg via INTRAVENOUS

## 2023-06-24 MED ORDER — ONDANSETRON HCL 4 MG/2ML IJ SOLN
INTRAMUSCULAR | Status: DC | PRN
  Administered 2023-06-24: 4 mg via INTRAVENOUS

## 2023-06-24 MED ORDER — INSULIN GLARGINE (1 UNIT DIAL) 300 UNIT/ML ~~LOC~~ SOPN: 50.0000 [IU] | PEN_INJECTOR | Freq: Every day | SUBCUTANEOUS | Status: DC

## 2023-06-24 MED ORDER — SODIUM CHLORIDE 0.9% FLUSH: 3.0000 mL | INTRAVENOUS | Status: DC | PRN

## 2023-06-24 MED ORDER — FENTANYL CITRATE (PF) 250 MCG/5ML IJ SOLN
INTRAMUSCULAR | Status: DC | PRN
  Administered 2023-06-24: 100 ug via INTRAVENOUS

## 2023-06-24 MED ORDER — FUROSEMIDE 40 MG PO TABS
40.0000 mg | ORAL_TABLET | Freq: Every day | ORAL | Status: DC
  Filled 2023-06-24: qty 1

## 2023-06-24 MED ORDER — CALCIUM CITRATE 950 (200 CA) MG PO TABS
200.0000 mg | ORAL_TABLET | Freq: Every evening | ORAL | Status: DC
  Filled 2023-06-24: qty 1

## 2023-06-24 MED ORDER — IPRATROPIUM BROMIDE 0.06 % NA SOLN: 2.0000 | Freq: Every day | NASAL | Status: DC | PRN

## 2023-06-24 MED ORDER — SODIUM CHLORIDE 0.9 % IV SOLN
250.0000 mL | INTRAVENOUS | Status: DC | PRN
Start: 2023-06-24 — End: 2023-06-25

## 2023-06-24 SURGICAL SUPPLY — 21 items
BAG SNAP BAND KOVER 36X36 (MISCELLANEOUS) IMPLANT
BLANKET WARM UNDERBOD FULL ACC (MISCELLANEOUS) ×2 IMPLANT
CABLE PFA RX CATH CONN (CABLE) IMPLANT
CATH FARAWAVE ABLATION 31 (CATHETERS) IMPLANT
CATH OCTARAY 2.0 F 3-3-3-3-3 (CATHETERS) IMPLANT
CATH SOUNDSTAR ECO 8FR (CATHETERS) IMPLANT
CATH WEBSTER BI DIR CS D-F CRV (CATHETERS) IMPLANT
CLOSURE PERCLOSE PROSTYLE (VASCULAR PRODUCTS) IMPLANT
COVER SWIFTLINK CONNECTOR (BAG) ×2 IMPLANT
DEVICE CLOSURE MYNXGRIP 6/7F (Vascular Products) IMPLANT
GUIDEWIRE INQWIRE 1.5J.035X260 (WIRE) IMPLANT
INQWIRE 1.5J .035X260CM (WIRE) ×1
KIT VERSACROSS CNCT FARADRIVE (KITS) IMPLANT
PACK EP LF (CUSTOM PROCEDURE TRAY) ×2 IMPLANT
PAD DEFIB RADIO PHYSIO CONN (PAD) ×2 IMPLANT
SHEATH DILAT COONS TAPER 22F (SHEATH) IMPLANT
SHEATH FARADRIVE STEERABLE (SHEATH) IMPLANT
SHEATH FAST CATH 12F 12CM (SHEATH) IMPLANT
SHEATH PINNACLE 8F 10CM (SHEATH) IMPLANT
SHEATH PINNACLE 9F 10CM (SHEATH) IMPLANT
SHEATH PROBE COVER 6X72 (BAG) IMPLANT

## 2023-06-24 NOTE — Anesthesia Postprocedure Evaluation (Signed)
Anesthesia Post Note  Patient: Elizabeth Hebert  Procedure(s) Performed: ATRIAL FIBRILLATION ABLATION     Patient location during evaluation: Cath Lab Anesthesia Type: General Level of consciousness: awake and alert Pain management: pain level controlled Vital Signs Assessment: post-procedure vital signs reviewed and stable Respiratory status: spontaneous breathing, nonlabored ventilation and respiratory function stable Cardiovascular status: blood pressure returned to baseline and stable Postop Assessment: no apparent nausea or vomiting Anesthetic complications: no   There were no known notable events for this encounter.  Last Vitals:  Vitals:   06/24/23 1530 06/24/23 1545  BP: 98/64 102/60  Pulse: 72 73  Resp: 19 20  Temp:    SpO2: 95% 98%    Last Pain:  Vitals:   06/24/23 1430  TempSrc: Oral  PainSc:                  Livia Tarr

## 2023-06-24 NOTE — Discharge Instructions (Signed)
Cardiac Ablation, Care After  This sheet gives you information about how to care for yourself after your procedure. Your health care provider may also give you more specific instructions. If you have problems or questions, contact your health care provider. What can I expect after the procedure? After the procedure, it is common to have: Bruising around your puncture site. Tenderness around your puncture site. Skipped heartbeats. If you had an atrial fibrillation ablation, you may have atrial fibrillation during the first several months after your procedure.  Tiredness (fatigue).  Follow these instructions at home: Puncture site care  Follow instructions from your health care provider about how to take care of your puncture site. Make sure you: Remove bandage tomorrow (06/25/23) at 5:00 pm Check your puncture site every day for signs of infection. Check for: Redness, swelling, or pain. Fluid or blood. If your puncture site starts to bleed, lie down on your back, apply firm pressure to the area, and contact your health care provider. Warmth. Pus or a bad smell. A pea or marble sized lump/knot at the site is normal and can take up to three months to resolve.  Driving Do not drive for at least 4 days after your procedure or however long your health care provider recommends. (Do not resume driving if you have previously been instructed not to drive for other health reasons.) Do not drive or use heavy machinery while taking prescription pain medicine. Activity Avoid activities that take a lot of effort for at least 7 days after your procedure. Do not lift anything that is heavier than 5 lb (4.5 kg) for one week.  No sexual activity for 1 week.  Return to your normal activities as told by your health care provider. Ask your health care provider what activities are safe for you. General instructions Take over-the-counter and prescription medicines only as told by your health care provider. Do  not use any products that contain nicotine or tobacco, such as cigarettes and e-cigarettes. If you need help quitting, ask your health care provider. You may shower after removal of dressings, but Do not take baths, swim, or use a hot tub for 1 week.  Do not drink alcohol for 24 hours after your procedure. Keep all follow-up visits as told by your health care provider. This is important. Contact a health care provider if: You have redness, mild swelling, or pain around your puncture site. You have fluid or blood coming from your puncture site that stops after applying firm pressure to the area for 15 minutes. Your puncture site feels warm to the touch. You have pus or a bad smell coming from your puncture site. You have a fever. You have chest pain or discomfort that spreads to your neck, jaw, or arm. You have chest pain that is worse with lying on your back or taking a deep breath. You are sweating a lot. You feel nauseous. You have a fast or irregular heartbeat. You have shortness of breath. You are dizzy or light-headed and feel the need to lie down. You have pain or numbness in the arm or leg closest to your puncture site. Get help right away if: Your puncture site suddenly swells. Lie flat apply pressure to the swelling for 15 minutes Your puncture site is bleeding and the bleeding does not stop after applying firm pressure to the area for 15 minutes These symptoms may represent a serious problem that is an emergency. Do not wait to see if the symptoms will go away.  Get medical help right away. Call your local emergency services (911 in the [place] Do not drive yourself to the hospital. Summary After the procedure, it is normal to have bruising and tenderness at the puncture site in your groin, neck, or forearm. Check your puncture site every day for signs of infection. Get help right away if your puncture site is bleeding and the bleeding does not stop after applying firm pressure to  the area. This is a medical emergency. This information is not intended to replace advice given to you by your health care provider. Make sure you discuss any questions you have with your health care provider.

## 2023-06-24 NOTE — Anesthesia Preprocedure Evaluation (Signed)
Anesthesia Evaluation  Patient identified by MRN, date of birth, ID band Patient awake    Reviewed: Allergy & Precautions, NPO status , Patient's Chart, lab work & pertinent test results  Airway Mallampati: III  TM Distance: >3 FB Neck ROM: Full  Mouth opening: Limited Mouth Opening  Dental  (+) Teeth Intact, Dental Advisory Given, Caps   Pulmonary neg shortness of breath, neg sleep apnea, neg COPD, Recent URI , Resolved, former smoker   breath sounds clear to auscultation       Cardiovascular hypertension, (-) angina + CAD, + Past MI and + Cardiac Stents  + dysrhythmias Atrial Fibrillation + Valvular Problems/Murmurs AS and MR  Rhythm:Regular + Systolic murmurs  1. Left ventricular ejection fraction, by estimation, is 60 to 65%. The  left ventricle has normal function. The left ventricle has no regional  wall motion abnormalities. There is moderate concentric left ventricular  hypertrophy. Left ventricular  diastolic parameters are indeterminate.   2. Right ventricular systolic function is normal. The right ventricular  size is normal. There is mildly elevated pulmonary artery systolic  pressure. The estimated right ventricular systolic pressure is 38.8 mmHg.   3. Left atrial size was severely dilated.   4. The mitral valve is abnormal. Mild mitral valve regurgitation. No  evidence of mitral stenosis. Moderate to severe mitral annular  calcification.   5. The aortic valve is tricuspid. There is severe calcifcation of the  aortic valve. Aortic valve regurgitation is mild. Mild to moderate aortic  valve stenosis. Aortic valve area, by VTI measures 1.34 cm. Aortic valve  mean gradient measures 12.0 mmHg.   6. The inferior vena cava is normal in size with greater than 50%  respiratory variability, suggesting right atrial pressure of 3 mmHg.   7. The patient was in atrial fibrillation.     Neuro/Psych    GI/Hepatic Neg liver ROS,  hiatal hernia,GERD  ,,  Endo/Other  diabetes, Type 2, Insulin Dependent    Renal/GU Renal InsufficiencyRenal disease     Musculoskeletal  (+) Arthritis ,    Abdominal   Peds  Hematology  (+) Blood dyscrasia Lab Results      Component                Value               Date                      WBC                      11.2 (H)            06/19/2023                HGB                      13.6                06/19/2023                HCT                      42.8                06/19/2023                MCV  91                  06/19/2023                PLT                      224                 06/19/2023             eliquis   Anesthesia Other Findings   Reproductive/Obstetrics                              Anesthesia Physical Anesthesia Plan  ASA: 3  Anesthesia Plan: General   Post-op Pain Management: Minimal or no pain anticipated   Induction: Intravenous  PONV Risk Score and Plan: 3 and Ondansetron and Propofol infusion  Airway Management Planned: Oral ETT  Additional Equipment: None  Intra-op Plan:   Post-operative Plan: Extubation in OR  Informed Consent: I have reviewed the patients History and Physical, chart, labs and discussed the procedure including the risks, benefits and alternatives for the proposed anesthesia with the patient or authorized representative who has indicated his/her understanding and acceptance.     Dental advisory given  Plan Discussed with: CRNA  Anesthesia Plan Comments:          Anesthesia Quick Evaluation

## 2023-06-24 NOTE — Anesthesia Procedure Notes (Signed)
Procedure Name: Intubation Date/Time: 06/24/2023 12:30 PM  Performed by: Vena Austria, CRNAPre-anesthesia Checklist: Patient identified, Emergency Drugs available, Suction available, Patient being monitored and Timeout performed Patient Re-evaluated:Patient Re-evaluated prior to induction Oxygen Delivery Method: Circle system utilized Preoxygenation: Pre-oxygenation with 100% oxygen Induction Type: IV induction Ventilation: Mask ventilation without difficulty Laryngoscope Size: McGrath and 3 Grade View: Grade I Tube type: Oral Number of attempts: 1 Airway Equipment and Method: Video-laryngoscopy Placement Confirmation: ETT inserted through vocal cords under direct vision, positive ETCO2, CO2 detector and breath sounds checked- equal and bilateral Secured at: 20 cm Tube secured with: Tape Dental Injury: Teeth and Oropharynx as per pre-operative assessment

## 2023-06-24 NOTE — Progress Notes (Signed)
Report called to Mamata, RN. [phone]- Pt transferred to 4 East room 06 via bed.

## 2023-06-24 NOTE — Transfer of Care (Signed)
Immediate Anesthesia Transfer of Care Note  Patient: Elizabeth Hebert  Procedure(s) Performed: ATRIAL FIBRILLATION ABLATION  Patient Location: PACU and Cath Lab  Anesthesia Type:General  Level of Consciousness: awake, alert , and oriented  Airway & Oxygen Therapy: Patient connected to nasal cannula oxygen  Post-op Assessment: Report given to RN  Post vital signs: stable  Last Vitals:  Vitals Value Taken Time  BP    Temp    Pulse    Resp    SpO2      Last Pain:  Vitals:   06/24/23 1036  TempSrc:   PainSc: 0-No pain         Complications: There were no known notable events for this encounter.

## 2023-06-24 NOTE — Progress Notes (Signed)
Patient received from cath lab, patient is alert and oriented*4, V/S obtained, CCMD notified, B/L groins are soft, level 0, no any signs of hematoma, orientation given about the unit, call bell in reach .

## 2023-06-24 NOTE — Progress Notes (Signed)
MD Mealor contacted regarding borderline BP readings (most recent 90/56 MAP 66).  No new orders given, ok with MAP > 65 as long as patient is nonsymptomatic.  Pt reports feeling well, no dizziness, no headache, no chest pain.  Reports feeling slightly tired.  Pt AO x 4.  Spoke with MD regarding +3 edema and pt's normal lasix dosage.  Per MD, no Lasix to be given while BP is borderline.  Will continue to monitor.

## 2023-06-24 NOTE — H&P (Signed)
Electrophysiology Office Note:    Date:  06/24/2023   ID:  Elizabeth Hebert, DOB 22-Sep-1945, MRN [id number]  PCP:  [name], MD   [facility] Providers Cardiologist:  None Electrophysiologist:  Maurice Small, MD     Referring MD: No ref. provider found   History of Present Illness:    Elizabeth Hebert is a 78 y.o. female with a medical history significant for persistent atrial fibrillation, coronary disease status post MI 1999, Crohn's disease, HFpEF referred for arrhythmia management.     She presented in the fall 2022 with heart failure symptoms --dyspnea and edema.  Echo showed normal EF but the atria were severely dilated.  She returned in atrial fibrillation and underwent DC cardioversion in December 2022.  She had recurrence of atrial fibrillation and repeat cardioversion in July 2023.  The following month she was back in atrial fibrillation and started on Multaq.  Cardioversion was performed in September 2023.  After cardioversion, she did have some improvement in shortness of breath. She was again noted to be in AF in May 2024. A monitor showed that her AF burden is 100%.  She underwent amiodarone load and then DC cardioversion on March 06, 2023.  Unfortunately, she had recurrence of atrial fibrillation approximate Levan days later.  During the period after the ablation, she continued to have fatigue and shortness of breath.     She is extremely symptomatic with fatigue; she reports these symptoms improved transiently after her cardioversions.  I reviewed the patient's CT and labs. There was no LAA thrombus. she  has not missed any doses of anticoagulation, and she took her dose last night. There have been no changes in the patient's diagnoses, medications, or condition since our recent clinic visit.   EKGs/Labs/Other Studies Reviewed Today:    Echocardiogram:  TTE December 07, 2021 EF 60 to 65%.  Moderate concentric left ventricular hypertrophy.   Severely dilated left atrium.  Mild mitral valve regurgitation.   Monitors:  3-day Zio patch June 2024 -- my interpretation On her percent atrial fibrillation burden rate 50-140, average 82 bpm  Stress testing:   Advanced imaging:   Cardiac catherization    EKG:         Physical Exam:    VS:  BP 117/78   Pulse 82   Temp 98.6 F (37 C) (Oral)   Resp 17   Ht 5' (1.524 m)   Wt 86.2 kg   SpO2 94%   BMI 37.11 kg/m     Wt Readings from Last 3 Encounters:  06/24/23 86.2 kg  04/21/23 92.1 kg  03/06/23 88.5 kg     GEN: Well nourished, well developed in no acute distress CARDIAC: iRRR, no murmurs, rubs, gallops RESPIRATORY:  Normal work of breathing MUSCULOSKELETAL: no edema    ASSESSMENT & PLAN:    Persistent atrial fibrillation Symptomatic with shortness of breath, fatigue S/p multiple cardioversions Failed flecainide, amiodarone Discontinue amiodarone We discussed management options at length.  She desperately wants to pursue some intervention, which at this point would mean either atrial fibrillation ablation or AVJ ablation with pacemaker.  After long discussion, we made the joint decision to try atrial fibrillation ablation prior to making her pacemaker dependent.  We discussed the indication, rationale, logistics, anticipated benefits, and potential risks of the ablation procedure including but not limited to -- bleed at the groin access site, chest pain, need for a drainage tube, or prolonged hospitalization. I explained that the risk for stroke,  heart attack, need for open chest surgery, or even death is very low but not zero. I explained that due to her age and BMI, she is at higher than average risk for complications. she expressed understanding and wishes to proceed.   Secondary hypercoagulable state CHADSc-Vasc is 6 Continue apixaban 5mg       Signed, Maurice Small, MD  06/24/2023 11:46 AM    [facility]

## 2023-06-25 ENCOUNTER — Other Ambulatory Visit (HOSPITAL_COMMUNITY): Payer: Self-pay

## 2023-06-25 ENCOUNTER — Encounter (HOSPITAL_COMMUNITY): Payer: Self-pay | Admitting: Cardiovascular Disease

## 2023-06-25 DIAGNOSIS — I4819 Other persistent atrial fibrillation: Secondary | ICD-10-CM | POA: Diagnosis not present

## 2023-06-25 DIAGNOSIS — I4891 Unspecified atrial fibrillation: Principal | ICD-10-CM | POA: Diagnosis present

## 2023-06-25 MED ORDER — METOPROLOL SUCCINATE ER 50 MG PO TB24
50.0000 mg | ORAL_TABLET | Freq: Every day | ORAL | 5 refills | Status: DC
  Filled 2023-06-25: qty 30, 30d supply, fill #0

## 2023-06-25 MED ORDER — METOPROLOL SUCCINATE ER 50 MG PO TB24
50.0000 mg | ORAL_TABLET | Freq: Every day | ORAL | Status: DC
  Administered 2023-06-25: 50 mg via ORAL
  Filled 2023-06-25: qty 1

## 2023-06-25 NOTE — Progress Notes (Addendum)
Patient complaint of nausea, denied dizziness and lightheadedness, BP was WNL, PRN zofran given.   Patient's nausea resolved and verbalized she is feeling better and ready to go home, discharge instructions provided, TOC meds given, CCMD notified.   06/25/23 1047  Vitals  BP 123/68  MAP (mmHg) 85  BP Location Right Arm  BP Method Automatic  Patient Position (if appropriate) Sitting  ECG Heart Rate 60  MEWS COLOR  MEWS Score Color Green  MEWS Score  MEWS Temp 0  MEWS Systolic 0  MEWS Pulse 0  MEWS RR 0  MEWS LOC 0  MEWS Score 0

## 2023-06-25 NOTE — Progress Notes (Signed)
Discharge instructions reviewed with pt's daughter. (Pt in bathroom at this time, pt agreed to have instructions reviewed with daughter).  Copy of instructions given to pt/daughter, [facility] has filled script for pt and will be picked up on the way out on discharge.   Pt will be d/c'd via wheelchair with belongings, with her daughter and will be   escorted by hospital volunteer.   Elizebeth Kluesner,RN SWOT   1045--pt is out of the bathroom and feels nauseated, pt sipping on gingerale, pt's nurse is going to give some IV zofran. Will plan to d/c IV and telemetry once pt's nausea is subsided. Pt and daughter feel that the nausea is coming from all the medications she took at once this am.

## 2023-06-25 NOTE — Discharge Summary (Signed)
ELECTROPHYSIOLOGY PROCEDURE DISCHARGE SUMMARY    Patient ID: Elizabeth Hebert,  MRN: [id number], DOB/AGE: 1946-02-03 78 y.o.  Admit date: 06/24/2023 Discharge date: 06/25/2023  Primary Care Physician: [name], MD\ Primary Cardiologist: Dr. Tenny Craw Electrophysiologist: Dr. Nelly Laurence  Primary Discharge Diagnosis:  persistent Atrial fibrillation      CHA2DS2Vasc is 6, on Eliquis, appropriately dosed  Secondary Discharge Diagnosis:  CAD Chron's disease HFpEF  Procedures This Admission:  1.  Electrophysiology study and radiofrequency catheter ablation on by Dr Nelly Laurence   This study demonstrated  CONCLUSIONS: 1. AF upon presentation.   2. Successful DC cardioversion 3. Successful ablation of all four pulmonary veins with pulsed field energy. 4. Successful ablation of the posterior wall of the left atrium with pulsed field energy 5. No inducible arrhythmias following ablation  6. No early apparent complications.    Brief HPI: Elizabeth Hebert is a 78 y.o. female with a history of persistent atrial fibrillation.  T Risks, benefits, and alternatives to catheter ablation of atrial fibrillation were reviewed with the patient who wished to proceed.    Hospital Course:  The patient was admitted and underwent EPS/RFCA of atrial fibrillation with details as outlined in her procedure report.  They were monitored on telemetry overnight which demonstrated SR.  Groins are  without complication on the day of discharge.  The patient feels well today, denies CP, SOB, site discomfort.  She was examined by Dr. Nelly Laurence and considered to be stable for discharge.  Wound care and restrictions were reviewed with the patient.  The patient will be seen back by the Afib clinic in 4 weeks and Dr Mealor/EP team in 12 weeks for post ablation follow up.   The patient was admitted for observation 2/2 low BPs post procedure/anesthesia O2 sats are normal off O2 No changes to her home inhalers/txs Much  improved Will reduce her home dose of metoprolol to 50mg  daily   Physical Exam: Vitals:   06/25/23 0004 06/25/23 0330 06/25/23 0814 06/25/23 0824  BP: 99/77 116/70 119/65   Pulse: 70 65 63   Resp: 20 16 17    Temp: 98.2 F (36.8 C) 97.9 F (36.6 C) 98.5 F (36.9 C)   TempSrc: Oral Oral Oral   SpO2: 95% 92% 95% 94%  Weight:      Height:        GEN- The patient is well appearing, alert and oriented x 3 today.   HEENT: normocephalic, atraumatic; sclera clear, conjunctiva pink; hearing intact; oropharynx clear; neck supple  Lungs- CTA b/l, normal work of breathing.  No wheezes, rales, rhonchi Heart- RRR, no murmurs, rubs or gallops  GI- soft, non-tender, non-distended, bowel sounds present  Extremities- no clubbing, cyanosis, or edema; b/l groins are without hematoma/bruit MS- no significant deformity or atrophy Skin- warm and dry, no rash or lesion Psych- euthymic mood, full affect Neuro- strength and sensation are intact   Labs:   Lab Results  Component Value Date   WBC 11.2 (H) 06/19/2023   HGB 13.6 06/19/2023   HCT 42.8 06/19/2023   MCV 91 06/19/2023   PLT 224 06/19/2023    Recent Labs  Lab 06/19/23 1609  NA 140  K 3.9  CL 101  CO2 22  BUN 11  CREATININE 1.28*  CALCIUM 9.4  GLUCOSE 266*     Discharge Medications:  Allergies as of 06/25/2023       Reactions   Catapres [clonidine] Other (See Comments)   Dry mouth   Vasotec [enalaprilat]  Rash        Medication List     TAKE these medications    albuterol 108 (90 Base) MCG/ACT inhaler Commonly known as: VENTOLIN HFA Inhale 2 puffs into the lungs every 6 (six) hours as needed for wheezing.   ALPRAZolam 0.25 MG tablet Commonly known as: XANAX Take 0.25 mg by mouth 2 (two) times daily as needed for anxiety.   apixaban 5 MG Tabs tablet Commonly known as: ELIQUIS Take 5 mg by mouth 2 (two) times daily.   budesonide-formoterol 80-4.5 MCG/ACT inhaler Commonly known as: SYMBICORT Inhale 2 puffs  into the lungs daily as needed (problem with breathing).   calcium citrate 950 (200 Ca) MG tablet Commonly known as: CALCITRATE - dosed in mg elemental calcium Take 200 mg of elemental calcium by mouth every evening.   ezetimibe 10 MG tablet Commonly known as: ZETIA Take 10 mg by mouth in the morning.   ferrous sulfate 325 (65 FE) MG tablet Take 325 mg by mouth every evening.   furosemide 40 MG tablet Commonly known as: LASIX Take one tablet (40 mg) by mouth every other day alternating with two tablets ( 80 mg) on the opposite days What changed:  how much to take how to take this when to take this additional instructions   ibandronate 150 MG tablet Commonly known as: BONIVA Take 150 mg by mouth every 30 (thirty) days.   insulin glargine (1 Unit Dial) 300 UNIT/ML Solostar Pen Commonly known as: TOUJEO Inject 50 Units into the skin daily.   ipratropium 0.06 % nasal spray Commonly known as: ATROVENT Place 2 sprays into both nostrils daily as needed for rhinitis.   Klor-Con M20 20 MEQ tablet Generic drug: potassium chloride SA TAKE 1 TABLET BY MOUTH 3 TIMES DAILY. What changed: when to take this   levothyroxine 50 MCG tablet Commonly known as: SYNTHROID Take 50 mcg by mouth daily before breakfast.   metoprolol succinate 50 MG 24 hr tablet Commonly known as: TOPROL-XL Take 1 tablet (50 mg total) by mouth daily. Take with or immediately following a meal. What changed:  medication strength how much to take when to take this   OneTouch Ultra test strip Generic drug: glucose blood 1 each daily.   Ozempic (0.25 or 0.5 MG/DOSE) 2 MG/3ML Sopn Generic drug: Semaglutide(0.25 or 0.5MG /DOS) Inject 0.5 mg into the skin once a week.   rosuvastatin 20 MG tablet Commonly known as: CRESTOR Take 20 mg by mouth in the morning.   sertraline 100 MG tablet Commonly known as: ZOLOFT Take 100 mg by mouth in the morning.   valsartan-hydrochlorothiazide 320-25 MG tablet Commonly  known as: DIOVAN-HCT Take 1 tablet by mouth in the morning.        Disposition:  Discharge Instructions     Diet - low sodium heart healthy   Complete by: As directed    Increase activity slowly   Complete by: As directed         Duration of Discharge Encounter: Greater than 30 minutes including physician time.  Norma Fredrickson, PA-C 06/25/2023 9:30 AM

## 2023-06-26 MED FILL — Fentanyl Citrate Preservative Free (PF) Inj 100 MCG/2ML: INTRAMUSCULAR | Qty: 2 | Status: AC

## 2023-07-04 ENCOUNTER — Ambulatory Visit (HOSPITAL_COMMUNITY): Payer: Medicare Other | Admitting: Internal Medicine

## 2023-07-21 ENCOUNTER — Other Ambulatory Visit (HOSPITAL_COMMUNITY): Payer: Medicare Other

## 2023-07-22 ENCOUNTER — Ambulatory Visit (HOSPITAL_COMMUNITY)
Admission: RE | Admit: 2023-07-22 | Discharge: 2023-07-22 | Discharge status: Home / Self Care | Payer: Medicare Other | Source: Ambulatory Visit | Attending: Internal Medicine | Admitting: Internal Medicine

## 2023-07-22 ENCOUNTER — Ambulatory Visit: Payer: Medicare Other | Admitting: Cardiovascular Disease

## 2023-07-22 VITALS — BP 88/58 | HR 74 | Ht 60.0 in | Wt 195.8 lb

## 2023-07-22 DIAGNOSIS — D6869 Other thrombophilia: Secondary | ICD-10-CM | POA: Diagnosis not present

## 2023-07-22 DIAGNOSIS — I5032 Chronic diastolic (congestive) heart failure: Secondary | ICD-10-CM | POA: Insufficient documentation

## 2023-07-22 DIAGNOSIS — Z7901 Long term (current) use of anticoagulants: Secondary | ICD-10-CM | POA: Insufficient documentation

## 2023-07-22 DIAGNOSIS — Z7985 Long-term (current) use of injectable non-insulin antidiabetic drugs: Secondary | ICD-10-CM | POA: Insufficient documentation

## 2023-07-22 DIAGNOSIS — I4819 Other persistent atrial fibrillation: Secondary | ICD-10-CM | POA: Insufficient documentation

## 2023-07-22 DIAGNOSIS — D649 Anemia, unspecified: Secondary | ICD-10-CM | POA: Insufficient documentation

## 2023-07-22 DIAGNOSIS — K5090 Crohn's disease, unspecified, without complications: Secondary | ICD-10-CM | POA: Insufficient documentation

## 2023-07-22 DIAGNOSIS — Z794 Long term (current) use of insulin: Secondary | ICD-10-CM | POA: Insufficient documentation

## 2023-07-22 DIAGNOSIS — R0602 Shortness of breath: Secondary | ICD-10-CM | POA: Diagnosis not present

## 2023-07-22 DIAGNOSIS — I959 Hypotension, unspecified: Secondary | ICD-10-CM | POA: Insufficient documentation

## 2023-07-22 DIAGNOSIS — E119 Type 2 diabetes mellitus without complications: Secondary | ICD-10-CM | POA: Diagnosis not present

## 2023-07-22 DIAGNOSIS — I2510 Atherosclerotic heart disease of native coronary artery without angina pectoris: Secondary | ICD-10-CM | POA: Diagnosis not present

## 2023-07-22 DIAGNOSIS — I110 Hypertensive heart disease with heart failure: Secondary | ICD-10-CM | POA: Insufficient documentation

## 2023-07-22 DIAGNOSIS — I252 Old myocardial infarction: Secondary | ICD-10-CM | POA: Insufficient documentation

## 2023-07-22 NOTE — Progress Notes (Signed)
 Primary Care Physician: [name], MD Primary Cardiologist: Dr. Tenny Craw Electrophysiologist: Maurice Small, MD     Referring Physician: Dr. Terisa Starr Elizabeth Hebert is a 78 y.o. female with a history of HTN, CAD s/p MI 1999, T2DM, GERD, Crohn's disease, anemia, HFpEF, and persistent atrial fibrillation who presents for consultation in the [facility]. Seen by Dr. Tenny Craw on 5/9 and noted to be in Afib; wore cardiac monitor which showed 100% Afib burden and sent to Afib clinic to discuss Tikosyn admission. Patient is on Eliquis 5 mg BID for a CHADS2VASC score of 6.  On evaluation today, patient is currently in rate controlled Afib. She feels tired when in Afib. She has not missed any doses of Eliquis.   Addendum: She notes SOB when walking several steps and history of LE edema for which takes lasix.   On follow up 07/22/23, patient is currently in NSR. S/p Afib ablation on 06/24/23 by Dr. Nelly Laurence. No episodes of Afib since ablation. She feels better but still notes some fatigue and SOB with activity. Patient had a Crohn's flare this morning and notes BP to be low today because she did not drink any fluids. Her BP at home usually runs ~117 systolic. No chest pain. Leg sites healed without issue. No missed doses of anticoagulant.  Today, she denies symptoms of orthopnea, PND, lower extremity edema, dizziness, presyncope, syncope, snoring, daytime somnolence, bleeding, or neurologic sequela. The patient is tolerating medications without difficulties and is otherwise without complaint today.    she has a BMI of Body mass index is 38.24 kg/m.Marland Kitchen Filed Weights   07/22/23 1115  Weight: 88.8 kg     Current Outpatient Medications  Medication Sig Dispense Refill   albuterol (VENTOLIN HFA) 108 (90 Base) MCG/ACT inhaler Inhale 2 puffs into the lungs every 6 (six) hours as needed for wheezing.     ALPRAZolam (XANAX) 0.25 MG tablet Take 0.25 mg by mouth 2 (two)  times daily as needed for anxiety.     apixaban (ELIQUIS) 5 MG TABS tablet Take 5 mg by mouth 2 (two) times daily.     budesonide-formoterol (SYMBICORT) 80-4.5 MCG/ACT inhaler Inhale 2 puffs into the lungs daily as needed (problem with breathing).     calcium citrate (CALCITRATE - DOSED IN MG ELEMENTAL CALCIUM) 950 (200 Ca) MG tablet Take 200 mg of elemental calcium by mouth every evening.     ezetimibe (ZETIA) 10 MG tablet Take 10 mg by mouth in the morning.     ferrous sulfate 325 (65 FE) MG tablet Take 325 mg by mouth every evening.     furosemide (LASIX) 40 MG tablet Take one tablet (40 mg) by mouth every other day alternating with two tablets ( 80 mg) on the opposite days (Patient taking differently: Take 40 mg by mouth daily.) 90 tablet 3   insulin glargine, 1 Unit Dial, (TOUJEO) 300 UNIT/ML Solostar Pen Inject 50 Units into the skin daily.     ipratropium (ATROVENT) 0.06 % nasal spray Place 2 sprays into both nostrils daily as needed for rhinitis.     KLOR-CON M20 20 MEQ tablet TAKE 1 TABLET BY MOUTH 3 TIMES DAILY. (Patient taking differently: Take 20 mEq by mouth 2 (two) times daily.) 270 tablet 2   levothyroxine (SYNTHROID) 50 MCG tablet Take 50 mcg by mouth daily before breakfast.     ONETOUCH ULTRA test strip 1 each daily.     rosuvastatin (CRESTOR) 20 MG  tablet Take 20 mg by mouth in the morning.     Semaglutide,0.25 or 0.5MG /DOS, (OZEMPIC, 0.25 OR 0.5 MG/DOSE,) 2 MG/3ML SOPN Inject 0.5 mg into the skin once a week.     sertraline (ZOLOFT) 100 MG tablet Take 100 mg by mouth in the morning.     valsartan-hydrochlorothiazide (DIOVAN-HCT) 320-25 MG per tablet Take 1 tablet by mouth in the morning.  5   cetirizine (ZYRTEC) 10 MG tablet 1 tablet Orally Once a day As needed     cimetidine (TAGAMET HB) 200 MG tablet 1 tablet Orally Twice a day     ibandronate (BONIVA) 150 MG tablet Take 150 mg by mouth every 30 (thirty) days. (Patient not taking: Reported on 07/22/2023)     metoprolol  succinate (TOPROL-XL) 50 MG 24 hr tablet Take 1 tablet (50 mg total) by mouth daily. Take with or immediately following a meal. 30 tablet 5   No current facility-administered medications for this encounter.    Atrial Fibrillation Management history:  Previous antiarrhythmic drugs: Multaq Previous cardioversions: None Previous ablations: 06/24/23 Anticoagulation history: Eliquis   ROS- All systems are reviewed and negative except as per the HPI above.  Physical Exam: BP (!) 88/58   Pulse 74   Ht 5' (1.524 m)   Wt 88.8 kg   BMI 38.24 kg/m   GEN- The patient is well appearing, alert and oriented x 3 today.   Neck - no JVD or carotid bruit noted Lungs- Clear to ausculation bilaterally, normal work of breathing Heart- Regular rate and rhythm, no murmurs, rubs or gallops, PMI not laterally displaced Extremities- no clubbing, cyanosis, or edema Skin - no rash or ecchymosis noted   EKG today demonstrates  Vent. rate 74 BPM PR interval 164 ms QRS duration 88 ms QT/QTcB 452/501 ms P-R-T axes 71 87 42 Normal sinus rhythm Prolonged QT Abnormal ECG When compared with ECG of 24-Jun-2023 14:04, PREVIOUS ECG IS PRESENT.  Echo 12/05/21 demonstrated  1. Left ventricular ejection fraction, by estimation, is 60 to 65%. The  left ventricle has normal function. The left ventricle has no regional  wall motion abnormalities. There is moderate concentric left ventricular  hypertrophy. Left ventricular  diastolic parameters are indeterminate.   2. Right ventricular systolic function is normal. The right ventricular  size is normal. There is mildly elevated pulmonary artery systolic  pressure. The estimated right ventricular systolic pressure is 38.8 mmHg.   3. Left atrial size was severely dilated.   4. The mitral valve is abnormal. Mild mitral valve regurgitation. No  evidence of mitral stenosis. Moderate to severe mitral annular  calcification.   5. The aortic valve is tricuspid. There  is severe calcifcation of the  aortic valve. Aortic valve regurgitation is mild. Mild to moderate aortic  valve stenosis. Aortic valve area, by VTI measures 1.34 cm. Aortic valve  mean gradient measures 12.0 mmHg.   6. The inferior vena cava is normal in size with greater than 50%  respiratory variability, suggesting right atrial pressure of 3 mmHg.   7. The patient was in atrial fibrillation.   Cardiac monitor 11/2022: 100% Afib burden with avg of 82 bpm  ASSESSMENT & PLAN CHA2DS2-VASc Score = 6  The patient's score is based upon: CHF History: 0 HTN History: 1 Diabetes History: 1 Stroke History: 0 Vascular Disease History: 1 Age Score: 2 Gender Score: 1       ASSESSMENT AND PLAN: Persistent Atrial Fibrillation (ICD10:  I48.19) The patient's CHA2DS2-VASc score is 6, indicating  a 9.7% annual risk of stroke.   S/p Afib ablation on 06/24/23 by Dr. Nelly Laurence.  She is currently in NSR.  Previous discussion with Dr. Lalla Brothers that QT interval is prolonged and patient not a candidate for AAD.  Secondary Hypercoagulable State (ICD10:  D68.69) The patient is at significant risk for stroke/thromboembolism based upon her CHA2DS2-VASc Score of 6.  Continue Apixaban (Eliquis).  No missed doses.   Hypotension Drink more fluids and trend BP at home.      Follow up as scheduled with EP.    Lake Bells, PA-C  Afib Clinic [facility] [address] [place], [place] [address] [phone]

## 2023-09-04 ENCOUNTER — Ambulatory Visit: Payer: Medicare Other | Admitting: Student

## 2023-09-08 ENCOUNTER — Other Ambulatory Visit: Payer: Self-pay | Admitting: Family Medicine

## 2023-09-08 DIAGNOSIS — N63 Unspecified lump in unspecified breast: Secondary | ICD-10-CM

## 2023-09-17 DIAGNOSIS — I4581 Long QT syndrome: Secondary | ICD-10-CM | POA: Diagnosis not present

## 2023-09-18 DIAGNOSIS — I351 Nonrheumatic aortic (valve) insufficiency: Secondary | ICD-10-CM | POA: Diagnosis not present

## 2023-09-18 DIAGNOSIS — I340 Nonrheumatic mitral (valve) insufficiency: Secondary | ICD-10-CM | POA: Diagnosis not present

## 2023-09-18 DIAGNOSIS — I361 Nonrheumatic tricuspid (valve) insufficiency: Secondary | ICD-10-CM | POA: Diagnosis not present

## 2023-09-22 ENCOUNTER — Ambulatory Visit: Payer: Medicare Other | Admitting: Student

## 2023-10-02 ENCOUNTER — Telehealth: Payer: Self-pay | Admitting: Cardiovascular Disease

## 2023-10-02 NOTE — Telephone Encounter (Signed)
 Spoke with daughter, Elizabeth Hebert who reports mother is currently in [facility].  She had a fall about 2 weeks ago, broke hip and had surgery and now has pneumonia.  Daughter reports she has continuially gone downhill and will be placed on a ventilator soon.  They do not expect her to recover.   Daughter wants Dr Arlester Ladd to be aware how much she appreciates him and his care.  She reports pt has felt so much better since her surgery.  Advised I will forward this information to Dr Arlester Ladd for his knowledge.   Daughter was appreciative of the call back and assistance.

## 2023-10-02 NOTE — Telephone Encounter (Signed)
 Daughter Willetta Harpin) called to cancel patient's appointment and to report patient is in hospital and will be placed on a ventilator.

## 2023-10-10 ENCOUNTER — Ambulatory Visit: Admitting: Student

## 2023-10-13 ENCOUNTER — Encounter

## 2023-11-02 NOTE — Telephone Encounter (Signed)
 Spoke with daughter Willetta Harpin) patient passed away this morning, offered condolences. Family expressed gratitude for Dr Katheryne Pane care prior to patient passing, states patient had mentioned numerous times on how well she felt since ablation.

## 2023-11-02 DEATH — deceased

## 2023-11-04 ENCOUNTER — Other Ambulatory Visit (HOSPITAL_COMMUNITY): Payer: Self-pay

## 2024-06-21 ENCOUNTER — Other Ambulatory Visit (HOSPITAL_BASED_OUTPATIENT_CLINIC_OR_DEPARTMENT_OTHER): Payer: Self-pay
